# Patient Record
Sex: Male | Born: 1945
Health system: Southern US, Community
[De-identification: ages and names within clinical notes are randomized; demographics above are authoritative.]

## PROBLEM LIST (undated history)

## (undated) DIAGNOSIS — R42 Dizziness and giddiness: Secondary | ICD-10-CM

## (undated) DIAGNOSIS — I480 Paroxysmal atrial fibrillation: Secondary | ICD-10-CM

## (undated) DIAGNOSIS — I77819 Aortic ectasia, unspecified site: Secondary | ICD-10-CM

## (undated) DIAGNOSIS — I712 Thoracic aortic aneurysm, without rupture: Secondary | ICD-10-CM

## (undated) DIAGNOSIS — R1903 Right lower quadrant abdominal swelling, mass and lump: Secondary | ICD-10-CM

## (undated) DIAGNOSIS — S61219A Laceration without foreign body of unspecified finger without damage to nail, initial encounter: Secondary | ICD-10-CM

## (undated) DIAGNOSIS — Q430 Meckel's diverticulum (displaced) (hypertrophic): Secondary | ICD-10-CM

## (undated) DIAGNOSIS — N529 Male erectile dysfunction, unspecified: Secondary | ICD-10-CM

## (undated) DIAGNOSIS — E785 Hyperlipidemia, unspecified: Secondary | ICD-10-CM

## (undated) DIAGNOSIS — N400 Enlarged prostate without lower urinary tract symptoms: Secondary | ICD-10-CM

## (undated) DIAGNOSIS — I10 Essential (primary) hypertension: Secondary | ICD-10-CM

## (undated) DIAGNOSIS — I341 Nonrheumatic mitral (valve) prolapse: Secondary | ICD-10-CM

## (undated) DIAGNOSIS — N451 Epididymitis: Secondary | ICD-10-CM

## (undated) DIAGNOSIS — I77810 Thoracic aortic ectasia: Secondary | ICD-10-CM

## (undated) DIAGNOSIS — Z8679 Personal history of other diseases of the circulatory system: Secondary | ICD-10-CM

## (undated) DIAGNOSIS — I5189 Other ill-defined heart diseases: Secondary | ICD-10-CM

## (undated) DIAGNOSIS — Z9889 Other specified postprocedural states: Secondary | ICD-10-CM

## (undated) DIAGNOSIS — I519 Heart disease, unspecified: Secondary | ICD-10-CM

## (undated) HISTORY — DX: Right lower quadrant abdominal swelling, mass and lump: R19.03

## (undated) HISTORY — PX: APPENDECTOMY: SHX54

## (undated) HISTORY — DX: Other ill-defined heart diseases: I51.89

## (undated) HISTORY — DX: Heart disease, unspecified: I51.9

## (undated) HISTORY — DX: Dizziness and giddiness: R42

## (undated) HISTORY — PX: PILONIDAL CYST EXCISION: SHX744

## (undated) HISTORY — PX: TONSILLECTOMY: SUR1361

## (undated) HISTORY — PX: RECONSTRUCTION OF NOSE: SHX2301

## (undated) HISTORY — DX: Nonrheumatic mitral (valve) prolapse: I34.1

## (undated) HISTORY — DX: Essential (primary) hypertension: I10

## (undated) HISTORY — DX: Aortic ectasia, unspecified site: I77.819

## (undated) HISTORY — DX: Meckel's diverticulum (displaced) (hypertrophic): Q43.0

## (undated) HISTORY — DX: Benign prostatic hyperplasia without lower urinary tract symptoms: N40.0

## (undated) HISTORY — DX: Epididymitis: N45.1

## (undated) HISTORY — DX: Male erectile dysfunction, unspecified: N52.9

## (undated) HISTORY — DX: Thoracic aortic ectasia: I77.810

## (undated) HISTORY — DX: Thoracic aortic aneurysm, without rupture: I71.2

## (undated) HISTORY — DX: Paroxysmal atrial fibrillation: I48.0

---

## 2000-06-15 ENCOUNTER — Encounter: Admission: RE | Admit: 2000-06-15 | Discharge: 2000-06-15 | Payer: Self-pay | Admitting: Internal Medicine

## 2000-06-15 ENCOUNTER — Encounter: Payer: Self-pay | Admitting: Internal Medicine

## 2011-08-29 DIAGNOSIS — N529 Male erectile dysfunction, unspecified: Secondary | ICD-10-CM

## 2011-08-29 HISTORY — DX: Male erectile dysfunction, unspecified: N52.9

## 2012-07-10 ENCOUNTER — Emergency Department (HOSPITAL_COMMUNITY)
Admission: EM | Admit: 2012-07-10 | Discharge: 2012-07-10 | Disposition: A | Discharge status: Home / Self Care | Payer: BC Managed Care – PPO | Attending: Emergency Medicine | Admitting: Emergency Medicine

## 2012-07-10 ENCOUNTER — Emergency Department (HOSPITAL_COMMUNITY): Payer: BC Managed Care – PPO

## 2012-07-10 ENCOUNTER — Encounter (HOSPITAL_COMMUNITY): Payer: Self-pay | Admitting: Nurse Practitioner

## 2012-07-10 DIAGNOSIS — R109 Unspecified abdominal pain: Secondary | ICD-10-CM

## 2012-07-10 DIAGNOSIS — Z79899 Other long term (current) drug therapy: Secondary | ICD-10-CM | POA: Insufficient documentation

## 2012-07-10 DIAGNOSIS — R197 Diarrhea, unspecified: Secondary | ICD-10-CM | POA: Insufficient documentation

## 2012-07-10 DIAGNOSIS — Z8679 Personal history of other diseases of the circulatory system: Secondary | ICD-10-CM | POA: Insufficient documentation

## 2012-07-10 DIAGNOSIS — R1031 Right lower quadrant pain: Secondary | ICD-10-CM | POA: Insufficient documentation

## 2012-07-10 LAB — CBC WITH DIFFERENTIAL/PLATELET
Basophils Absolute: 0 10*3/uL (ref 0.0–0.1)
Basophils Relative: 0 % (ref 0–1)
Eosinophils Absolute: 0.2 10*3/uL (ref 0.0–0.7)
Eosinophils Relative: 1 % (ref 0–5)
HCT: 44.6 % (ref 39.0–52.0)
Hemoglobin: 15.7 g/dL (ref 13.0–17.0)
Lymphocytes Relative: 18 % (ref 12–46)
Lymphs Abs: 2 10*3/uL (ref 0.7–4.0)
MCH: 31 pg (ref 26.0–34.0)
MCHC: 35.2 g/dL (ref 30.0–36.0)
MCV: 88 fL (ref 78.0–100.0)
Monocytes Absolute: 0.7 10*3/uL (ref 0.1–1.0)
Monocytes Relative: 6 % (ref 3–12)
Neutro Abs: 8.3 10*3/uL — ABNORMAL HIGH (ref 1.7–7.7)
Neutrophils Relative %: 74 % (ref 43–77)
Platelets: 210 10*3/uL (ref 150–400)
RBC: 5.07 MIL/uL (ref 4.22–5.81)
RDW: 12.9 % (ref 11.5–15.5)
WBC: 11.1 10*3/uL — ABNORMAL HIGH (ref 4.0–10.5)

## 2012-07-10 LAB — COMPREHENSIVE METABOLIC PANEL
ALT: 15 U/L (ref 0–53)
AST: 17 U/L (ref 0–37)
Albumin: 3.8 g/dL (ref 3.5–5.2)
Alkaline Phosphatase: 59 U/L (ref 39–117)
BUN: 21 mg/dL (ref 6–23)
CO2: 22 mEq/L (ref 19–32)
Calcium: 9.3 mg/dL (ref 8.4–10.5)
Chloride: 102 mEq/L (ref 96–112)
Creatinine, Ser: 0.9 mg/dL (ref 0.50–1.35)
GFR calc Af Amer: >90 mL/min (ref >90)
GFR calc non Af Amer: 88 mL/min — ABNORMAL LOW (ref >90)
Glucose, Bld: 97 mg/dL (ref 70–99)
Potassium: 3.7 mEq/L (ref 3.5–5.1)
Sodium: 138 mEq/L (ref 135–145)
Total Bilirubin: 0.4 mg/dL (ref 0.3–1.2)
Total Protein: 6.7 g/dL (ref 6.0–8.3)

## 2012-07-10 LAB — URINALYSIS, MICROSCOPIC ONLY
Glucose, UA: NEGATIVE mg/dL
Ketones, ur: 15 mg/dL — AB
Leukocytes, UA: NEGATIVE
pH: 5 (ref 5.0–8.0)

## 2012-07-10 MED ORDER — IOHEXOL 300 MG/ML  SOLN
50.0000 mL | Freq: Once | INTRAMUSCULAR | Status: AC | PRN
  Administered 2012-07-10: 50 mL via ORAL

## 2012-07-10 MED ORDER — IOHEXOL 300 MG/ML  SOLN
100.0000 mL | Freq: Once | INTRAMUSCULAR | Status: AC | PRN
  Administered 2012-07-10: 100 mL via INTRAVENOUS

## 2012-07-10 MED ORDER — SODIUM CHLORIDE 0.9 % IV BOLUS (SEPSIS)
1000.0000 mL | Freq: Once | INTRAVENOUS | Status: AC
  Administered 2012-07-10: 1000 mL via INTRAVENOUS

## 2012-07-10 NOTE — ED Provider Notes (Signed)
History     CSN: [id number]  Arrival date & time 07/10/12  1554   First MD Initiated Contact with Patient 07/10/12 1646      Chief Complaint  Patient presents with  . Abdominal Pain    (Consider location/radiation/quality/duration/timing/severity/associated sxs/prior treatment) HPI chief complaint: Abdominal pain. Onset: On waking. Location right lower quadrant. Not improved or worsened by anything. Cortical. No radiation. Severity mild/moderate. Timing constant. For signs and symptoms to review systems. For social history see nurse's notes. No family history of appendicitis, nephrolithiasis or cholecystitis. I reviewed the patient's past medical, past surgical, social history as well as medications and allergies to  Past Medical History  Diagnosis Date  . Mitral valve prolapse     Past Surgical History  Procedure Date  . Tonsillectomy     History reviewed. No pertinent family history.  History  Substance Use Topics  . Smoking status: Never Smoker   . Smokeless tobacco: Not on file  . Alcohol Use: Yes      Review of Systems  Constitutional: Negative for fever and chills.  HENT: Negative for neck pain and neck stiffness.   Eyes: Negative for visual disturbance.  Respiratory: Negative for cough, chest tightness and shortness of breath.   Cardiovascular: Negative for chest pain, palpitations and leg swelling.  Gastrointestinal: Positive for abdominal pain and diarrhea. Negative for nausea, vomiting, constipation, blood in stool and abdominal distention.  Genitourinary: Negative for dysuria, urgency, hematuria, flank pain, decreased urine volume, penile swelling, scrotal swelling, difficulty urinating, penile pain and testicular pain.  Musculoskeletal: Negative for back pain and gait problem.  Skin: Negative for rash.  Neurological: Negative for dizziness, tremors, seizures, syncope, facial asymmetry, speech difficulty, weakness, light-headedness, numbness and headaches.    Hematological: Negative for adenopathy. Does not bruise/bleed easily.  Psychiatric/Behavioral: Negative for confusion.    Allergies  Sulfa antibiotics  Home Medications   Current Outpatient Rx  Name  Route  Sig  Dispense  Refill  . ASPIRIN EC 81 MG PO TBEC   Oral   Take 81 mg by mouth daily.         . IBUPROFEN 200 MG PO TABS   Oral   Take 400 mg by mouth every 6 (six) hours as needed. For pain           BP 142/88  Pulse 67  Temp 98.1 F (36.7 C) (Oral)  Resp 16  SpO2 97%  Physical Exam  Constitutional: He is oriented to person, place, and time. He appears well-developed and well-nourished. No distress.  HENT:  Head: Normocephalic.  Eyes: Conjunctivae normal are normal.  Neck: Normal range of motion. Neck supple.  Cardiovascular: Normal rate, regular rhythm, normal heart sounds and intact distal pulses.   No murmur heard. Pulmonary/Chest: Effort normal and breath sounds normal. No respiratory distress. He has no wheezes. He has no rales.  Abdominal: Soft. Bowel sounds are normal. He exhibits no distension and no mass. There is tenderness. There is no rebound and no guarding. Hernia confirmed negative in the right inguinal area and confirmed negative in the left inguinal area.  Genitourinary: Testes normal and penis normal. Cremasteric reflex is present. Right testis shows no mass, no swelling and no tenderness. Right testis is descended. Cremasteric reflex is not absent on the right side. Left testis shows no mass, no swelling and no tenderness. Left testis is descended. Cremasteric reflex is not absent on the left side. Uncircumcised.  Musculoskeletal: Normal range of motion. He exhibits no edema and  no tenderness.  Lymphadenopathy:       Right: No inguinal adenopathy present.       Left: No inguinal adenopathy present.  Neurological: He is alert and oriented to person, place, and time.  Skin: Skin is warm and dry. He is not diaphoretic.  Psychiatric: He has a  normal mood and affect.    ED Course  Procedures (including critical care time)  Labs Reviewed  CBC WITH DIFFERENTIAL - Abnormal; Notable for the following:    WBC 11.1 (*)     Neutro Abs 8.3 (*)     All other components within normal limits  COMPREHENSIVE METABOLIC PANEL - Abnormal; Notable for the following:    GFR calc non Af Amer 88 (*)     All other components within normal limits  URINALYSIS, MICROSCOPIC ONLY - Abnormal; Notable for the following:    Color, Urine AMBER (*)  BIOCHEMICALS MAY BE AFFECTED BY COLOR   Specific Gravity, Urine 1.041 (*)     Bilirubin Urine SMALL (*)     Ketones, ur 15 (*)     Crystals CA OXALATE CRYSTALS (*)     All other components within normal limits  LIPASE, BLOOD   Ct Abdomen Pelvis W Contrast  07/10/2012  *RADIOLOGY REPORT*  Clinical Data: Abdominal pain, right lower quadrant pain, concern for appendicitis  CT ABDOMEN AND PELVIS WITH CONTRAST  Technique:  Multidetector CT imaging of the abdomen and pelvis was performed following the standard protocol during bolus administration of intravenous contrast.  Contrast: OMNIPAQUE IOHEXOL 300 MG/ML  SOLN  Comparison: None.  Findings: Lung bases are clear.  No pericardial fluid.  There is a low density lesion in the left hepatic lobe with simple fluid attenuation (image 10) with hepatic cyst.  There is smaller likely hepatic cyst within the right lobe (image 25) which cannot be characterized.  The gallbladder is collapsed without inflammation.  The pancreas, spleen, adrenal glands, and left kidney are normal.  There is a low density  cortical lesion in the right kidney with simple fluid attenuation consistent with benign cyst.  The stomach and duodenum are normal.  There is no evidence of small bowel dilatation.  Contrast enters the ascending colon.  The ileocecal valve is normal.  The appendix is identified and demonstrates no inflammation (image 45).  Within the right lower quadrant there is a small soft  tissue nodule within the small bowel mesentery measuring 17 x 23 mm (image 46).  This has a central calcification versus focus of enhancement or intraluminal contrast. The lesion is not clearly contiguous with the bowel.  No additional mesenteric lesions are identified.  The ascending colon is normal. The sigmoid colon contains several diverticula.  The rectum is normal.  Abdominal aorta is normal caliber.  No retroperitoneal periportal lymphadenopathy.  No free fluid the pelvis.  The prostate gland bladder normal.  No pelvic lymphadenopathy. Review of  bone windows demonstrates no aggressive osseous lesions.  There is significant degenerative change of the lumbar spine with endplate spurring and sclerosis. There is benign-appearing sclerotic lesion in the right acetabulum.  IMPRESSION:  1.  No clear explanation for right lower quadrant pain.  No evidence of appendicitis. 2.  There is a small nodule/mass within the right lower quadrant small bowel mesentery. Differential would include neuroendocrine tumor versus a diverticulum.  Recommend correlation with a appropriate tumor markers for neuroendocrine tumor consider non emergent oncology / GI consultation.  3.  Benign-appearing hepatic cyst. 4.  Bosniak one  cyst of the right kidney.   Original Report Authenticated By: Genevive Bi, M.D.      1. Abdominal pain       MDM  Pt is a  67 year old male with 10 hours of focal right lower quadrant abdominal pain with mild nonbloody diarrhea. Afebrile no vomiting. Vital stable. Labs and CT pending. Pain is 6/10 but the patient does not want pain medicine at this time.  CT scan negative for acute right lower quadrant pathology. Repeat abdominal exam benign. Patient continues to appear well. CT scan does comment on a possible neuroendocrine tumor versus a diverticulum. They recommend correlation with tumor markers and palpation oncology or GI consultation. Patient was instructed to followup with his primary care  physician who can arrange this. Return precautions discussed including fever, worsening abdominal pain, vomiting, diarrhea. Patient verbalized understanding.         Consuello Masse, MD 07/10/12 2300

## 2012-07-10 NOTE — ED Notes (Signed)
Pt finished drinking PO contrast.

## 2012-07-10 NOTE — ED Notes (Signed)
Pt states understanding of discharge instructions 

## 2012-07-10 NOTE — ED Notes (Signed)
Pt c/o RLQ abd pain onset this am, describes as sharp and increased with inhalation. Reports diarrhea x 1 this am. Denies n/v. A&Ox4, resp e/u

## 2012-07-10 NOTE — ED Notes (Signed)
CT made aware pt finished drinking contrast

## 2012-07-10 NOTE — ED Provider Notes (Signed)
I saw and evaluated the patient, reviewed the resident's note and I agree with the findings and plan.   .Face to face Exam:  General:  Awake HEENT:  Atraumatic Resp:  Normal effort Abd:  Nondistended Neuro:No focal weakness Lymph: No adenopathy   Nelia Shi, MD 07/10/12 [phone]

## 2012-07-27 ENCOUNTER — Encounter (INDEPENDENT_AMBULATORY_CARE_PROVIDER_SITE_OTHER): Payer: Self-pay | Admitting: General Surgery

## 2012-07-27 ENCOUNTER — Telehealth (INDEPENDENT_AMBULATORY_CARE_PROVIDER_SITE_OTHER): Payer: Self-pay | Admitting: General Surgery

## 2012-07-27 ENCOUNTER — Ambulatory Visit (INDEPENDENT_AMBULATORY_CARE_PROVIDER_SITE_OTHER): Payer: BC Managed Care – PPO | Admitting: General Surgery

## 2012-07-27 ENCOUNTER — Other Ambulatory Visit (INDEPENDENT_AMBULATORY_CARE_PROVIDER_SITE_OTHER): Payer: Self-pay | Admitting: General Surgery

## 2012-07-27 VITALS — BP 160/68 | HR 68 | Temp 97.7°F | Resp 16 | Ht 72.0 in | Wt 202.6 lb

## 2012-07-27 DIAGNOSIS — R1903 Right lower quadrant abdominal swelling, mass and lump: Secondary | ICD-10-CM

## 2012-07-27 HISTORY — DX: Right lower quadrant abdominal swelling, mass and lump: R19.03

## 2012-07-27 NOTE — Telephone Encounter (Signed)
Discussed plan

## 2012-07-27 NOTE — Assessment & Plan Note (Signed)
Diverticulitis versus carcinoid tumor.  This may represent a Meckel's diverticulum. It did seem to improve significantly with the antibiotics which makes me favor diverticulitis of the small bowel as the diagnosis.  We will set him up for laparoscopic exploration and probable diverticulectomy. I advised the patient that if this is a small bowel tumor we would take out a segment of the small bowel and put it back together through a small incision.  And repeat a CT scan prior to surgery to make sure that this has not completely resolved. It may have represented epiploic appendicitis.  I reviewed the risk of surgery including bleeding, infection, damage to adjacent structures, and unexpected findings.

## 2012-07-27 NOTE — Progress Notes (Signed)
Patient ID: Chase L Constancio Jr., male   DOB: 07/11/1945, 66 y.o.   MRN: [id number]  Chief Complaint  Patient presents with  . New Evaluation    eval mesentric mass    HPI Chase L Farnworth Jr. is a 66 y.o. male.   HPI Patient is a 66  Year-old male who presented January 11 with abdominal pain. This was quite sharp it was in the right lower corner. He was concerned that he had appendicitis. In the emergency department CT was negative for appendicitis, but it did show a mass in the right lower quadrant. He was started on antibiotics. He stated that within the first 24-hours, he felt much better.  He continues to have a small nagging right discomfort in the right lower quadrant especially when he is moving or stretching.  He says he feels like something is pulling in that area.  He denies fevers or chills. He denies diarrhea or constipation. He has not had any flushing. He does have occasional headaches but these are unchanged. His father had a family history significant for pancreatic cancer, and his mother had breast cancer.  Past Medical History  Diagnosis Date  . Mitral valve prolapse   . Allergic rhinitis   . Uvulitis 11/2000    acute - responded to augmentin  . Vertigo     episodic mild positional  . Seborrheic keratosis   . Actinic keratoses   . Epididymitis     recurrent  . Mitral valve prolapse 12/1993    no murmur  . BPH (benign prostatic hyperplasia)     mild  . Sigmoid diverticulosis 08/1999    noted on flexible sigmoidoscopy   . Folliculitis   . Heart murmur, systolic 05/2009    aortic and mitral  . Hypertension     in the past - resolved  . Abdominal pain   . Generalized headaches     slight, dull    Past Surgical History  Procedure Date  . Tonsillectomy   . Pilonidal cyst excision   . Reconstruction of nose     secondary to deviated septum    Family History  Problem Relation Age of Onset  . Cancer Mother     breast  . Cancer Father     pancreatic  . Cancer  Maternal Aunt     ? ovarian  . Cancer Maternal Uncle     ? pancreatic  . Cancer Paternal Uncle     ? lung    Social History History  Substance Use Topics  . Smoking status: Never Smoker   . Smokeless tobacco: Never Used  . Alcohol Use: Yes     Comment: one drink per day (wine or beer)    Allergies  Allergen Reactions  . Sulfa Antibiotics Other (See Comments)    Severe lethargy  . Ace Inhibitors Cough  . Antihistamines, Chlorpheniramine-Type Other (See Comments)    Trouble urinating. Confirm type of antihistamine with the patient.     Current Outpatient Prescriptions  Medication Sig Dispense Refill  . aspirin EC 81 MG tablet Take 81 mg by mouth daily.      . ibuprofen (ADVIL,MOTRIN) 200 MG tablet Take 400 mg by mouth every 6 (six) hours as needed. For pain        Review of Systems Review of Systems  Gastrointestinal: Positive for abdominal pain.  Skin: Positive for wound.  Neurological: Positive for headaches.    Blood pressure 160/68, pulse 68, temperature 97.7 F (36.5 C), temperature   source Oral, resp. rate 16, height 6' (1.829 m), weight 202 lb 9.6 oz (91.899 kg).  Physical Exam Physical Exam  Constitutional: He is oriented to person, place, and time. He appears well-developed and well-nourished. No distress.  HENT:  Head: Normocephalic and atraumatic.  Right Ear: External ear normal.  Left Ear: External ear normal.  Mouth/Throat: No oropharyngeal exudate.  Eyes: Pupils are equal, round, and reactive to light. Right eye exhibits no discharge. Left eye exhibits no discharge. No scleral icterus.  Neck: Normal range of motion. Neck supple. No tracheal deviation present. No thyromegaly present.  Cardiovascular: Normal rate, regular rhythm, normal heart sounds and intact distal pulses.  Exam reveals no gallop and no friction rub.   No murmur heard. Pulmonary/Chest: Effort normal and breath sounds normal. No respiratory distress. He has no wheezes. He has no rales.  He exhibits no tenderness.  Abdominal: Soft. Bowel sounds are normal. He exhibits no distension and no mass. There is no tenderness. There is no rebound and no guarding.  Musculoskeletal: Normal range of motion. He exhibits no edema and no tenderness.  Lymphadenopathy:    He has no cervical adenopathy.  Neurological: He is alert and oriented to person, place, and time. Coordination normal.  Skin: Skin is warm and dry. No rash noted. He is not diaphoretic. No erythema. No pallor.  Psychiatric: He has a normal mood and affect. His behavior is normal. Judgment and thought content normal.    Data Reviewed CT There is a low density lesion in the left hepatic lobe with simple  fluid attenuation (image 10) with hepatic cyst. There is smaller  likely hepatic cyst within the right lobe (image 25) which cannot  be characterized. The gallbladder is collapsed without  inflammation. The pancreas, spleen, adrenal glands, and left  kidney are normal. There is a low density cortical lesion in the  right kidney with simple fluid attenuation consistent with benign  cyst.  The stomach and duodenum are normal. There is no evidence of small  bowel dilatation. Contrast enters the ascending colon. The  ileocecal valve is normal. The appendix is identified and  demonstrates no inflammation (image 45). Within the right lower  quadrant there is a small soft tissue nodule within the small bowel  mesentery measuring 17 x 23 mm (image 46). This has a central  calcification versus focus of enhancement or intraluminal contrast.  The lesion is not clearly contiguous with the bowel. No additional  mesenteric lesions are identified. The ascending colon is normal.  The sigmoid colon contains several diverticula. The rectum is  normal.  Abdominal aorta is normal caliber. No retroperitoneal periportal  lymphadenopathy.  No free fluid the pelvis. The prostate gland bladder normal. No  pelvic lymphadenopathy. Review of  bone windows demonstrates no  aggressive osseous lesions. There is significant degenerative  change of the lumbar spine with endplate spurring and sclerosis.  There is benign-appearing sclerotic lesion in the right acetabulum.  IMPRESSION:  1. No clear explanation for right lower quadrant pain. No  evidence of appendicitis.  2. There is a small nodule/mass within the right lower quadrant  small bowel mesentery. Differential would include neuroendocrine  tumor versus a diverticulum. Recommend correlation with a  appropriate tumor markers for neuroendocrine tumor consider non  emergent oncology / GI consultation.  3. Benign-appearing hepatic cyst.  4. Bosniak one cyst of the right kidney.   Assessment/Plan    Abdominal mass, RLQ (right lower quadrant) Diverticulitis versus carcinoid tumor.  This may represent   a Meckel's diverticulum. It did seem to improve significantly with the antibiotics which makes me favor diverticulitis of the small bowel as the diagnosis.  We will set him up for laparoscopic exploration and probable diverticulectomy. I advised the patient that if this is a small bowel tumor we would take out a segment of the small bowel and put it back together through a small incision.  And repeat a CT scan prior to surgery to make sure that this has not completely resolved. It may have represented epiploic appendicitis.  I reviewed the risk of surgery including bleeding, infection, damage to adjacent structures, and unexpected findings.   Discussed with daughter who is fam med/ED  Jorgia Manthei 07/27/2012, 12:38 PM    

## 2012-07-27 NOTE — Patient Instructions (Signed)
Risks of surgery include bleeding, infection, damage to adjacent structures, findings other than suspected small bowel tumor.    Will order CT abd/pelvis to eval prior to surgery.

## 2012-07-30 ENCOUNTER — Encounter (HOSPITAL_COMMUNITY): Payer: Self-pay | Admitting: Pharmacy Technician

## 2012-08-02 ENCOUNTER — Telehealth (INDEPENDENT_AMBULATORY_CARE_PROVIDER_SITE_OTHER): Payer: Self-pay | Admitting: General Surgery

## 2012-08-02 NOTE — Telephone Encounter (Signed)
Pt called to ask what labs he needs for surgery on 08/11/12, as he is visiting his PCP and will have it drawn there.  Advised he needs a CBC and CMET.  Pt wrote this down to give to his PCP.

## 2012-08-03 ENCOUNTER — Encounter (HOSPITAL_COMMUNITY): Payer: Self-pay

## 2012-08-03 ENCOUNTER — Encounter (HOSPITAL_COMMUNITY)
Admission: RE | Admit: 2012-08-03 | Discharge: 2012-08-03 | Disposition: A | Discharge status: Home / Self Care | Payer: BC Managed Care – PPO | Source: Ambulatory Visit | Attending: General Surgery | Admitting: General Surgery

## 2012-08-03 ENCOUNTER — Ambulatory Visit (HOSPITAL_COMMUNITY)
Admission: RE | Admit: 2012-08-03 | Discharge: 2012-08-03 | Disposition: A | Discharge status: Home / Self Care | Payer: BC Managed Care – PPO | Source: Ambulatory Visit | Attending: General Surgery | Admitting: General Surgery

## 2012-08-03 DIAGNOSIS — R05 Cough: Secondary | ICD-10-CM | POA: Insufficient documentation

## 2012-08-03 DIAGNOSIS — I059 Rheumatic mitral valve disease, unspecified: Secondary | ICD-10-CM | POA: Insufficient documentation

## 2012-08-03 DIAGNOSIS — R059 Cough, unspecified: Secondary | ICD-10-CM | POA: Insufficient documentation

## 2012-08-03 DIAGNOSIS — Z01818 Encounter for other preprocedural examination: Secondary | ICD-10-CM | POA: Insufficient documentation

## 2012-08-03 DIAGNOSIS — Z01812 Encounter for preprocedural laboratory examination: Secondary | ICD-10-CM | POA: Insufficient documentation

## 2012-08-03 DIAGNOSIS — R1900 Intra-abdominal and pelvic swelling, mass and lump, unspecified site: Secondary | ICD-10-CM | POA: Insufficient documentation

## 2012-08-03 LAB — SURGICAL PCR SCREEN
MRSA, PCR: INVALID — AB
Staphylococcus aureus: INVALID — AB

## 2012-08-03 NOTE — Patient Instructions (Addendum)
20 Chase Smith.  08/03/2012   Your procedure is scheduled on: 2-12  -2014  Report to [facility] at    1100    AM.  Call this number if you have problems the morning of surgery: [phone]  Or Presurgical Testing [phone](Cay Kath)   Remember: Follow any bowel prep instructions per MD office.  Do not eat food:After Midnight.  May have clear liquids:up to 6 Hours before arrival. Nothing after :0700 AM  Clear liquids include soda, tea, black coffee, apple or grape juice, broth.  Take these medicines the morning of surgery with A SIP OF WATER: none   Do not wear jewelry, make-up or nail polish.  Do not wear lotions, powders, or perfumes. You may wear deodorant.  Do not shave 12 hours prior to first CHG shower(legs and under arms).(face and neck okay.)  Do not bring valuables to the hospital.  Contacts, dentures or bridgework,body piercing,  may not be worn into surgery.  Leave suitcase in the car. After surgery it may be brought to your room.  For patients admitted to the hospital, checkout time is 11:00 AM the day of discharge.   Patients discharged the day of surgery will not be allowed to drive home. Must have responsible person with you x 24 hours once discharged.  Name and phone number of your driver: Chase Smith, s[pouse [phone] cell  Special Instructions: CHG Shower Use Special Wash: see special instructions.(avoid face and genitals)   Please read over the following fact sheets that you were given: MRSA Information, Blood Transfusion fact sheet.    Failure to follow these instructions may result in Cancellation of your surgery.   Patient signature_______________________________________________________

## 2012-08-04 ENCOUNTER — Other Ambulatory Visit: Payer: BC Managed Care – PPO

## 2012-08-05 NOTE — Pre-Procedure Instructions (Signed)
08-05-12 labs CBC/d, CMP, U/A,Lipid panel, EKG done Dr. Kevan Ny( 08-04-12) received and placed with chart. W. Kennon Portela

## 2012-08-06 ENCOUNTER — Encounter (INDEPENDENT_AMBULATORY_CARE_PROVIDER_SITE_OTHER): Payer: Self-pay

## 2012-08-06 ENCOUNTER — Ambulatory Visit
Admission: RE | Admit: 2012-08-06 | Discharge: 2012-08-06 | Disposition: A | Discharge status: Home / Self Care | Payer: BC Managed Care – PPO | Source: Ambulatory Visit | Attending: General Surgery | Admitting: General Surgery

## 2012-08-06 LAB — MRSA CULTURE

## 2012-08-06 MED ORDER — IOHEXOL 300 MG/ML  SOLN
100.0000 mL | Freq: Once | INTRAMUSCULAR | Status: AC | PRN
  Administered 2012-08-06: 100 mL via INTRAVENOUS

## 2012-08-10 ENCOUNTER — Telehealth (INDEPENDENT_AMBULATORY_CARE_PROVIDER_SITE_OTHER): Payer: Self-pay

## 2012-08-10 NOTE — Telephone Encounter (Signed)
Pt was given culture results of negative for MRSA and Staph.  He wanted to know if his surgery was still on for 08/11/12.  His CT scan results showed no metastatic disease.  If he does not hear from Dr. Donell Beers his surgery will still take place.

## 2012-08-11 ENCOUNTER — Encounter (HOSPITAL_COMMUNITY): Payer: Self-pay | Admitting: *Deleted

## 2012-08-11 ENCOUNTER — Encounter (HOSPITAL_COMMUNITY): Payer: Self-pay | Admitting: Anesthesiology

## 2012-08-11 ENCOUNTER — Encounter (HOSPITAL_COMMUNITY)
Admission: RE | Disposition: A | Discharge status: Home / Self Care | Payer: Self-pay | Source: Ambulatory Visit | Attending: General Surgery

## 2012-08-11 ENCOUNTER — Ambulatory Visit (HOSPITAL_COMMUNITY): Payer: BC Managed Care – PPO | Admitting: Anesthesiology

## 2012-08-11 ENCOUNTER — Telehealth (INDEPENDENT_AMBULATORY_CARE_PROVIDER_SITE_OTHER): Payer: Self-pay | Admitting: General Surgery

## 2012-08-11 ENCOUNTER — Ambulatory Visit (HOSPITAL_COMMUNITY)
Admission: RE | Admit: 2012-08-11 | Discharge: 2012-08-12 | Disposition: A | Discharge status: Home / Self Care | Payer: BC Managed Care – PPO | Source: Ambulatory Visit | Attending: General Surgery | Admitting: General Surgery

## 2012-08-11 DIAGNOSIS — R1903 Right lower quadrant abdominal swelling, mass and lump: Secondary | ICD-10-CM | POA: Insufficient documentation

## 2012-08-11 DIAGNOSIS — Q430 Meckel's diverticulum (displaced) (hypertrophic): Secondary | ICD-10-CM | POA: Insufficient documentation

## 2012-08-11 DIAGNOSIS — Z800 Family history of malignant neoplasm of digestive organs: Secondary | ICD-10-CM | POA: Insufficient documentation

## 2012-08-11 DIAGNOSIS — Z7982 Long term (current) use of aspirin: Secondary | ICD-10-CM | POA: Insufficient documentation

## 2012-08-11 DIAGNOSIS — Z803 Family history of malignant neoplasm of breast: Secondary | ICD-10-CM | POA: Insufficient documentation

## 2012-08-11 HISTORY — PX: LAPAROSCOPIC SMALL BOWEL RESECTION: SHX5929

## 2012-08-11 HISTORY — PX: LAPAROSCOPIC APPENDECTOMY: SHX408

## 2012-08-11 LAB — CBC
HCT: 43.2 % (ref 39.0–52.0)
MCV: 88.5 fL (ref 78.0–100.0)
RDW: 12.8 % (ref 11.5–15.5)
WBC: 8.3 10*3/uL (ref 4.0–10.5)

## 2012-08-11 LAB — TYPE AND SCREEN

## 2012-08-11 LAB — CREATININE, SERUM: GFR calc Af Amer: >90 mL/min (ref >90)

## 2012-08-11 SURGERY — EXCISION, SMALL INTESTINE, LAPAROSCOPIC
Anesthesia: General | Site: Abdomen | Wound class: Clean Contaminated

## 2012-08-11 MED ORDER — FENTANYL CITRATE 0.05 MG/ML IJ SOLN
INTRAMUSCULAR | Status: DC | PRN
  Administered 2012-08-11 (×2): 50 ug via INTRAVENOUS
  Administered 2012-08-11: 100 ug via INTRAVENOUS

## 2012-08-11 MED ORDER — CEFOXITIN SODIUM-DEXTROSE 1-4 GM-% IV SOLR (PREMIX)
INTRAVENOUS | Status: AC
  Filled 2012-08-11: qty 100

## 2012-08-11 MED ORDER — ENOXAPARIN SODIUM 40 MG/0.4ML ~~LOC~~ SOLN
40.0000 mg | SUBCUTANEOUS | Status: DC
  Administered 2012-08-12: 40 mg via SUBCUTANEOUS
  Filled 2012-08-11: qty 0.4

## 2012-08-11 MED ORDER — DIPHENHYDRAMINE HCL 50 MG/ML IJ SOLN: 12.5000 mg | Freq: Four times a day (QID) | INTRAMUSCULAR | Status: DC | PRN

## 2012-08-11 MED ORDER — LIDOCAINE HCL 1 % IJ SOLN
INTRAMUSCULAR | Status: AC
  Filled 2012-08-11: qty 20

## 2012-08-11 MED ORDER — ROCURONIUM BROMIDE 100 MG/10ML IV SOLN
INTRAVENOUS | Status: DC | PRN
  Administered 2012-08-11: 40 mg via INTRAVENOUS

## 2012-08-11 MED ORDER — FLUTICASONE PROPIONATE 50 MCG/ACT NA SUSP
1.0000 | Freq: Every day | NASAL | Status: DC
  Filled 2012-08-11: qty 16

## 2012-08-11 MED ORDER — PROPOFOL 10 MG/ML IV BOLUS
INTRAVENOUS | Status: DC | PRN
  Administered 2012-08-11: 150 mg via INTRAVENOUS

## 2012-08-11 MED ORDER — NEOSTIGMINE METHYLSULFATE 1 MG/ML IJ SOLN
INTRAMUSCULAR | Status: DC | PRN
  Administered 2012-08-11: 4 mg via INTRAVENOUS

## 2012-08-11 MED ORDER — HYDROCODONE-ACETAMINOPHEN 5-325 MG PO TABS: 1.0000 | ORAL_TABLET | ORAL | Status: DC | PRN

## 2012-08-11 MED ORDER — MORPHINE SULFATE 2 MG/ML IJ SOLN: 1.0000 mg | INTRAMUSCULAR | Status: DC | PRN

## 2012-08-11 MED ORDER — LACTATED RINGERS IV SOLN: INTRAVENOUS | Status: DC

## 2012-08-11 MED ORDER — DEXTROSE 5 % IV SOLN
2.0000 g | INTRAVENOUS | Status: AC
  Administered 2012-08-11: 2 g via INTRAVENOUS
  Filled 2012-08-11: qty 2

## 2012-08-11 MED ORDER — ONDANSETRON HCL 4 MG PO TABS: 4.0000 mg | ORAL_TABLET | Freq: Four times a day (QID) | ORAL | Status: DC | PRN

## 2012-08-11 MED ORDER — LIDOCAINE HCL (CARDIAC) 20 MG/ML IV SOLN
INTRAVENOUS | Status: DC | PRN
  Administered 2012-08-11: 60 mg via INTRAVENOUS

## 2012-08-11 MED ORDER — BUPIVACAINE-EPINEPHRINE 0.25% -1:200000 IJ SOLN
INTRAMUSCULAR | Status: DC | PRN
  Administered 2012-08-11: 10 mL

## 2012-08-11 MED ORDER — LIDOCAINE-EPINEPHRINE 1 %-1:100000 IJ SOLN
INTRAMUSCULAR | Status: AC
  Filled 2012-08-11: qty 1

## 2012-08-11 MED ORDER — LIDOCAINE HCL (PF) 1 % IJ SOLN
INTRAMUSCULAR | Status: DC | PRN
  Administered 2012-08-11: 10 mL

## 2012-08-11 MED ORDER — BUPIVACAINE HCL (PF) 0.25 % IJ SOLN
INTRAMUSCULAR | Status: AC
  Filled 2012-08-11: qty 30

## 2012-08-11 MED ORDER — KCL IN DEXTROSE-NACL 20-5-0.45 MEQ/L-%-% IV SOLN
INTRAVENOUS | Status: DC
  Administered 2012-08-11 – 2012-08-12 (×2): via INTRAVENOUS
  Filled 2012-08-11 (×4): qty 1000

## 2012-08-11 MED ORDER — KETOROLAC TROMETHAMINE 30 MG/ML IJ SOLN
INTRAMUSCULAR | Status: AC
Start: 2012-08-11 — End: 2012-08-12
  Filled 2012-08-11: qty 1

## 2012-08-11 MED ORDER — HYDROMORPHONE HCL PF 1 MG/ML IJ SOLN: 0.2500 mg | INTRAMUSCULAR | Status: DC | PRN

## 2012-08-11 MED ORDER — LACTATED RINGERS IV SOLN
INTRAVENOUS | Status: DC
  Administered 2012-08-11: 1000 mL via INTRAVENOUS

## 2012-08-11 MED ORDER — KETOROLAC TROMETHAMINE 30 MG/ML IJ SOLN
30.0000 mg | Freq: Four times a day (QID) | INTRAMUSCULAR | Status: AC
  Administered 2012-08-11 – 2012-08-12 (×4): 30 mg via INTRAVENOUS
  Filled 2012-08-11 (×3): qty 1

## 2012-08-11 MED ORDER — DEXTROSE 5 % IV SOLN
1.0000 g | Freq: Four times a day (QID) | INTRAVENOUS | Status: AC
  Administered 2012-08-11 – 2012-08-12 (×3): 1 g via INTRAVENOUS
  Filled 2012-08-11 (×4): qty 1

## 2012-08-11 MED ORDER — MIDAZOLAM HCL 5 MG/5ML IJ SOLN
INTRAMUSCULAR | Status: DC | PRN
  Administered 2012-08-11: 2 mg via INTRAVENOUS

## 2012-08-11 MED ORDER — GLYCOPYRROLATE 0.2 MG/ML IJ SOLN
INTRAMUSCULAR | Status: DC | PRN
  Administered 2012-08-11: .6 mg via INTRAVENOUS

## 2012-08-11 MED ORDER — ACETAMINOPHEN 10 MG/ML IV SOLN
INTRAVENOUS | Status: DC | PRN
  Administered 2012-08-11: 1000 mg via INTRAVENOUS

## 2012-08-11 MED ORDER — ONDANSETRON HCL 4 MG/2ML IJ SOLN
INTRAMUSCULAR | Status: DC | PRN
  Administered 2012-08-11: 4 mg via INTRAVENOUS

## 2012-08-11 MED ORDER — ACETAMINOPHEN 10 MG/ML IV SOLN
INTRAVENOUS | Status: AC
  Filled 2012-08-11: qty 100

## 2012-08-11 MED ORDER — BUPIVACAINE-EPINEPHRINE PF 0.25-1:200000 % IJ SOLN
INTRAMUSCULAR | Status: AC
  Filled 2012-08-11: qty 90

## 2012-08-11 MED ORDER — ONDANSETRON HCL 4 MG/2ML IJ SOLN: 4.0000 mg | Freq: Four times a day (QID) | INTRAMUSCULAR | Status: DC | PRN

## 2012-08-11 MED ORDER — ACETAMINOPHEN 325 MG PO TABS: 650.0000 mg | ORAL_TABLET | ORAL | Status: DC | PRN

## 2012-08-11 MED ORDER — IBUPROFEN 800 MG PO TABS: 400.0000 mg | ORAL_TABLET | Freq: Four times a day (QID) | ORAL | Status: DC | PRN

## 2012-08-11 MED ORDER — LACTATED RINGERS IV SOLN
INTRAVENOUS | Status: DC | PRN
  Administered 2012-08-11: 12:00:00 via INTRAVENOUS

## 2012-08-11 SURGICAL SUPPLY — 73 items
APPLIER CLIP 5 13 M/L LIGAMAX5 (MISCELLANEOUS)
APPLIER CLIP ROT 10 11.4 M/L (STAPLE) ×2
APR CLP MED LRG 11.4X10 (STAPLE) ×1
APR CLP MED LRG 5 ANG JAW (MISCELLANEOUS)
BLADE EXTENDED COATED 6.5IN (ELECTRODE) ×2 IMPLANT
BLADE HEX COATED 2.75 (ELECTRODE) ×2 IMPLANT
BLADE SURG SZ10 CARB STEEL (BLADE) ×2 IMPLANT
CABLE HIGH FREQUENCY MONO STRZ (ELECTRODE) IMPLANT
CANISTER SUCTION 2500CC (MISCELLANEOUS) ×2 IMPLANT
CANNULA ENDOPATH XCEL 11M (ENDOMECHANICALS) IMPLANT
CELLS DAT CNTRL 66122 CELL SVR (MISCELLANEOUS) IMPLANT
CHLORAPREP W/TINT 26ML (MISCELLANEOUS) ×3 IMPLANT
CLIP APPLIE 5 13 M/L LIGAMAX5 (MISCELLANEOUS) IMPLANT
CLIP APPLIE ROT 10 11.4 M/L (STAPLE) IMPLANT
CLOTH BEACON ORANGE TIMEOUT ST (SAFETY) ×2 IMPLANT
COVER MAYO STAND STRL (DRAPES) ×2 IMPLANT
DECANTER SPIKE VIAL GLASS SM (MISCELLANEOUS) ×2 IMPLANT
DISSECTOR BLUNT TIP ENDO 5MM (MISCELLANEOUS) IMPLANT
DRAPE LAPAROSCOPIC ABDOMINAL (DRAPES) IMPLANT
DRAPE LG THREE QUARTER DISP (DRAPES) IMPLANT
DRAPE UTILITY XL STRL (DRAPES) ×6 IMPLANT
DRAPE WARM FLUID 44X44 (DRAPE) ×2 IMPLANT
ELECT REM PT RETURN 9FT ADLT (ELECTROSURGICAL) ×2
ELECTRODE REM PT RTRN 9FT ADLT (ELECTROSURGICAL) ×1 IMPLANT
FILTER SMOKE EVAC LAPAROSHD (FILTER) IMPLANT
GLOVE BIO SURGEON STRL SZ 6 (GLOVE) ×6 IMPLANT
GLOVE BIOGEL PI IND STRL 6.5 (GLOVE) ×1 IMPLANT
GLOVE BIOGEL PI IND STRL 7.0 (GLOVE) ×1 IMPLANT
GLOVE BIOGEL PI INDICATOR 6.5 (GLOVE) ×1
GLOVE BIOGEL PI INDICATOR 7.0 (GLOVE) ×1
GLOVE INDICATOR 6.5 STRL GRN (GLOVE) ×4 IMPLANT
GOWN PREVENTION PLUS XLARGE (GOWN DISPOSABLE) IMPLANT
GOWN PREVENTION PLUS XXLARGE (GOWN DISPOSABLE) ×2 IMPLANT
GOWN STRL NON-REIN LRG LVL3 (GOWN DISPOSABLE) IMPLANT
KIT BASIN OR (CUSTOM PROCEDURE TRAY) ×2 IMPLANT
LIGASURE IMPACT 36 18CM CVD LR (INSTRUMENTS) IMPLANT
NS IRRIG 1000ML POUR BTL (IV SOLUTION) ×4 IMPLANT
PENCIL BUTTON HOLSTER BLD 10FT (ELECTRODE) ×2 IMPLANT
RETRACTOR WND ALEXIS 18 MED (MISCELLANEOUS) IMPLANT
RTRCTR WOUND ALEXIS 18CM MED (MISCELLANEOUS)
SCALPEL HARMONIC ACE (MISCELLANEOUS) ×1 IMPLANT
SCISSORS LAP 5X35 DISP (ENDOMECHANICALS) IMPLANT
SEALER TISSUE G2 CVD JAW 35 (ENDOMECHANICALS) IMPLANT
SEALER TISSUE G2 CVD JAW 45CM (ENDOMECHANICALS)
SET IRRIG TUBING LAPAROSCOPIC (IRRIGATION / IRRIGATOR) IMPLANT
SHEET LAVH (DRAPES) IMPLANT
SOLUTION ANTI FOG 6CC (MISCELLANEOUS) ×2 IMPLANT
SPONGE GAUZE 4X4 12PLY (GAUZE/BANDAGES/DRESSINGS) ×2 IMPLANT
SPONGE LAP 18X18 X RAY DECT (DISPOSABLE) ×4 IMPLANT
STAPLER VISISTAT 35W (STAPLE) ×1 IMPLANT
SUCTION POOLE TIP (SUCTIONS) ×2 IMPLANT
SUT PDS AB 0 CTX 60 (SUTURE) IMPLANT
SUT PDS AB 1 CTX 36 (SUTURE) IMPLANT
SUT PROLENE 2 0 SH DA (SUTURE) IMPLANT
SUT VIC AB 2-0 CT1 27 (SUTURE)
SUT VIC AB 2-0 CT1 TAPERPNT 27 (SUTURE) IMPLANT
SUT VIC AB 2-0 CTX 36 (SUTURE) IMPLANT
SUT VIC AB 2-0 SH 18 (SUTURE) ×2 IMPLANT
SUT VIC AB 3-0 SH 18 (SUTURE) IMPLANT
SUT VICRYL 2 0 18  UND BR (SUTURE) ×1
SUT VICRYL 2 0 18 UND BR (SUTURE) ×1 IMPLANT
SUT VICRYL 3 0 BR 18  UND (SUTURE)
SUT VICRYL 3 0 BR 18 UND (SUTURE) IMPLANT
SYR BULB IRRIGATION 50ML (SYRINGE) ×2 IMPLANT
TOWEL OR 17X26 10 PK STRL BLUE (TOWEL DISPOSABLE) ×2 IMPLANT
TRAY FOLEY CATH 14FRSI W/METER (CATHETERS) ×2 IMPLANT
TRAY LAP CHOLE (CUSTOM PROCEDURE TRAY) ×2 IMPLANT
TROCAR BLADELESS OPT 5 75 (ENDOMECHANICALS) ×2 IMPLANT
TROCAR XCEL BLUNT TIP 100MML (ENDOMECHANICALS) IMPLANT
TROCAR XCEL NON-BLD 11X100MML (ENDOMECHANICALS) IMPLANT
TUBING FILTER THERMOFLATOR (ELECTROSURGICAL) ×2 IMPLANT
YANKAUER SUCT BULB TIP 10FT TU (MISCELLANEOUS) ×2 IMPLANT
YANKAUER SUCT BULB TIP NO VENT (SUCTIONS) ×2 IMPLANT

## 2012-08-11 NOTE — Progress Notes (Signed)
Dr Council Mechanic notified pt had about 1 cup of black coffee at 0830 . Not new orders

## 2012-08-11 NOTE — Transfer of Care (Signed)
Immediate Anesthesia Transfer of Care Note  Patient: Chase Smith.  Procedure(s) Performed: Procedure(s) with comments: Diagnostic Laparoscopy,  Diverticulectomy and laparoscopic appendectomy (N/A) - Diagnostic Laparoscopy, Small Bowel Resection vs Diverticulectomy  APPENDECTOMY LAPAROSCOPIC (N/A)  Patient Location: PACU  Anesthesia Type:General  Level of Consciousness: awake, alert , oriented, patient cooperative and responds to stimulation  Airway & Oxygen Therapy: Patient Spontanous Breathing and Patient connected to face mask oxygen  Post-op Assessment: Report given to PACU RN, Post -op Vital signs reviewed and stable and Patient moving all extremities X 4  Post vital signs: Reviewed and stable  Complications: No apparent anesthesia complications

## 2012-08-11 NOTE — Op Note (Signed)
Laparoscopic Meckel's diverticulectomy and laparoscopic appendectomy  Indications: The patient presented with a history of right-sided abdominal pain around 1 month ago. A CT revealed a normal appendix and a 1.5-2 cm small bowel mass with inflammation. Repeat CT demonstrated non-resolution.    Pre-operative Diagnosis: Meckel's diverticulitis  Post-operative Diagnosis: Same  Surgeon: Almond Lint   Assistants: none  Findings:  Mildly Inflamed meckel's diverticulum and normal long tortuous appendix  Anesthesia: General endotracheal anesthesia and Local anesthesia 1% plain lidocaine, 0.25.% bupivacaine, with epinephrine  ASA Class: 2  Procedure Details  The patient was seen again in the Holding Room. The risks, benefits, complications, treatment options, and expected outcomes were discussed with the patient and/or family. The possibilities of perforation of viscus, bleeding, recurrent infection, the need for additional procedures, failure to diagnose a condition, and creating a complication requiring transfusion or operation were discussed. There was concurrence with the proposed plan and informed consent was obtained. The site of surgery was properly noted. The patient was taken to Operating Room, identified as Chase Smith. and the procedure verified as removal of small bowel mass. A Time Out was held and the above information confirmed.  The patient was placed in the supine position and general anesthesia was induced, along with placement of orogastric tube, Venodyne boots, and a Foley catheter. The abdomen was prepped and draped in a sterile fashion. Local anesthetic was infiltrated in the infraumbilical region.  A 1.5 cm curvilinear transverse incision was made just below the umbilicus.  The Kelly clamp was used to spread the subcutaneous tissues.  The fascia was elevated with 2 Kocher clamps and incised with the #11 blade.  A Tresa Endo was used to confirm entrance into the peritoneal cavity.   A pursestring suture was placed around the fascial incision.  The Hasson trocar was inserted into the abdomen and held in place with the tails of the suture.  The pneumoperitoneum was then established to steady pressure of 15 mmHg.     Additional 5 mm cannulas then placed in the left lower quadrant of the abdomen and the suprapubic region under direct visualization.  A careful evaluation of the entire abdomen was carried out. The patient was placed in Trendelenburg and rotated to the left.  The small intestines were retracted in the cephalad and left lateral direction away from the pelvis and right lower quadrant. The small bowel was run from the terminal ileum proximally.  A Meckel's diverticulum was found around 2 feet from terminal ileum.  This had some mild inflammation.  The small amount of mesentery was divided with the Harmonic scalpel.  The diverticulum was divided with the Endo GIA and removed via the umbilicus with an Endocatch bag.    The appendix was long and tortuous.  The appendix was carefully dissected. The appendix was was skeletonized with the harmonic scalpel.   The appendix was divided at its base using an endo-GIA stapler. Minimal appendiceal stump was left in place. The appendix was removed from the abdomen with an Endocatch bag through the left subcostal port.  There was no evidence of bleeding, leakage, or complication after division of the appendix. Irrigation was also performed and irrigate suctioned from the abdomen as well.  The 5 mm trocars were removed.  The pneumoperitoneum was evacuated from the abdomen.    The trocar site skin wounds were closed with 4-0 Monocryl and dressed with Dermabond.  Instrument, sponge, and needle counts were correct at the conclusion of the case.   Estimated Blood  Loss:  Minimal           Specimens: Meckel's diverticulum and appendix         Complications:  None; patient tolerated the procedure well.         Disposition: PACU -  hemodynamically stable.         Condition: stable

## 2012-08-11 NOTE — Telephone Encounter (Signed)
Discussed CT scan results with pt.  Still plan surgery.

## 2012-08-11 NOTE — Anesthesia Postprocedure Evaluation (Signed)
  Anesthesia Post-op Note  Patient: Chase Smith.  Procedure(s) Performed: Procedure(s) (LRB): Diagnostic Laparoscopy,  Diverticulectomy and laparoscopic appendectomy (N/A) APPENDECTOMY LAPAROSCOPIC (N/A)  Patient Location: PACU  Anesthesia Type: General  Level of Consciousness: awake and alert   Airway and Oxygen Therapy: Patient Spontanous Breathing  Post-op Pain: mild  Post-op Assessment: Post-op Vital signs reviewed, Patient's Cardiovascular Status Stable, Respiratory Function Stable, Patent Airway and No signs of Nausea or vomiting  Last Vitals:  Filed Vitals:   08/11/12 1430  BP: 127/73  Pulse: 46  Temp:   Resp: 13    Post-op Vital Signs: stable   Complications: No apparent anesthesia complications

## 2012-08-11 NOTE — H&P (View-Only) (Signed)
Patient ID: Chase Corp., male   DOB: [birth year], 67 y.o.   MRN: [id number]  Chief Complaint  Patient presents with  . New Evaluation    eval mesentric mass    HPI Chase Smith. is a 67 y.o. male.   HPI Patient is a 67  Year-old male who presented January 11 with abdominal pain. This was quite sharp it was in the right lower corner. He was concerned that he had appendicitis. In the emergency department CT was negative for appendicitis, but it did show a mass in the right lower quadrant. He was started on antibiotics. He stated that within the first 24-hours, he felt much better.  He continues to have a small nagging right discomfort in the right lower quadrant especially when he is moving or stretching.  He says he feels like something is pulling in that area.  He denies fevers or chills. He denies diarrhea or constipation. He has not had any flushing. He does have occasional headaches but these are unchanged. His father had a family history significant for pancreatic cancer, and his mother had breast cancer.  Past Medical History  Diagnosis Date  . Mitral valve prolapse   . Allergic rhinitis   . Uvulitis 11/2000    acute - responded to augmentin  . Vertigo     episodic mild positional  . Seborrheic keratosis   . Actinic keratoses   . Epididymitis     recurrent  . Mitral valve prolapse 12/1993    no murmur  . BPH (benign prostatic hyperplasia)     mild  . Sigmoid diverticulosis 08/1999    noted on flexible sigmoidoscopy   . Folliculitis   . Heart murmur, systolic 05/2009    aortic and mitral  . Hypertension     in the past - resolved  . Abdominal pain   . Generalized headaches     slight, dull    Past Surgical History  Procedure Date  . Tonsillectomy   . Pilonidal cyst excision   . Reconstruction of nose     secondary to deviated septum    Family History  Problem Relation Age of Onset  . Cancer Mother     breast  . Cancer Father     pancreatic  . Cancer  Maternal Aunt     ? ovarian  . Cancer Maternal Uncle     ? pancreatic  . Cancer Paternal Uncle     ? lung    Social History History  Substance Use Topics  . Smoking status: Never Smoker   . Smokeless tobacco: Never Used  . Alcohol Use: Yes     Comment: one drink per day (wine or beer)    Allergies  Allergen Reactions  . Sulfa Antibiotics Other (See Comments)    Severe lethargy  . Ace Inhibitors Cough  . Antihistamines, Chlorpheniramine-Type Other (See Comments)    Trouble urinating. Confirm type of antihistamine with the patient.     Current Outpatient Prescriptions  Medication Sig Dispense Refill  . aspirin EC 81 MG tablet Take 81 mg by mouth daily.      Marland Kitchen ibuprofen (ADVIL,MOTRIN) 200 MG tablet Take 400 mg by mouth every 6 (six) hours as needed. For pain        Review of Systems Review of Systems  Gastrointestinal: Positive for abdominal pain.  Skin: Positive for wound.  Neurological: Positive for headaches.    Blood pressure 160/68, pulse 68, temperature 97.7 F (36.5 C), temperature  source Oral, resp. rate 16, height 6' (1.829 m), weight 202 lb 9.6 oz (91.899 kg).  Physical Exam Physical Exam  Constitutional: He is oriented to person, place, and time. He appears well-developed and well-nourished. No distress.  HENT:  Head: Normocephalic and atraumatic.  Right Ear: External ear normal.  Left Ear: External ear normal.  Mouth/Throat: No oropharyngeal exudate.  Eyes: Pupils are equal, round, and reactive to light. Right eye exhibits no discharge. Left eye exhibits no discharge. No scleral icterus.  Neck: Normal range of motion. Neck supple. No tracheal deviation present. No thyromegaly present.  Cardiovascular: Normal rate, regular rhythm, normal heart sounds and intact distal pulses.  Exam reveals no gallop and no friction rub.   No murmur heard. Pulmonary/Chest: Effort normal and breath sounds normal. No respiratory distress. He has no wheezes. He has no rales.  He exhibits no tenderness.  Abdominal: Soft. Bowel sounds are normal. He exhibits no distension and no mass. There is no tenderness. There is no rebound and no guarding.  Musculoskeletal: Normal range of motion. He exhibits no edema and no tenderness.  Lymphadenopathy:    He has no cervical adenopathy.  Neurological: He is alert and oriented to person, place, and time. Coordination normal.  Skin: Skin is warm and dry. No rash noted. He is not diaphoretic. No erythema. No pallor.  Psychiatric: He has a normal mood and affect. His behavior is normal. Judgment and thought content normal.    Data Reviewed CT There is a low density lesion in the left hepatic lobe with simple  fluid attenuation (image 10) with hepatic cyst. There is smaller  likely hepatic cyst within the right lobe (image 25) which cannot  be characterized. The gallbladder is collapsed without  inflammation. The pancreas, spleen, adrenal glands, and left  kidney are normal. There is a low density cortical lesion in the  right kidney with simple fluid attenuation consistent with benign  cyst.  The stomach and duodenum are normal. There is no evidence of small  bowel dilatation. Contrast enters the ascending colon. The  ileocecal valve is normal. The appendix is identified and  demonstrates no inflammation (image 45). Within the right lower  quadrant there is a small soft tissue nodule within the small bowel  mesentery measuring 17 x 23 mm (image 46). This has a central  calcification versus focus of enhancement or intraluminal contrast.  The lesion is not clearly contiguous with the bowel. No additional  mesenteric lesions are identified. The ascending colon is normal.  The sigmoid colon contains several diverticula. The rectum is  normal.  Abdominal aorta is normal caliber. No retroperitoneal periportal  lymphadenopathy.  No free fluid the pelvis. The prostate gland bladder normal. No  pelvic lymphadenopathy. Review of  bone windows demonstrates no  aggressive osseous lesions. There is significant degenerative  change of the lumbar spine with endplate spurring and sclerosis.  There is benign-appearing sclerotic lesion in the right acetabulum.  IMPRESSION:  1. No clear explanation for right lower quadrant pain. No  evidence of appendicitis.  2. There is a small nodule/mass within the right lower quadrant  small bowel mesentery. Differential would include neuroendocrine  tumor versus a diverticulum. Recommend correlation with a  appropriate tumor markers for neuroendocrine tumor consider non  emergent oncology / GI consultation.  3. Benign-appearing hepatic cyst.  4. Bosniak one cyst of the right kidney.   Assessment/Plan    Abdominal mass, RLQ (right lower quadrant) Diverticulitis versus carcinoid tumor.  This may represent  a Meckel's diverticulum. It did seem to improve significantly with the antibiotics which makes me favor diverticulitis of the small bowel as the diagnosis.  We will set him up for laparoscopic exploration and probable diverticulectomy. I advised the patient that if this is a small bowel tumor we would take out a segment of the small bowel and put it back together through a small incision.  And repeat a CT scan prior to surgery to make sure that this has not completely resolved. It may have represented epiploic appendicitis.  I reviewed the risk of surgery including bleeding, infection, damage to adjacent structures, and unexpected findings.   Discussed with daughter who is fam med/ED  [facility] 07/27/2012, 12:38 PM

## 2012-08-11 NOTE — Anesthesia Preprocedure Evaluation (Addendum)
Anesthesia Evaluation  Patient identified by MRN, date of birth, ID band Patient awake    Reviewed: Allergy & Precautions, H&P , NPO status , Patient's Chart, lab work & pertinent test results  Airway Mallampati: II TM Distance: >3 FB Neck ROM: full    Dental no notable dental hx. (+) Teeth Intact and Dental Advisory Given   Pulmonary neg pulmonary ROS,  breath sounds clear to auscultation  Pulmonary exam normal       Cardiovascular Exercise Tolerance: Good + Valvular Problems/Murmurs MVP Rhythm:regular Rate:Normal     Neuro/Psych negative neurological ROS  negative psych ROS   GI/Hepatic negative GI ROS, Neg liver ROS,   Endo/Other  negative endocrine ROS  Renal/GU negative Renal ROS  negative genitourinary   Musculoskeletal   Abdominal   Peds  Hematology negative hematology ROS (+)   Anesthesia Other Findings   Reproductive/Obstetrics negative OB ROS                          Anesthesia Physical Anesthesia Plan  ASA: II  Anesthesia Plan: General   Post-op Pain Management:    Induction: Intravenous  Airway Management Planned: Oral ETT  Additional Equipment:   Intra-op Plan:   Post-operative Plan: Extubation in OR  Informed Consent: I have reviewed the patients History and Physical, chart, labs and discussed the procedure including the risks, benefits and alternatives for the proposed anesthesia with the patient or authorized representative who has indicated his/her understanding and acceptance.   Dental Advisory Given  Plan Discussed with: CRNA and Surgeon  Anesthesia Plan Comments:         Anesthesia Quick Evaluation

## 2012-08-11 NOTE — Interval H&P Note (Signed)
History and Physical Interval Note:  08/11/2012 12:11 PM  Chase Smith.  has presented today for surgery, with the diagnosis of abdominal mass   The various methods of treatment have been discussed with the patient and family. After consideration of risks, benefits and other options for treatment, the patient has consented to  Procedure(s) with comments: Diagnostic Laparoscopy, Small Bowel Resection vs Diverticulectomy  (N/A) - Diagnostic Laparoscopy, Small Bowel Resection vs Diverticulectomy  as a surgical intervention .  Repeat CT same.  Mass still present.  The patient's history has been reviewed, patient examined, no change in status, stable for surgery.  I have reviewed the patient's chart and labs.  Questions were answered to the patient's satisfaction.     Chase Smith

## 2012-08-11 NOTE — Preoperative (Signed)
Beta Blockers   Reason not to administer Beta Blockers:Not Applicable 

## 2012-08-12 NOTE — Discharge Summary (Signed)
Physician Discharge Summary  Patient ID: Chase Smith. MRN: [id number] DOB/AGE: 1945/07/23 67 y.o.  Admit date: 08/11/2012 Discharge date: 08/12/2012  Admission Diagnoses: Small bowel mass  Discharge Diagnoses:  Meckel's diverticulum  Discharged Condition: stable  Hospital Course:  Pt admitted for observation following lap meckel's diverticulectomy and appendectomy.  He was able to void without foley and was able to advance his diet without nausea/vomiting.  He had adequate pain control with oral medication.  He was discharged to home in stable condition.    Consults: None  Treatments: surgery: see above  Discharge Exam: Blood pressure 141/77, pulse 55, temperature 97.6 F (36.4 C), temperature source Oral, resp. rate 17, height 6' (1.829 m), weight 203 lb (92.08 kg), SpO2 95.00%. General appearance: alert, cooperative and no distress Resp: breathing comfortably GI: soft, approp tender, non distended Extremities: extremities normal, atraumatic, no cyanosis or edema  Disposition: 01-Home or Self Care  Discharge Orders   Future Appointments Provider Department Dept Phone   08/27/2012 11:00 AM Almond Lint, MD [facility], [place] [phone]   Future Orders Complete By Expires     Call MD for:  difficulty breathing, headache or visual disturbances  As directed     Call MD for:  persistant nausea and vomiting  As directed     Call MD for:  redness, tenderness, or signs of infection (pain, swelling, redness, odor or green/yellow discharge around incision site)  As directed     Call MD for:  severe uncontrolled pain  As directed     Call MD for:  temperature >100.4  As directed     Diet - low sodium heart healthy  As directed     Increase activity slowly  As directed         Medication List    TAKE these medications       aspirin EC 81 MG tablet  Take 81 mg by mouth at bedtime.     fish oil-omega-3 fatty acids 1000 MG capsule  Take 1 g by mouth daily.     flunisolide 29 MCG/ACT (0.025%) nasal spray  Commonly known as:  NASAREL  Place 2 sprays into the nose 2 (two) times daily. Dose is for each nostril.     HYDROcodone-acetaminophen 5-325 MG per tablet  Commonly known as:  NORCO  Take 1-2 tablets by mouth every 4 (four) hours as needed for pain.     ibuprofen 200 MG tablet  Commonly known as:  ADVIL,MOTRIN  Take 400 mg by mouth every 6 (six) hours as needed. For pain     Vitamin D 2000 UNITS tablet  Take 2,000 Units by mouth daily.           Follow-up Information   Follow up with [facility], MD. Schedule an appointment as soon as possible for a visit in 2 weeks.   Contact information:   [address] [place] [address] [phone]       Signed: Almond Lint 08/12/2012, 11:29 AM

## 2012-08-12 NOTE — Progress Notes (Signed)
Patient discharged ambulatory. rx for norco faxed and called in to pharmacy. States understanding of discharge instructions. Refuses flu vaccine at this time, states will get at pharmacy later.

## 2012-08-13 ENCOUNTER — Encounter (HOSPITAL_COMMUNITY): Payer: Self-pay | Admitting: General Surgery

## 2012-08-14 ENCOUNTER — Other Ambulatory Visit: Payer: Self-pay

## 2012-08-27 ENCOUNTER — Encounter (INDEPENDENT_AMBULATORY_CARE_PROVIDER_SITE_OTHER): Payer: Self-pay | Admitting: General Surgery

## 2012-08-27 ENCOUNTER — Ambulatory Visit (INDEPENDENT_AMBULATORY_CARE_PROVIDER_SITE_OTHER): Payer: BC Managed Care – PPO | Admitting: General Surgery

## 2012-08-27 VITALS — BP 144/86 | HR 76 | Temp 98.3°F | Resp 18 | Ht 72.0 in | Wt 199.5 lb

## 2012-08-27 DIAGNOSIS — Q430 Meckel's diverticulum (displaced) (hypertrophic): Secondary | ICD-10-CM

## 2012-08-27 HISTORY — DX: Meckel's diverticulum (displaced) (hypertrophic): Q43.0

## 2012-08-27 NOTE — Assessment & Plan Note (Signed)
Patient has no evidence of surgical complications. He is advised to back to his normal activities other than extremely heavy lifting. I advised him he could slowly increase this over the next 1 to 2 weeks.  He is not working outside the home and does not need a work note.  Follow up PRN

## 2012-08-27 NOTE — Patient Instructions (Signed)
Follow up as needed.  Continue no heavy lifting for another 1-2 weeks.

## 2012-08-27 NOTE — Progress Notes (Signed)
HISTORY: Patient is now 2 weeks status post laparoscopic Meckel's diverticulectomy and incidental appendectomy.  He continues to have an occasional twinge of discomfort in the right lower quadrant. His umbilicus is still a little sore. He is no longer taking narcotics and has not needed Tylenol or ibuprofen either.  Denies fevers and chills. He is not having nausea or vomiting. He is eating okay and is having normal bowel movements.    EXAM: General:  Alert and oriented Incision:  Healing well.  No evidence of infection.   PATHOLOGY: 1. Appendix, Incidental - APPENDICEAL TISSUE, NO EVIDENCE OF ACTIVE INFLAMMATION OR NEOPLASM. 2. Diverticulum, Meckel's - FINDINGS CONSISTENT WITH MECKEL'S DIVERTICULUM WITH GASTRIC AND PANCREATIC HETEROTOPIA. NO EVIDENCE OF ACTIVE INFLAMMATION OR NEOPLASM.   ASSESSMENT AND PLAN:   Meckel's diverticulitis Patient has no evidence of surgical complications. He is advised to back to his normal activities other than extremely heavy lifting. I advised him he could slowly increase this over the next 1 to 2 weeks.  He is not working outside the home and does not need a work note.  Follow up PRN      Maudry Diego, MD Surgical Oncology, General & Endocrine Surgery [facility]  Pearla Dubonnet, MD Marden Noble, MD

## 2012-09-29 ENCOUNTER — Encounter (INDEPENDENT_AMBULATORY_CARE_PROVIDER_SITE_OTHER): Payer: Self-pay

## 2013-02-02 ENCOUNTER — Other Ambulatory Visit: Payer: Self-pay

## 2013-05-05 ENCOUNTER — Other Ambulatory Visit: Payer: Self-pay

## 2013-06-29 DIAGNOSIS — R05 Cough: Secondary | ICD-10-CM | POA: Diagnosis not present

## 2013-06-29 DIAGNOSIS — R059 Cough, unspecified: Secondary | ICD-10-CM | POA: Diagnosis not present

## 2013-07-13 ENCOUNTER — Other Ambulatory Visit: Payer: Self-pay | Admitting: General Surgery

## 2013-07-13 DIAGNOSIS — I059 Rheumatic mitral valve disease, unspecified: Secondary | ICD-10-CM

## 2013-07-14 ENCOUNTER — Ambulatory Visit (HOSPITAL_COMMUNITY): Payer: Medicare Other | Attending: Cardiology | Admitting: Radiology

## 2013-07-14 DIAGNOSIS — I059 Rheumatic mitral valve disease, unspecified: Secondary | ICD-10-CM

## 2013-07-14 NOTE — Progress Notes (Signed)
Echocardiogram performed.  

## 2013-07-25 ENCOUNTER — Encounter: Payer: Self-pay | Admitting: General Surgery

## 2013-07-25 DIAGNOSIS — I341 Nonrheumatic mitral (valve) prolapse: Secondary | ICD-10-CM

## 2013-07-27 ENCOUNTER — Encounter: Payer: Self-pay | Admitting: General Surgery

## 2013-07-27 ENCOUNTER — Encounter: Payer: Self-pay | Admitting: Cardiology

## 2013-07-27 ENCOUNTER — Ambulatory Visit (INDEPENDENT_AMBULATORY_CARE_PROVIDER_SITE_OTHER): Payer: Medicare Other | Admitting: Cardiology

## 2013-07-27 VITALS — BP 160/98 | HR 55 | Ht 72.0 in | Wt 210.0 lb

## 2013-07-27 DIAGNOSIS — I059 Rheumatic mitral valve disease, unspecified: Secondary | ICD-10-CM | POA: Diagnosis not present

## 2013-07-27 DIAGNOSIS — I519 Heart disease, unspecified: Secondary | ICD-10-CM | POA: Diagnosis not present

## 2013-07-27 DIAGNOSIS — I5189 Other ill-defined heart diseases: Secondary | ICD-10-CM

## 2013-07-27 DIAGNOSIS — I7121 Aneurysm of the ascending aorta, without rupture: Secondary | ICD-10-CM | POA: Insufficient documentation

## 2013-07-27 DIAGNOSIS — I77810 Thoracic aortic ectasia: Secondary | ICD-10-CM | POA: Diagnosis not present

## 2013-07-27 DIAGNOSIS — I340 Nonrheumatic mitral (valve) insufficiency: Secondary | ICD-10-CM

## 2013-07-27 DIAGNOSIS — I341 Nonrheumatic mitral (valve) prolapse: Secondary | ICD-10-CM

## 2013-07-27 DIAGNOSIS — I712 Thoracic aortic aneurysm, without rupture: Secondary | ICD-10-CM | POA: Insufficient documentation

## 2013-07-27 NOTE — Progress Notes (Signed)
[address] [place], [place]  [address] Phone: [phone] Fax:  [phone]  Date:  07/27/2013   ID:  Chase Riedesel., DOB 11-May-1946, MRN [id number]  PCP:  Henrine Screws, MD  Cardiologist:  Fransico Him, MD    History of Present Illness: Chase Smith. is a 68 y.o. male with a history of heart murmur withmitral valve prolapse, dilated aortic root and grade 2 diastolic dysfunction on echo who has yearly echo done and the last one done showed severe bileaflet MVP with moderate MR.  He had MVP diagnosed in 1995.  He does have DOE when doing activites and going up and down stairs.  He occasionally notices a twinge of pain in his shoulder but no chest pressure.  He denies any LE edema.  He has noticed on occasion some palpitations.  He occasionally has some dizziness after bending over but no syncope.   Wt Readings from Last 3 Encounters:  07/27/13 210 lb (95.255 kg)  08/27/12 199 lb 8 oz (90.493 kg)  08/11/12 203 lb (92.08 kg)     Past Medical History  Diagnosis Date  . Mitral valve prolapse   . Allergic rhinitis   . Uvulitis 11/2000    acute - responded to augmentin  . Vertigo     episodic mild positional  . Seborrheic keratosis   . Actinic keratoses   . Epididymitis     recurrent  . Mitral valve prolapse 12/1993    no murmur  . BPH (benign prostatic hyperplasia)     mild  . Sigmoid diverticulosis 08/1999    noted on flexible sigmoidoscopy   . Folliculitis   . Heart murmur, systolic 82/6415    aortic and mitral  . Hypertension     in the past - resolved  . Abdominal pain   . Generalized headaches     slight, dull  . ED (erectile dysfunction) 08/2011    cancelled f/u with Urology  . Dilated aortic root   . Left ventricular diastolic dysfunction, NYHA class 2     Current Outpatient Prescriptions  Medication Sig Dispense Refill  . aspirin EC 81 MG tablet Take 81 mg by mouth at bedtime.       . Cholecalciferol (VITAMIN D) 2000 UNITS tablet  Take 2,000 Units by mouth daily.      . fish oil-omega-3 fatty acids 1000 MG capsule Take 1 g by mouth daily.      . flunisolide (NASAREL) 29 MCG/ACT (0.025%) nasal spray Place 2 sprays into the nose 2 (two) times daily. Dose is for each nostril.      Marland Kitchen ibuprofen (ADVIL,MOTRIN) 200 MG tablet Take 400 mg by mouth every 6 (six) hours as needed. For pain       No current facility-administered medications for this visit.    Allergies:    Allergies  Allergen Reactions  . Sulfa Antibiotics Other (See Comments)    Severe lethargy  . Ace Inhibitors Cough  . Antihistamines, Chlorpheniramine-Type Other (See Comments)    Trouble urinating. Confirm type of antihistamine with the patient.     Social History:  The patient  reports that he has never smoked. He has never used smokeless tobacco. He reports that he drinks alcohol. He reports that he does not use illicit drugs.   Family History:  The patient's family history includes Cancer in his father, maternal aunt, maternal uncle, mother, and paternal uncle; Heart failure in his father.   ROS:  Please  see the history of present illness.      All other systems reviewed and negative.   PHYSICAL EXAM: VS:  BP 160/98  Pulse 55  Ht 6' (1.829 m)  Wt 210 lb (95.255 kg)  BMI 28.47 kg/m2 Well nourished, well developed, in no acute distress HEENT: normal Neck: no JVD Cardiac:  normal S1, S2; RRR; midsystolic click with late systolic murmur at apex Lungs:  clear to auscultation bilaterally, no wheezing, rhonchi or rales Abd: soft, nontender, no hepatomegaly Ext: no edema Skin: warm and dry Neuro:  CNs 2-12 intact, no focal abnormalities noted  EKG:  Sinus bradycardia at 55bpm with no ST changes  ASSESSMENT AND PLAN:  1. Severe bileaflet MVP with at least moderate MR.  He has some DOE.  I have recommended proceeding with TEE to ascertain degree of MR.  If severe then will need to proceed with cardiac cath and CVTS referral.  I have reviewed the  risks and benefits of TEE including risk of esophageal perforation, bleeding, aspiration, MI, CVA or death and sedation.  Patient understands and wished to proceed.  Followup with me in 1 year  Signed, Fransico Him, MD 07/27/2013 4:08 PM

## 2013-07-27 NOTE — Patient Instructions (Addendum)
Your physician recommends that you continue on your current medications as directed. Please refer to the Current Medication list given to you today.  Your physician has requested that you have a TEE. During a TEE, sound waves are used to create images of your heart. It provides your doctor with information about the size and shape of your heart and how well your heart's chambers and valves are working. In this test, a transducer is attached to the end of a flexible tube that's guided down your throat and into your esophagus (the tube leading from you mouth to your stomach) to get a more detailed image of your heart. You are not awake for the procedure. Please see the instruction sheet given to you today. For further information please visit [email or web address]. ( You are scheduled for 08/08/2013 at 9:30 Am)  Your physician wants you to follow-up in: 1 Year with Dr Mallie Snooks will receive a reminder letter in the mail two months in advance. If you don't receive a letter, please call our office to schedule the follow-up appointment.

## 2013-08-03 ENCOUNTER — Other Ambulatory Visit (INDEPENDENT_AMBULATORY_CARE_PROVIDER_SITE_OTHER): Payer: Medicare Other

## 2013-08-03 DIAGNOSIS — I059 Rheumatic mitral valve disease, unspecified: Secondary | ICD-10-CM | POA: Diagnosis not present

## 2013-08-03 DIAGNOSIS — I341 Nonrheumatic mitral (valve) prolapse: Secondary | ICD-10-CM

## 2013-08-03 LAB — CBC WITH DIFFERENTIAL/PLATELET
BASOS ABS: 0 10*3/uL (ref 0.0–0.1)
BASOS PCT: 0.5 % (ref 0.0–3.0)
Eosinophils Absolute: 0.2 10*3/uL (ref 0.0–0.7)
Eosinophils Relative: 3.1 % (ref 0.0–5.0)
HEMATOCRIT: 47.3 % (ref 39.0–52.0)
HEMOGLOBIN: 15.4 g/dL (ref 13.0–17.0)
LYMPHS ABS: 1.6 10*3/uL (ref 0.7–4.0)
LYMPHS PCT: 25.1 % (ref 12.0–46.0)
MCHC: 32.5 g/dL (ref 30.0–36.0)
MCV: 94.7 fl (ref 78.0–100.0)
MONOS PCT: 7.5 % (ref 3.0–12.0)
Monocytes Absolute: 0.5 10*3/uL (ref 0.1–1.0)
NEUTROS ABS: 4.1 10*3/uL (ref 1.4–7.7)
Neutrophils Relative %: 63.8 % (ref 43.0–77.0)
Platelets: 190 10*3/uL (ref 150.0–400.0)
RBC: 4.99 Mil/uL (ref 4.22–5.81)
RDW: 13.9 % (ref 11.5–14.6)
WBC: 6.4 10*3/uL (ref 4.5–10.5)

## 2013-08-03 LAB — PROTIME-INR
INR: 1.1 ratio — AB (ref 0.8–1.0)
Prothrombin Time: 11.2 s (ref 10.2–12.4)

## 2013-08-03 LAB — BASIC METABOLIC PANEL
BUN: 15 mg/dL (ref 6–23)
CALCIUM: 8.8 mg/dL (ref 8.4–10.5)
CHLORIDE: 105 meq/L (ref 96–112)
CO2: 26 meq/L (ref 19–32)
CREATININE: 1 mg/dL (ref 0.4–1.5)
GFR: 77.75 mL/min (ref >60.00)
GLUCOSE: 76 mg/dL (ref 70–99)
Potassium: 3.7 mEq/L (ref 3.5–5.1)
Sodium: 138 mEq/L (ref 135–145)

## 2013-08-05 DIAGNOSIS — Z79899 Other long term (current) drug therapy: Secondary | ICD-10-CM | POA: Diagnosis not present

## 2013-08-05 DIAGNOSIS — Z0000 Encounter for general adult medical examination without abnormal findings: Secondary | ICD-10-CM | POA: Diagnosis not present

## 2013-08-05 DIAGNOSIS — R030 Elevated blood-pressure reading, without diagnosis of hypertension: Secondary | ICD-10-CM | POA: Diagnosis not present

## 2013-08-05 DIAGNOSIS — Z136 Encounter for screening for cardiovascular disorders: Secondary | ICD-10-CM | POA: Diagnosis not present

## 2013-08-05 DIAGNOSIS — E559 Vitamin D deficiency, unspecified: Secondary | ICD-10-CM | POA: Diagnosis not present

## 2013-08-05 DIAGNOSIS — Z1329 Encounter for screening for other suspected endocrine disorder: Secondary | ICD-10-CM | POA: Diagnosis not present

## 2013-08-05 DIAGNOSIS — N529 Male erectile dysfunction, unspecified: Secondary | ICD-10-CM | POA: Diagnosis not present

## 2013-08-05 DIAGNOSIS — I059 Rheumatic mitral valve disease, unspecified: Secondary | ICD-10-CM | POA: Diagnosis not present

## 2013-08-08 ENCOUNTER — Encounter (HOSPITAL_COMMUNITY)
Admission: RE | Disposition: A | Discharge status: Home / Self Care | Payer: Self-pay | Source: Ambulatory Visit | Attending: Cardiology

## 2013-08-08 ENCOUNTER — Encounter (HOSPITAL_COMMUNITY): Payer: Self-pay

## 2013-08-08 ENCOUNTER — Ambulatory Visit (HOSPITAL_COMMUNITY)
Admission: RE | Admit: 2013-08-08 | Discharge: 2013-08-08 | Disposition: A | Discharge status: Home / Self Care | Payer: Medicare Other | Source: Ambulatory Visit | Attending: Cardiology | Admitting: Cardiology

## 2013-08-08 DIAGNOSIS — I519 Heart disease, unspecified: Secondary | ICD-10-CM | POA: Diagnosis not present

## 2013-08-08 DIAGNOSIS — I059 Rheumatic mitral valve disease, unspecified: Secondary | ICD-10-CM

## 2013-08-08 DIAGNOSIS — Z7982 Long term (current) use of aspirin: Secondary | ICD-10-CM | POA: Diagnosis not present

## 2013-08-08 DIAGNOSIS — R011 Cardiac murmur, unspecified: Secondary | ICD-10-CM | POA: Insufficient documentation

## 2013-08-08 DIAGNOSIS — I340 Nonrheumatic mitral (valve) insufficiency: Secondary | ICD-10-CM

## 2013-08-08 DIAGNOSIS — I341 Nonrheumatic mitral (valve) prolapse: Secondary | ICD-10-CM

## 2013-08-08 HISTORY — PX: TEE WITHOUT CARDIOVERSION: SHX5443

## 2013-08-08 SURGERY — ECHOCARDIOGRAM, TRANSESOPHAGEAL
Anesthesia: Moderate Sedation

## 2013-08-08 MED ORDER — SODIUM CHLORIDE 0.9 % IV SOLN
INTRAVENOUS | Status: DC
  Administered 2013-08-08: 08:00:00 via INTRAVENOUS

## 2013-08-08 MED ORDER — LIDOCAINE VISCOUS 2 % MT SOLN
OROMUCOSAL | Status: DC | PRN
  Administered 2013-08-08: 5 mL via OROMUCOSAL

## 2013-08-08 MED ORDER — FENTANYL CITRATE 0.05 MG/ML IJ SOLN
INTRAMUSCULAR | Status: DC | PRN
  Administered 2013-08-08 (×2): 25 ug via INTRAVENOUS

## 2013-08-08 MED ORDER — MIDAZOLAM HCL 5 MG/ML IJ SOLN
INTRAMUSCULAR | Status: AC
  Filled 2013-08-08: qty 1

## 2013-08-08 MED ORDER — FENTANYL CITRATE 0.05 MG/ML IJ SOLN
INTRAMUSCULAR | Status: AC
  Filled 2013-08-08: qty 2

## 2013-08-08 MED ORDER — LIDOCAINE VISCOUS 2 % MT SOLN
OROMUCOSAL | Status: AC
  Filled 2013-08-08: qty 15

## 2013-08-08 MED ORDER — MIDAZOLAM HCL 10 MG/2ML IJ SOLN
INTRAMUSCULAR | Status: DC | PRN
  Administered 2013-08-08 (×2): 2 mg via INTRAVENOUS

## 2013-08-08 MED ORDER — BUTAMBEN-TETRACAINE-BENZOCAINE 2-2-14 % EX AERO
INHALATION_SPRAY | CUTANEOUS | Status: DC | PRN
  Administered 2013-08-08: 1 via TOPICAL

## 2013-08-08 NOTE — CV Procedure (Signed)
    Procedure Note  Procedure:  Transesophageal echocardiogram Operator:  Fransico Him, MD Indications:  MVP with MR Complications:None IV Meds: Versed 4mg  IV and Fentanyl 22mcg IV  Results: Normal LV size and function Normal RV size and function Normal RA Normal TV with trivial TR Normal trileaflet AV with trivial AR Normall PV with trivial PR Mildly dilated LA with normal LAA and no thrombus Myxomatous posterior MV leaflet with moderate MR eccentric wall hugging jet which does not flow into the pulmonary vein by colorflow or pulse wave doppler There is evidence of rupture of a very small chordae tendinae off the MV leaflet. Lipomatous hypertrophy of the interatrial septum with no evidence of shunt by colorflow doppler Normal thoracic and ascending aorta  Patient tolerated the procedure well

## 2013-08-08 NOTE — Interval H&P Note (Signed)
History and Physical Interval Note:  08/08/2013 9:11 AM  [name]  has presented today for surgery, with the diagnosis of SEVERE BILEAFLET MITRAL PROLAPSE  The various methods of treatment have been discussed with the patient and family. After consideration of risks, benefits and other options for treatment, the patient has consented to  Procedure(s): TRANSESOPHAGEAL ECHOCARDIOGRAM (TEE) (N/A) as a surgical intervention .  The patient's history has been reviewed, patient examined, no change in status, stable for surgery.  I have reviewed the patient's chart and labs.  Questions were answered to the patient's satisfaction.     TURNER,TRACI R

## 2013-08-08 NOTE — Discharge Instructions (Addendum)
Moderate Sedation, Adult °Moderate sedation is given to help you relax or even sleep through a procedure. You may remain sleepy, be clumsy, or have poor balance for several hours following this procedure. Arrange for a responsible adult, family member, or friend to take you home. A responsible adult should stay with you for at least 24 hours or until the medicines have worn off. °· Do not participate in any activities where you could become injured for the next 24 hours, or until you feel normal again. Do not: °· Drive. °· Swim. °· Ride a bicycle. °· Operate heavy machinery. °· Cook. °· Use power tools. °· Climb ladders. °· Work at heights. °· Do not make important decisions or sign legal documents until you are improved. °· Vomiting may occur if you eat too soon. When you can drink without vomiting, try water, juice, or soup. Try solid foods if you feel little or no nausea. °· Only take over-the-counter or prescription medications for pain, discomfort, or fever as directed by your caregiver.If pain medications have been prescribed for you, ask your caregiver how soon it is safe to take them. °· Make sure you and your family fully understands everything about the medication given to you. Make sure you understand what side effects may occur. °· You should not drink alcohol, take sleeping pills, or medications that cause drowsiness for at least 24 hours. °· If you smoke, do not smoke alone. °· If you are feeling better, you may resume normal activities 24 hours after receiving sedation. °· Keep all appointments as scheduled. Follow all instructions. °· Ask questions if you do not understand. °SEEK MEDICAL CARE IF:  °· Your skin is pale or bluish in color. °· You continue to feel sick to your stomach (nauseous) or throw up (vomit). °· Your pain is getting worse and not helped by medication. °· You have bleeding or swelling. °· You are still sleepy or feeling clumsy after 24 hours. °SEEK IMMEDIATE MEDICAL CARE IF:   °· You develop a rash. °· You have difficulty breathing. °· You develop any type of allergic problem. °· You have a fever. °Document Released: 03/11/2001 Document Revised: 09/08/2011 Document Reviewed: 02/21/2013 °[employer]® Patient Information ©2014 [employer]. ° °

## 2013-08-08 NOTE — Progress Notes (Signed)
  Echocardiogram Echocardiogram Transesophageal has been performed.  Philipp Deputy 08/08/2013, 9:56 AM

## 2013-08-08 NOTE — H&P (Signed)
[address] [place]  [place], [place] [address]  Phone: [phone]  Fax: [phone]   Date: 08/08/2013   ID: Chase Smith., DOB 1946/01/01, MRN [id number]  PCP: Henrine Screws, MD  Cardiologist: Fransico Him, MD   History of Present Illness:  Chase Smith. is a 68 y.o. male with a history of heart murmur withmitral valve prolapse, dilated aortic root and grade 2 diastolic dysfunction on echo who has yearly echo done and the last one done showed severe bileaflet MVP with moderate MR. He had MVP diagnosed in 1995. He does have DOE when doing activites and going up and down stairs. He occasionally notices a twinge of pain in his shoulder but no chest pressure. He denies any LE edema. He has noticed on occasion some palpitations. He occasionally has some dizziness after bending over but no syncope.  Wt Readings from Last 3 Encounters:   07/27/13  210 lb (95.255 kg)   08/27/12  199 lb 8 oz (90.493 kg)   08/11/12  203 lb (92.08 kg)    Past Medical History   Diagnosis  Date   .  Mitral valve prolapse    .  Allergic rhinitis    .  Uvulitis  11/2000     acute - responded to augmentin   .  Vertigo      episodic mild positional   .  Seborrheic keratosis    .  Actinic keratoses    .  Epididymitis      recurrent   .  Mitral valve prolapse  12/1993     no murmur   .  BPH (benign prostatic hyperplasia)      mild   .  Sigmoid diverticulosis  08/1999     noted on flexible sigmoidoscopy   .  Folliculitis    .  Heart murmur, systolic  99/2426     aortic and mitral   .  Hypertension      in the past - resolved   .  Abdominal pain    .  Generalized headaches      slight, dull   .  ED (erectile dysfunction)  08/2011     cancelled f/u with Urology   .  Dilated aortic root    .  Left ventricular diastolic dysfunction, NYHA class 2     Current Outpatient Prescriptions   Medication  Sig  Dispense  Refill   .  aspirin EC 81 MG tablet  Take 81 mg by mouth at bedtime.     .   Cholecalciferol (VITAMIN D) 2000 UNITS tablet  Take 2,000 Units by mouth daily.     .  fish oil-omega-3 fatty acids 1000 MG capsule  Take 1 g by mouth daily.     .  flunisolide (NASAREL) 29 MCG/ACT (0.025%) nasal spray  Place 2 sprays into the nose 2 (two) times daily. Dose is for each nostril.     Marland Kitchen  ibuprofen (ADVIL,MOTRIN) 200 MG tablet  Take 400 mg by mouth every 6 (six) hours as needed. For pain      No current facility-administered medications for this visit.   Allergies:  Allergies   Allergen  Reactions   .  Sulfa Antibiotics  Other (See Comments)     Severe lethargy   .  Ace Inhibitors  Cough   .  Antihistamines, Chlorpheniramine-Type  Other (See Comments)     Trouble urinating. Confirm type of antihistamine with the patient.  Social History: The patient reports that he has never smoked. He has never used smokeless tobacco. He reports that he drinks alcohol. He reports that he does not use illicit drugs.  Family History: The patient's family history includes Cancer in his father, maternal aunt, maternal uncle, mother, and paternal uncle; Heart failure in his father.  ROS: Please see the history of present illness. All other systems reviewed and negative.  PHYSICAL EXAM:  VS: BP 160/98  Pulse 55  Ht 6' (1.829 m)  Wt 210 lb (95.255 kg)  BMI 28.47 kg/m2  Well nourished, well developed, in no acute distress  HEENT: normal  Neck: no JVD  Cardiac: normal S1, S2; RRR; midsystolic click with late systolic murmur at apex  Lungs: clear to auscultation bilaterally, no wheezing, rhonchi or rales  Abd: soft, nontender, no hepatomegaly  Ext: no edema  Skin: warm and dry  Neuro: CNs 2-12 intact, no focal abnormalities noted  EKG: Sinus bradycardia at 55bpm with no ST changes  ASSESSMENT AND PLAN:  1. Severe bileaflet MVP with at least moderate MR. He has some DOE. I have recommended proceeding with TEE to ascertain degree of MR. If severe then will need to proceed with cardiac cath and  CVTS referral. I have reviewed the risks and benefits of TEE including risk of esophageal perforation, bleeding, aspiration, MI, CVA or death and sedation. Patient understands and wished to proceed.  Signed,  Fransico Him, MD

## 2013-08-09 ENCOUNTER — Encounter (HOSPITAL_COMMUNITY): Payer: Self-pay | Admitting: Cardiology

## 2013-11-02 DIAGNOSIS — D235 Other benign neoplasm of skin of trunk: Secondary | ICD-10-CM | POA: Diagnosis not present

## 2013-11-02 DIAGNOSIS — L570 Actinic keratosis: Secondary | ICD-10-CM | POA: Diagnosis not present

## 2013-11-02 DIAGNOSIS — L819 Disorder of pigmentation, unspecified: Secondary | ICD-10-CM | POA: Diagnosis not present

## 2013-11-02 DIAGNOSIS — D485 Neoplasm of uncertain behavior of skin: Secondary | ICD-10-CM | POA: Diagnosis not present

## 2013-11-02 DIAGNOSIS — L821 Other seborrheic keratosis: Secondary | ICD-10-CM | POA: Diagnosis not present

## 2014-07-27 ENCOUNTER — Telehealth: Payer: Self-pay | Admitting: Cardiology

## 2014-07-27 NOTE — Telephone Encounter (Signed)
Left message on private VM to call and make an Foley appointment, but no ECHO is ordered.

## 2014-07-27 NOTE — Telephone Encounter (Signed)
New message     Patient is concerned about his yearly Echo that is suppose to be scheduled. He would like to know if  He is suppose to have one  (no orders in for it to be scheduled)   Please give patient a call  Thanks

## 2014-08-02 DIAGNOSIS — C44329 Squamous cell carcinoma of skin of other parts of face: Secondary | ICD-10-CM | POA: Diagnosis not present

## 2014-08-02 DIAGNOSIS — C44729 Squamous cell carcinoma of skin of left lower limb, including hip: Secondary | ICD-10-CM | POA: Diagnosis not present

## 2014-08-02 DIAGNOSIS — L570 Actinic keratosis: Secondary | ICD-10-CM | POA: Diagnosis not present

## 2014-08-02 DIAGNOSIS — D0439 Carcinoma in situ of skin of other parts of face: Secondary | ICD-10-CM | POA: Diagnosis not present

## 2014-08-07 DIAGNOSIS — Z23 Encounter for immunization: Secondary | ICD-10-CM | POA: Diagnosis not present

## 2014-08-07 DIAGNOSIS — J309 Allergic rhinitis, unspecified: Secondary | ICD-10-CM | POA: Diagnosis not present

## 2014-08-07 DIAGNOSIS — I349 Nonrheumatic mitral valve disorder, unspecified: Secondary | ICD-10-CM | POA: Diagnosis not present

## 2014-08-07 DIAGNOSIS — E559 Vitamin D deficiency, unspecified: Secondary | ICD-10-CM | POA: Diagnosis not present

## 2014-08-07 DIAGNOSIS — E538 Deficiency of other specified B group vitamins: Secondary | ICD-10-CM | POA: Diagnosis not present

## 2014-08-07 DIAGNOSIS — Z0001 Encounter for general adult medical examination with abnormal findings: Secondary | ICD-10-CM | POA: Diagnosis not present

## 2014-08-07 DIAGNOSIS — R3915 Urgency of urination: Secondary | ICD-10-CM | POA: Diagnosis not present

## 2014-08-14 ENCOUNTER — Encounter: Payer: Self-pay | Admitting: Cardiology

## 2014-08-14 DIAGNOSIS — I341 Nonrheumatic mitral (valve) prolapse: Secondary | ICD-10-CM | POA: Insufficient documentation

## 2014-08-14 NOTE — Progress Notes (Signed)
Cardiology Office Note   Date:  08/15/2014   ID:  Chase Heffner., DOB February 16, 1946, MRN [id number]  PCP:  Henrine Screws, MD  Cardiologist:   Sueanne Margarita, MD   Chief Complaint  Patient presents with  . Mitral Valve Prolapse  . Hypertension      History of Present Illness: Chase Betzold. is a 69 y.o. male with a history of heart murmur with bileaflet mitral valve prolapse with moderate MR, dilated aortic root and grade 2 diastolic dysfunction. He is doing well.  He denies any chest pain or pressure, SOB, DOE, LE edema, dizziness or syncope.  Occassionally he will notice a skipped heart beat.     Past Medical History  Diagnosis Date  . Allergic rhinitis   . Uvulitis 11/2000    acute - responded to augmentin  . Vertigo     episodic mild positional  . Seborrheic keratosis   . Actinic keratoses   . Epididymitis     recurrent  . BPH (benign prostatic hyperplasia)     mild  . Sigmoid diverticulosis 08/1999    noted on flexible sigmoidoscopy   . Folliculitis   . Hypertension     in the past - resolved  . Abdominal pain   . Generalized headaches     slight, dull  . ED (erectile dysfunction) 08/2011    cancelled f/u with Urology  . Dilated aortic root   . Left ventricular diastolic dysfunction, NYHA class 2   . MVP (mitral valve prolapse)     bileaflet with moderate eccentric MR    Past Surgical History  Procedure Laterality Date  . Tonsillectomy    . Pilonidal cyst excision    . Reconstruction of nose      secondary to deviated septum  . Laparoscopic small bowel resection N/A 08/11/2012    Procedure: Diagnostic Laparoscopy,  Diverticulectomy and laparoscopic appendectomy;  Surgeon: Stark Klein, MD;  Location: [facility];  Service: General;  Laterality: N/A;  Diagnostic Laparoscopy, Small Bowel Resection vs Diverticulectomy   . Laparoscopic appendectomy N/A 08/11/2012    Procedure: APPENDECTOMY LAPAROSCOPIC;  Surgeon: Stark Klein, MD;  Location: [facility];   Service: General;  Laterality: N/A;  . Tee without cardioversion N/A 08/08/2013    Procedure: TRANSESOPHAGEAL ECHOCARDIOGRAM (TEE);  Surgeon: Sueanne Margarita, MD;  Location: [facility] ENDOSCOPY;  Service: Cardiovascular;  Laterality: N/A;     Current Outpatient Prescriptions  Medication Sig Dispense Refill  . aspirin EC 81 MG tablet Take 81 mg by mouth at bedtime.     . Cholecalciferol (VITAMIN D) 2000 UNITS tablet Take 2,000 Units by mouth daily.    . Cyanocobalamin (VITAMIN B12 PO) Take 500 mg by mouth daily.    . fish oil-omega-3 fatty acids 1000 MG capsule Take 1 g by mouth daily.    . flunisolide (NASAREL) 29 MCG/ACT (0.025%) nasal spray Place 2 sprays into the nose 2 (two) times daily. Dose is for each nostril.    Marland Kitchen ibuprofen (ADVIL,MOTRIN) 200 MG tablet Take 400 mg by mouth every 6 (six) hours as needed. For pain     No current facility-administered medications for this visit.    Allergies:   Sulfa antibiotics; Ace inhibitors; and Antihistamines, chlorpheniramine-type    Social History:  The patient  reports that he has never smoked. He has never used smokeless tobacco. He reports that he drinks alcohol. He reports that he does not use illicit drugs.   Family History:  The patient's  family history includes Cancer in his father, maternal aunt, maternal uncle, mother, and paternal uncle; Heart failure in his father.    ROS:  Please see the history of present illness.   Otherwise, review of systems are positive for none.   All other systems are reviewed and negative.    PHYSICAL EXAM: VS:  BP 138/86 mmHg  Pulse 58  Ht 6' (1.829 m)  Wt 204 lb (92.534 kg)  BMI 27.66 kg/m2 , BMI Body mass index is 27.66 kg/(m^2). GEN: Well nourished, well developed, in no acute distress HEENT: normal Neck: no JVD, carotid bruits, or masses Cardiac: RRR; no rubs, or gallops,no edema.  2/6 late systolic murmur at Apex Respiratory:  clear to auscultation bilaterally, normal work of breathing GI: soft,  nontender, nondistended, + BS MS: no deformity or atrophy Skin: warm and dry, no rash Neuro:  Strength and sensation are intact Psych: euthymic mood, full affect   EKG:  EKG was ordered today and showed sinus bradycardia at 58bpm with no ST changes    Recent Labs: No results found for requested labs within last 365 days.    Lipid Panel No results found for: CHOL, TRIG, HDL, CHOLHDL, VLDL, LDLCALC, LDLDIRECT    Wt Readings from Last 3 Encounters:  08/15/14 204 lb (92.534 kg)  07/27/13 210 lb (95.255 kg)  08/27/12 199 lb 8 oz (90.493 kg)        ASSESSMENT AND PLAN:  1.  Severe bileaflet MVP with moderate eccentric MR by TEE a year ago.  Will repeat 2D echo to assess for progression 2.  Dilated aortic root followed by yearly echo 3.  HTN - controlled 4.  Diastolic dysfunction   Current medicines are reviewed at length with the patient today.  The patient does not have concerns regarding medicines.  The following changes have been made:  no change  Labs/ tests ordered today include: 2D echo     Disposition:   FU with me in 1 year   Signed, Sueanne Margarita, MD  08/15/2014 10:41 AM    [address], [place], [place]  [address] Phone: [phone]; Fax: [phone]

## 2014-08-15 ENCOUNTER — Ambulatory Visit (INDEPENDENT_AMBULATORY_CARE_PROVIDER_SITE_OTHER): Payer: Medicare Other | Admitting: Cardiology

## 2014-08-15 ENCOUNTER — Encounter: Payer: Self-pay | Admitting: Cardiology

## 2014-08-15 VITALS — BP 138/86 | HR 58 | Ht 72.0 in | Wt 204.0 lb

## 2014-08-15 DIAGNOSIS — H11002 Unspecified pterygium of left eye: Secondary | ICD-10-CM | POA: Diagnosis not present

## 2014-08-15 DIAGNOSIS — I341 Nonrheumatic mitral (valve) prolapse: Secondary | ICD-10-CM

## 2014-08-15 DIAGNOSIS — I519 Heart disease, unspecified: Secondary | ICD-10-CM

## 2014-08-15 DIAGNOSIS — I77810 Thoracic aortic ectasia: Secondary | ICD-10-CM

## 2014-08-15 DIAGNOSIS — I340 Nonrheumatic mitral (valve) insufficiency: Secondary | ICD-10-CM | POA: Diagnosis not present

## 2014-08-15 DIAGNOSIS — I5189 Other ill-defined heart diseases: Secondary | ICD-10-CM

## 2014-08-15 NOTE — Patient Instructions (Signed)
Your physician recommends that you continue on your current medications as directed. Please refer to the Current Medication list given to you today.  Your physician has requested that you have an echocardiogram. Echocardiography is a painless test that uses sound waves to create images of your heart. It provides your doctor with information about the size and shape of your heart and how well your heart's chambers and valves are working. This procedure takes approximately one hour. There are no restrictions for this procedure.  Your physician wants you to follow-up in: 6 months with Dr Turner You will receive a reminder letter in the mail two months in advance. If you don't receive a letter, please call our office to schedule the follow-up appointment.  

## 2014-08-18 ENCOUNTER — Other Ambulatory Visit (HOSPITAL_COMMUNITY): Payer: Medicare Other

## 2014-09-04 ENCOUNTER — Ambulatory Visit (HOSPITAL_COMMUNITY): Payer: Medicare Other | Attending: Internal Medicine

## 2014-09-04 DIAGNOSIS — I340 Nonrheumatic mitral (valve) insufficiency: Secondary | ICD-10-CM

## 2014-09-04 DIAGNOSIS — I341 Nonrheumatic mitral (valve) prolapse: Secondary | ICD-10-CM | POA: Insufficient documentation

## 2014-09-04 DIAGNOSIS — I77810 Thoracic aortic ectasia: Secondary | ICD-10-CM | POA: Diagnosis not present

## 2014-09-04 NOTE — Progress Notes (Signed)
2D Echo completed. 09/04/2014

## 2014-09-05 ENCOUNTER — Telehealth: Payer: Self-pay

## 2014-09-05 DIAGNOSIS — I341 Nonrheumatic mitral (valve) prolapse: Secondary | ICD-10-CM

## 2014-09-05 DIAGNOSIS — I340 Nonrheumatic mitral (valve) insufficiency: Secondary | ICD-10-CM

## 2014-09-05 DIAGNOSIS — I77810 Thoracic aortic ectasia: Secondary | ICD-10-CM

## 2014-09-05 NOTE — Telephone Encounter (Signed)
-----   Message from Sueanne Margarita, MD sent at 09/05/2014 10:10 AM EST ----- Please let patient know that echo showed normal LVF with mildly thickened heart muscle, midlly dilated aortic root, moderately thickened MV leaflets with prolapse and moderate MR, moderately enlarged LA - repeat echo in 1 year

## 2014-09-05 NOTE — Telephone Encounter (Signed)
Patient informed of results and verbal understanding expressed.  Repeat ECHO ordered to be scheduled in 1 year. Patient agrees with treatment plan.

## 2014-10-11 DIAGNOSIS — R030 Elevated blood-pressure reading, without diagnosis of hypertension: Secondary | ICD-10-CM | POA: Diagnosis not present

## 2014-10-11 DIAGNOSIS — Z136 Encounter for screening for cardiovascular disorders: Secondary | ICD-10-CM | POA: Diagnosis not present

## 2014-10-31 DIAGNOSIS — Z85828 Personal history of other malignant neoplasm of skin: Secondary | ICD-10-CM | POA: Diagnosis not present

## 2014-10-31 DIAGNOSIS — Z08 Encounter for follow-up examination after completed treatment for malignant neoplasm: Secondary | ICD-10-CM | POA: Diagnosis not present

## 2014-10-31 DIAGNOSIS — L821 Other seborrheic keratosis: Secondary | ICD-10-CM | POA: Diagnosis not present

## 2014-10-31 DIAGNOSIS — D225 Melanocytic nevi of trunk: Secondary | ICD-10-CM | POA: Diagnosis not present

## 2014-11-17 ENCOUNTER — Encounter: Payer: Self-pay | Admitting: Cardiology

## 2014-12-25 ENCOUNTER — Other Ambulatory Visit: Payer: Self-pay

## 2015-02-07 ENCOUNTER — Ambulatory Visit (INDEPENDENT_AMBULATORY_CARE_PROVIDER_SITE_OTHER): Payer: Medicare Other | Admitting: Cardiology

## 2015-02-07 ENCOUNTER — Encounter: Payer: Self-pay | Admitting: Cardiology

## 2015-02-07 VITALS — BP 150/80 | HR 58 | Ht 72.0 in | Wt 202.4 lb

## 2015-02-07 DIAGNOSIS — I10 Essential (primary) hypertension: Secondary | ICD-10-CM | POA: Diagnosis not present

## 2015-02-07 DIAGNOSIS — I77810 Thoracic aortic ectasia: Secondary | ICD-10-CM | POA: Diagnosis not present

## 2015-02-07 DIAGNOSIS — I340 Nonrheumatic mitral (valve) insufficiency: Secondary | ICD-10-CM

## 2015-02-07 DIAGNOSIS — I341 Nonrheumatic mitral (valve) prolapse: Secondary | ICD-10-CM | POA: Diagnosis not present

## 2015-02-07 HISTORY — DX: Essential (primary) hypertension: I10

## 2015-02-07 NOTE — Progress Notes (Signed)
Cardiology Office Note   Date:  02/07/2015   ID:  Chase Cannata., DOB 12/04/1945, MRN [id number]  PCP:  Henrine Screws, MD    Chief Complaint  Patient presents with  . Follow-up    mitral valve prolapse      History of Present Illness: Chase Rabbani. is a 69 y.o. male with a history of heart murmur with bileaflet mitral valve prolapse with moderate MR, dilated aortic root and grade 2 diastolic dysfunction. He is doing well. He denies any chest pain or pressure, SOB, DOE, LE edema, dizziness or syncope. Occassionally he will notice a skipped heart beat.     Past Medical History  Diagnosis Date  . Allergic rhinitis   . Uvulitis 11/2000    acute - responded to augmentin  . Vertigo     episodic mild positional  . Seborrheic keratosis   . Actinic keratoses   . Epididymitis     recurrent  . BPH (benign prostatic hyperplasia)     mild  . Sigmoid diverticulosis 08/1999    noted on flexible sigmoidoscopy   . Folliculitis   . Hypertension     in the past - resolved  . Abdominal pain   . Generalized headaches     slight, dull  . ED (erectile dysfunction) 08/2011    cancelled f/u with Urology  . Dilated aortic root   . Left ventricular diastolic dysfunction, NYHA class 2   . MVP (mitral valve prolapse)     bileaflet with moderate eccentric MR    Past Surgical History  Procedure Laterality Date  . Tonsillectomy    . Pilonidal cyst excision    . Reconstruction of nose      secondary to deviated septum  . Laparoscopic small bowel resection N/A 08/11/2012    Procedure: Diagnostic Laparoscopy,  Diverticulectomy and laparoscopic appendectomy;  Surgeon: Stark Klein, MD;  Location: [facility];  Service: General;  Laterality: N/A;  Diagnostic Laparoscopy, Small Bowel Resection vs Diverticulectomy   . Laparoscopic appendectomy N/A 08/11/2012    Procedure: APPENDECTOMY LAPAROSCOPIC;  Surgeon: Stark Klein, MD;  Location: [facility];  Service: General;   Laterality: N/A;  . Tee without cardioversion N/A 08/08/2013    Procedure: TRANSESOPHAGEAL ECHOCARDIOGRAM (TEE);  Surgeon: Sueanne Margarita, MD;  Location: [facility] ENDOSCOPY;  Service: Cardiovascular;  Laterality: N/A;     Current Outpatient Prescriptions  Medication Sig Dispense Refill  . aspirin EC 81 MG tablet Take 81 mg by mouth at bedtime.     . Cholecalciferol (VITAMIN D) 2000 UNITS tablet Take 2,000 Units by mouth daily.    . Cyanocobalamin (VITAMIN B12 PO) Take 500 mg by mouth daily.    . fish oil-omega-3 fatty acids 1000 MG capsule Take 1 g by mouth daily.    . flunisolide (NASAREL) 29 MCG/ACT (0.025%) nasal spray Place 2 sprays into the nose 2 (two) times daily. Dose is for each nostril.    Marland Kitchen ibuprofen (ADVIL,MOTRIN) 200 MG tablet Take 400 mg by mouth every 6 (six) hours as needed. For pain     No current facility-administered medications for this visit.    Allergies:   Sulfa antibiotics; Ace inhibitors; and Antihistamines, chlorpheniramine-type    Social History:  The patient  reports that he has never smoked. He has never used smokeless tobacco. He reports that he drinks alcohol. He reports that he does not use illicit drugs.  Family History:  The patient's family history includes Cancer in his father, maternal aunt, maternal uncle, mother, and paternal uncle; Heart failure in his father.    ROS:  Please see the history of present illness.   Otherwise, review of systems are positive for none.   All other systems are reviewed and negative.    PHYSICAL EXAM: VS:  BP 150/80 mmHg  Pulse 58  Ht 6' (1.829 m)  Wt 202 lb 6.4 oz (91.808 kg)  BMI 27.44 kg/m2  SpO2 97% , BMI Body mass index is 27.44 kg/(m^2). GEN: Well nourished, well developed, in no acute distress HEENT: normal Neck: no JVD, carotid bruits, or masses Cardiac: RRR; no rubs, or gallops,no edema.  Midsystolic click with midsystolic murmur at apex Respiratory:  clear to auscultation bilaterally, normal work of  breathing GI: soft, nontender, nondistended, + BS MS: no deformity or atrophy Skin: warm and dry, no rash Neuro:  Strength and sensation are intact Psych: euthymic mood, full affect   EKG:  EKG is not ordered today.    Recent Labs: No results found for requested labs within last 365 days.    Lipid Panel No results found for: CHOL, TRIG, HDL, CHOLHDL, VLDL, LDLCALC, LDLDIRECT    Wt Readings from Last 3 Encounters:  02/07/15 202 lb 6.4 oz (91.808 kg)  08/15/14 204 lb (92.534 kg)  07/27/13 210 lb (95.255 kg)    ASSESSMENT AND PLAN:  1. Severe bileaflet MVP with moderate eccentric MR by TEE a year ago. Will repeat 2D echo to assess for progression 08/2015 2. Dilated aortic root followed by yearly echo 3. HTN - controlled 4. Diastolic dysfunction     Current medicines are reviewed at length with the patient today.  The patient does not have concerns regarding medicines.  The following changes have been made:  no change  Labs/ tests ordered today: See above Assessment and Plan No orders of the defined types were placed in this encounter.     Disposition:   FU with me in 6 month  Signed, Sueanne Margarita, MD  02/07/2015 10:25 AM    [facility] [address], [place], [place]  [address] Phone: [phone]; Fax: [phone]

## 2015-02-07 NOTE — Patient Instructions (Signed)

## 2015-04-17 DIAGNOSIS — L03113 Cellulitis of right upper limb: Secondary | ICD-10-CM | POA: Diagnosis not present

## 2015-04-17 DIAGNOSIS — I10 Essential (primary) hypertension: Secondary | ICD-10-CM | POA: Diagnosis not present

## 2015-04-25 ENCOUNTER — Other Ambulatory Visit: Payer: Self-pay

## 2015-04-25 DIAGNOSIS — L570 Actinic keratosis: Secondary | ICD-10-CM | POA: Diagnosis not present

## 2015-04-25 DIAGNOSIS — C44622 Squamous cell carcinoma of skin of right upper limb, including shoulder: Secondary | ICD-10-CM | POA: Diagnosis not present

## 2015-05-31 ENCOUNTER — Encounter: Payer: Self-pay | Admitting: Cardiology

## 2015-06-05 DIAGNOSIS — Z85828 Personal history of other malignant neoplasm of skin: Secondary | ICD-10-CM | POA: Diagnosis not present

## 2015-06-05 DIAGNOSIS — L570 Actinic keratosis: Secondary | ICD-10-CM | POA: Diagnosis not present

## 2015-08-17 DIAGNOSIS — I349 Nonrheumatic mitral valve disorder, unspecified: Secondary | ICD-10-CM | POA: Diagnosis not present

## 2015-08-17 DIAGNOSIS — Z23 Encounter for immunization: Secondary | ICD-10-CM | POA: Diagnosis not present

## 2015-08-17 DIAGNOSIS — Z125 Encounter for screening for malignant neoplasm of prostate: Secondary | ICD-10-CM | POA: Diagnosis not present

## 2015-08-17 DIAGNOSIS — E538 Deficiency of other specified B group vitamins: Secondary | ICD-10-CM | POA: Diagnosis not present

## 2015-08-17 DIAGNOSIS — J309 Allergic rhinitis, unspecified: Secondary | ICD-10-CM | POA: Diagnosis not present

## 2015-08-17 DIAGNOSIS — E559 Vitamin D deficiency, unspecified: Secondary | ICD-10-CM | POA: Diagnosis not present

## 2015-08-17 DIAGNOSIS — Z0001 Encounter for general adult medical examination with abnormal findings: Secondary | ICD-10-CM | POA: Diagnosis not present

## 2015-08-17 DIAGNOSIS — I10 Essential (primary) hypertension: Secondary | ICD-10-CM | POA: Diagnosis not present

## 2015-08-17 DIAGNOSIS — Z1389 Encounter for screening for other disorder: Secondary | ICD-10-CM | POA: Diagnosis not present

## 2015-08-28 NOTE — Progress Notes (Signed)
Cardiology Office Note   Date:  08/29/2015   ID:  Chase Martin., DOB 05/11/1946, MRN [id number]  PCP:  Henrine Screws, MD    Chief Complaint  Patient presents with  . Mitral Valve Prolapse  . Hypertension      History of Present Illness: Chase Backes. is a 70 y.o. male with a history of bileaflet mitral valve prolapse with moderate MR, dilated aortic root and grade 2 diastolic dysfunction. He is doing well. He denies any chest pain or pressure, SOB, DOE, LE edema, dizziness or syncope. Occassionally he will notice a skipped heart beat.      Past Medical History  Diagnosis Date  . Allergic rhinitis   . Uvulitis 11/2000    acute - responded to augmentin  . Vertigo     episodic mild positional  . Seborrheic keratosis   . Actinic keratoses   . Epididymitis     recurrent  . BPH (benign prostatic hyperplasia)     mild  . Sigmoid diverticulosis 08/1999    noted on flexible sigmoidoscopy   . Folliculitis   . Hypertension     in the past - resolved  . Abdominal pain   . Generalized headaches     slight, dull  . ED (erectile dysfunction) 08/2011    cancelled f/u with Urology  . Dilated aortic root ([place])   . Left ventricular diastolic dysfunction, NYHA class 2   . MVP (mitral valve prolapse)     bileaflet with moderate eccentric MR    Past Surgical History  Procedure Laterality Date  . Tonsillectomy    . Pilonidal cyst excision    . Reconstruction of nose      secondary to deviated septum  . Laparoscopic small bowel resection N/A 08/11/2012    Procedure: Diagnostic Laparoscopy,  Diverticulectomy and laparoscopic appendectomy;  Surgeon: Stark Klein, MD;  Location: [facility];  Service: General;  Laterality: N/A;  Diagnostic Laparoscopy, Small Bowel Resection vs Diverticulectomy   . Laparoscopic appendectomy N/A 08/11/2012    Procedure: APPENDECTOMY LAPAROSCOPIC;  Surgeon: Stark Klein, MD;  Location: [facility];  Service: General;  Laterality:  N/A;  . Tee without cardioversion N/A 08/08/2013    Procedure: TRANSESOPHAGEAL ECHOCARDIOGRAM (TEE);  Surgeon: Sueanne Margarita, MD;  Location: [facility] ENDOSCOPY;  Service: Cardiovascular;  Laterality: N/A;     Current Outpatient Prescriptions  Medication Sig Dispense Refill  . aspirin EC 81 MG tablet Take 81 mg by mouth at bedtime.     . Cholecalciferol (VITAMIN D) 2000 UNITS tablet Take 4,000 Units by mouth daily.     . Cyanocobalamin (VITAMIN B12 PO) Take 500 mg by mouth daily.    . flunisolide (NASAREL) 29 MCG/ACT (0.025%) nasal spray Place 2 sprays into the nose 2 (two) times daily. Dose is for each nostril.    Marland Kitchen ibuprofen (ADVIL,MOTRIN) 200 MG tablet Take 400 mg by mouth every 6 (six) hours as needed. For pain    . losartan (COZAAR) 50 MG tablet Take 50 mg by mouth daily.     No current facility-administered medications for this visit.    Allergies:   Sulfa antibiotics; Ace inhibitors; and Antihistamines, chlorpheniramine-type    Social History:  The patient  reports that he has never smoked. He has never used smokeless tobacco. He reports that he drinks alcohol. He reports that he does not use illicit drugs.   Family History:  The patient's family history includes Cancer in his father, maternal aunt, maternal uncle, mother, and paternal uncle; Heart failure in his father.    ROS:  Please see the history of present illness.   Otherwise, review of systems are positive for none.   All other systems are reviewed and negative.    PHYSICAL EXAM: VS:  BP 134/90 mmHg  Pulse 60  Ht 6' (1.829 m)  Wt 204 lb 6.4 oz (92.715 kg)  BMI 27.72 kg/m2 , BMI Body mass index is 27.72 kg/(m^2). GEN: Well nourished, well developed, in no acute distress HEENT: normal Neck: no JVD, carotid bruits, or masses Cardiac: RRR; no rubs, or gallops,no edema.  Midsystolic click with late systolic murmur at apex Respiratory:  clear to auscultation bilaterally, normal work of breathing GI: soft, nontender,  nondistended, + BS MS: no deformity or atrophy Skin: warm and dry, no rash Neuro:  Strength and sensation are intact Psych: euthymic mood, full affect   EKG:  EKG is not ordered today.    Recent Labs: No results found for requested labs within last 365 days.    Lipid Panel No results found for: CHOL, TRIG, HDL, CHOLHDL, VLDL, LDLCALC, LDLDIRECT    Wt Readings from Last 3 Encounters:  08/29/15 204 lb 6.4 oz (92.715 kg)  02/07/15 202 lb 6.4 oz (91.808 kg)  08/15/14 204 lb (92.534 kg)    ASSESSMENT AND PLAN:  1. Severe bileaflet MVP with moderate eccentric MR by TEE a year ago. Will repeat 2D echo to assess for progression 08/2015 2. Dilated aortic root followed by yearly echo 3. HTN - controlled on Losartan 4. Diastolic dysfunction    Current medicines are reviewed at length with the patient today.  The patient does not have concerns regarding medicines.  The following changes have been made:  no change  Labs/ tests ordered today: See above Assessment and Plan No orders of the defined types were placed in this encounter.     Disposition:   FU with me in 6 months  Signed, Sueanne Margarita, MD  08/29/2015 11:13 AM    [facility] [place], [place], [place]  [address] Phone: [phone]; Fax: [phone]

## 2015-08-29 ENCOUNTER — Ambulatory Visit (INDEPENDENT_AMBULATORY_CARE_PROVIDER_SITE_OTHER): Payer: Medicare Other | Admitting: Cardiology

## 2015-08-29 ENCOUNTER — Encounter: Payer: Self-pay | Admitting: Cardiology

## 2015-08-29 VITALS — BP 134/90 | HR 60 | Ht 72.0 in | Wt 204.4 lb

## 2015-08-29 DIAGNOSIS — I10 Essential (primary) hypertension: Secondary | ICD-10-CM

## 2015-08-29 DIAGNOSIS — I341 Nonrheumatic mitral (valve) prolapse: Secondary | ICD-10-CM | POA: Diagnosis not present

## 2015-08-29 DIAGNOSIS — I340 Nonrheumatic mitral (valve) insufficiency: Secondary | ICD-10-CM | POA: Diagnosis not present

## 2015-08-29 DIAGNOSIS — I77810 Thoracic aortic ectasia: Secondary | ICD-10-CM | POA: Diagnosis not present

## 2015-08-29 NOTE — Patient Instructions (Signed)
Medication Instructions:  Your physician recommends that you continue on your current medications as directed. Please refer to the Current Medication list given to you today.   Labwork: None  Testing/Procedures: Your physician has requested that you have an echocardiogram. Echocardiography is a painless test that uses sound waves to create images of your heart. It provides your doctor with information about the size and shape of your heart and how well your heart's chambers and valves are working. This procedure takes approximately one hour. There are no restrictions for this procedure.  Follow-Up: Your physician wants you to follow-up in: 6 months with Dr. Turner. You will receive a reminder letter in the mail two months in advance. If you don't receive a letter, please call our office to schedule the follow-up appointment.   Any Other Special Instructions Will Be Listed Below (If Applicable).     If you need a refill on your cardiac medications before your next appointment, please call your pharmacy.   

## 2015-09-18 ENCOUNTER — Other Ambulatory Visit: Payer: Self-pay

## 2015-09-18 ENCOUNTER — Ambulatory Visit (HOSPITAL_COMMUNITY): Payer: Medicare Other | Attending: Cardiovascular Disease

## 2015-09-18 DIAGNOSIS — I340 Nonrheumatic mitral (valve) insufficiency: Secondary | ICD-10-CM | POA: Diagnosis not present

## 2015-09-18 DIAGNOSIS — I77810 Thoracic aortic ectasia: Secondary | ICD-10-CM | POA: Diagnosis not present

## 2015-09-18 DIAGNOSIS — I341 Nonrheumatic mitral (valve) prolapse: Secondary | ICD-10-CM | POA: Insufficient documentation

## 2015-09-18 DIAGNOSIS — I351 Nonrheumatic aortic (valve) insufficiency: Secondary | ICD-10-CM | POA: Diagnosis not present

## 2015-09-18 DIAGNOSIS — I119 Hypertensive heart disease without heart failure: Secondary | ICD-10-CM | POA: Insufficient documentation

## 2015-09-21 ENCOUNTER — Telehealth: Payer: Self-pay | Admitting: Cardiology

## 2015-09-21 NOTE — Telephone Encounter (Signed)
New message   Pt is calling for his ECHO results

## 2015-09-21 NOTE — Telephone Encounter (Signed)
Notified of echo results.  Will repeat in 1 year for MVP and MR

## 2015-09-24 ENCOUNTER — Telehealth: Payer: Self-pay

## 2015-09-24 DIAGNOSIS — I340 Nonrheumatic mitral (valve) insufficiency: Secondary | ICD-10-CM

## 2015-09-24 DIAGNOSIS — I341 Nonrheumatic mitral (valve) prolapse: Secondary | ICD-10-CM

## 2015-09-24 DIAGNOSIS — I77810 Thoracic aortic ectasia: Secondary | ICD-10-CM

## 2015-09-24 NOTE — Telephone Encounter (Signed)
Repeat ECHO ordered to be scheduled in one year. 

## 2015-09-24 NOTE — Telephone Encounter (Signed)
-----   Message from Sueanne Margarita, MD sent at 09/20/2015  6:45 PM EDT ----- Echo showed normal LVF with moderate LVH, grade I DD, trivial AR, MV prolapse of PMVL with mild MR -repeat echo in 1 year for MVP and MR

## 2016-03-05 DIAGNOSIS — H2513 Age-related nuclear cataract, bilateral: Secondary | ICD-10-CM | POA: Diagnosis not present

## 2016-03-12 DIAGNOSIS — I10 Essential (primary) hypertension: Secondary | ICD-10-CM | POA: Diagnosis not present

## 2016-04-02 ENCOUNTER — Encounter: Payer: Self-pay | Admitting: Cardiology

## 2016-04-02 ENCOUNTER — Ambulatory Visit (INDEPENDENT_AMBULATORY_CARE_PROVIDER_SITE_OTHER): Payer: Medicare Other | Admitting: Cardiology

## 2016-04-02 VITALS — BP 140/84 | HR 50 | Ht 72.0 in | Wt 200.8 lb

## 2016-04-02 DIAGNOSIS — Z23 Encounter for immunization: Secondary | ICD-10-CM | POA: Diagnosis not present

## 2016-04-02 DIAGNOSIS — I10 Essential (primary) hypertension: Secondary | ICD-10-CM

## 2016-04-02 DIAGNOSIS — E785 Hyperlipidemia, unspecified: Secondary | ICD-10-CM | POA: Diagnosis not present

## 2016-04-02 DIAGNOSIS — I77810 Thoracic aortic ectasia: Secondary | ICD-10-CM | POA: Diagnosis not present

## 2016-04-02 DIAGNOSIS — I341 Nonrheumatic mitral (valve) prolapse: Secondary | ICD-10-CM | POA: Diagnosis not present

## 2016-04-02 NOTE — Patient Instructions (Addendum)
Medication Instructions:  Your physician recommends that you continue on your current medications as directed. Please refer to the Current Medication list given to you today.   Labwork: None  Testing/Procedures: Your physician has requested that you have an echocardiogram in March, 2018. Echocardiography is a painless test that uses sound waves to create images of your heart. It provides your doctor with information about the size and shape of your heart and how well your heart's chambers and valves are working. This procedure takes approximately one hour. There are no restrictions for this procedure.   Dr. Radford Pax recommends you have a blood pressure monitor.  Follow-Up: Your physician wants you to follow-up in: 12 months with Dr. Radford Pax. You will receive a reminder letter in the mail two months in advance. If you don't receive a letter, please call our office to schedule the follow-up appointment.   Any Other Special Instructions Will Be Listed Below (If Applicable).     If you need a refill on your cardiac medications before your next appointment, please call your pharmacy.

## 2016-04-02 NOTE — Progress Notes (Signed)
Cardiology Office Note    Date:  04/02/2016   ID:  Chase Kulpa., DOB Nov 16, 1945, MRN [id number]  PCP:  Henrine Screws, MD  Cardiologist:  Fransico Him, MD   Chief Complaint  Patient presents with  . Mitral Regurgitation  . Hypertension    History of Present Illness:  Chase Curtner. is a 70 y.o. male with a history of bileaflet mitral valve prolapse with mild MR, dilated aortic root and grade 2 diastolic dysfunction. He is doing well. He denies any chest pain or pressure, SOB, DOE, LE edema, palpitations,  dizziness or syncope.     Past Medical History:  Diagnosis Date  . Abdominal pain   . Actinic keratoses   . Allergic rhinitis   . BPH (benign prostatic hyperplasia)    mild  . Dilated aortic root ([place])   . ED (erectile dysfunction) 08/2011   cancelled f/u with Urology  . Epididymitis    recurrent  . Folliculitis   . Generalized headaches    slight, dull  . Hypertension    in the past - resolved  . Left ventricular diastolic dysfunction, NYHA class 2   . MVP (mitral valve prolapse)    bileaflet with moderate eccentric MR  . Seborrheic keratosis   . Sigmoid diverticulosis 08/1999   noted on flexible sigmoidoscopy   . Uvulitis 11/2000   acute - responded to augmentin  . Vertigo    episodic mild positional    Past Surgical History:  Procedure Laterality Date  . LAPAROSCOPIC APPENDECTOMY N/A 08/11/2012   Procedure: APPENDECTOMY LAPAROSCOPIC;  Surgeon: Stark Klein, MD;  Location: [facility];  Service: General;  Laterality: N/A;  . LAPAROSCOPIC SMALL BOWEL RESECTION N/A 08/11/2012   Procedure: Diagnostic Laparoscopy,  Diverticulectomy and laparoscopic appendectomy;  Surgeon: Stark Klein, MD;  Location: [facility];  Service: General;  Laterality: N/A;  Diagnostic Laparoscopy, Small Bowel Resection vs Diverticulectomy   . PILONIDAL CYST EXCISION    . RECONSTRUCTION OF NOSE     secondary to deviated septum  . TEE WITHOUT CARDIOVERSION N/A 08/08/2013   Procedure: TRANSESOPHAGEAL ECHOCARDIOGRAM (TEE);  Surgeon: Sueanne Margarita, MD;  Location: [facility] ENDOSCOPY;  Service: Cardiovascular;  Laterality: N/A;  . TONSILLECTOMY      Current Medications: Outpatient Medications Prior to Visit  Medication Sig Dispense Refill  . aspirin EC 81 MG tablet Take 81 mg by mouth at bedtime.     . Cholecalciferol (VITAMIN D) 2000 UNITS tablet Take 4,000 Units by mouth daily.     . Cyanocobalamin (VITAMIN B12 PO) Take 500 mg by mouth daily.    . flunisolide (NASAREL) 29 MCG/ACT (0.025%) nasal spray Place 2 sprays into the nose 2 (two) times daily. Dose is for each nostril.    Marland Kitchen ibuprofen (ADVIL,MOTRIN) 200 MG tablet Take 400 mg by mouth every 6 (six) hours as needed. For pain    . losartan (COZAAR) 50 MG tablet Take 50 mg by mouth daily.     No facility-administered medications prior to visit.      Allergies:   Sulfa antibiotics; Ace inhibitors; and Antihistamines, chlorpheniramine-type   Social History   Social History  . Marital status: Married    Spouse name: N/A  . Number of children: N/A  . Years of education: N/A   Social History Main Topics  . Smoking status: Never Smoker  . Smokeless tobacco: Never Used  . Alcohol use Yes     Comment: one drink per day (wine or beer)  .  Drug use: No  . Sexual activity: Yes   Other Topics Concern  . None   Social History Narrative  . None     Family History:  The patient's family history includes Cancer in his father, maternal aunt, maternal uncle, mother, and paternal uncle; Heart failure in his father.   ROS:   Please see the history of present illness.    ROS All other systems reviewed and are negative.  No flowsheet data found.     PHYSICAL EXAM:   VS:  BP 140/84   Pulse (!) 50   Ht 6' (1.829 m)   Wt 200 lb 12.8 oz (91.1 kg)   BMI 27.23 kg/m    GEN: Well nourished, well developed, in no acute distress  HEENT: normal  Neck: no JVD, carotid bruits, or masses Cardiac: RRR; no murmurs,  rubs, or gallops,no edema.  Intact distal pulses bilaterally.  Respiratory:  clear to auscultation bilaterally, normal work of breathing GI: soft, nontender, nondistended, + BS MS: no deformity or atrophy  Skin: warm and dry, no rash Neuro:  Alert and Oriented x 3, Strength and sensation are intact Psych: euthymic mood, full affect  Wt Readings from Last 3 Encounters:  04/02/16 200 lb 12.8 oz (91.1 kg)  08/29/15 204 lb 6.4 oz (92.7 kg)  02/07/15 202 lb 6.4 oz (91.8 kg)      Studies/Labs Reviewed:   EKG:  EKG is ordered today and showed sinus bradycardia at 50bpm with no ST changes  Recent Labs: No results found for requested labs within last 8760 hours.   Lipid Panel No results found for: CHOL, TRIG, HDL, CHOLHDL, VLDL, LDLCALC, LDLDIRECT  Additional studies/ records that were reviewed today include:  none    ASSESSMENT:    1. MVP (mitral valve prolapse)   2. Benign essential HTN   3. Dilated aortic root ([facility])      PLAN:  In order of problems listed above:  1. MVP with mild MR - repeat echo in 1 year. 2. HTN - BP controlled on current meds.  Continue ARB.  He had a SBP one night of 128mmHg when he woke up feeling very hot. He is worried that his BP may not be controlled when he is sleeping.  He needs aggressive BP control due to his dilated aortic root.  I will get a 24 hour BP monitor to assess. 3. Dilated aortic root - 37mm - repeat echo 08/2016.    Medication Adjustments/Labs and Tests Ordered: Current medicines are reviewed at length with the patient today.  Concerns regarding medicines are outlined above.  Medication changes, Labs and Tests ordered today are listed in the Patient Instructions below.  There are no Patient Instructions on file for this visit.   Signed, Fransico Him, MD  04/02/2016 10:19 AM    [facility] [place], [place], [place]  [address] Phone: [phone]; Fax: [phone]

## 2016-04-29 ENCOUNTER — Ambulatory Visit: Payer: Medicare Other

## 2016-04-29 ENCOUNTER — Encounter: Payer: Self-pay | Admitting: *Deleted

## 2016-04-29 DIAGNOSIS — I10 Essential (primary) hypertension: Secondary | ICD-10-CM | POA: Diagnosis not present

## 2016-04-29 NOTE — Progress Notes (Signed)
Patient ID: Chase Smith., male   DOB: 08-19-1945, 70 y.o.   MRN: [id number]  24 hour ambulatory blood pressure monitor applied.

## 2016-05-03 ENCOUNTER — Encounter: Payer: Self-pay | Admitting: Cardiology

## 2016-05-15 DIAGNOSIS — E785 Hyperlipidemia, unspecified: Secondary | ICD-10-CM | POA: Diagnosis not present

## 2016-05-15 DIAGNOSIS — I10 Essential (primary) hypertension: Secondary | ICD-10-CM | POA: Diagnosis not present

## 2016-08-18 DIAGNOSIS — Z658 Other specified problems related to psychosocial circumstances: Secondary | ICD-10-CM | POA: Diagnosis not present

## 2016-08-18 DIAGNOSIS — Z1389 Encounter for screening for other disorder: Secondary | ICD-10-CM | POA: Diagnosis not present

## 2016-08-18 DIAGNOSIS — Z125 Encounter for screening for malignant neoplasm of prostate: Secondary | ICD-10-CM | POA: Diagnosis not present

## 2016-08-18 DIAGNOSIS — I10 Essential (primary) hypertension: Secondary | ICD-10-CM | POA: Diagnosis not present

## 2016-08-18 DIAGNOSIS — Z79899 Other long term (current) drug therapy: Secondary | ICD-10-CM | POA: Diagnosis not present

## 2016-08-18 DIAGNOSIS — Z0000 Encounter for general adult medical examination without abnormal findings: Secondary | ICD-10-CM | POA: Diagnosis not present

## 2016-08-18 DIAGNOSIS — I349 Nonrheumatic mitral valve disorder, unspecified: Secondary | ICD-10-CM | POA: Diagnosis not present

## 2016-08-18 DIAGNOSIS — E559 Vitamin D deficiency, unspecified: Secondary | ICD-10-CM | POA: Diagnosis not present

## 2016-08-18 DIAGNOSIS — E538 Deficiency of other specified B group vitamins: Secondary | ICD-10-CM | POA: Diagnosis not present

## 2016-08-18 DIAGNOSIS — E785 Hyperlipidemia, unspecified: Secondary | ICD-10-CM | POA: Diagnosis not present

## 2016-08-18 DIAGNOSIS — J309 Allergic rhinitis, unspecified: Secondary | ICD-10-CM | POA: Diagnosis not present

## 2016-09-03 ENCOUNTER — Other Ambulatory Visit: Payer: Self-pay

## 2016-09-03 ENCOUNTER — Ambulatory Visit (HOSPITAL_COMMUNITY): Payer: Medicare Other | Attending: Cardiology

## 2016-09-03 DIAGNOSIS — I340 Nonrheumatic mitral (valve) insufficiency: Secondary | ICD-10-CM

## 2016-09-03 DIAGNOSIS — I081 Rheumatic disorders of both mitral and tricuspid valves: Secondary | ICD-10-CM | POA: Insufficient documentation

## 2016-09-03 DIAGNOSIS — I341 Nonrheumatic mitral (valve) prolapse: Secondary | ICD-10-CM

## 2016-09-03 DIAGNOSIS — I77810 Thoracic aortic ectasia: Secondary | ICD-10-CM

## 2016-09-03 DIAGNOSIS — I10 Essential (primary) hypertension: Secondary | ICD-10-CM | POA: Diagnosis not present

## 2017-03-11 DIAGNOSIS — H2513 Age-related nuclear cataract, bilateral: Secondary | ICD-10-CM | POA: Diagnosis not present

## 2017-04-15 DIAGNOSIS — D225 Melanocytic nevi of trunk: Secondary | ICD-10-CM | POA: Diagnosis not present

## 2017-04-15 DIAGNOSIS — L821 Other seborrheic keratosis: Secondary | ICD-10-CM | POA: Diagnosis not present

## 2017-04-15 DIAGNOSIS — D1801 Hemangioma of skin and subcutaneous tissue: Secondary | ICD-10-CM | POA: Diagnosis not present

## 2017-04-15 DIAGNOSIS — Z85828 Personal history of other malignant neoplasm of skin: Secondary | ICD-10-CM | POA: Diagnosis not present

## 2017-04-27 ENCOUNTER — Telehealth: Payer: Self-pay | Admitting: Cardiology

## 2017-04-27 DIAGNOSIS — I499 Cardiac arrhythmia, unspecified: Secondary | ICD-10-CM | POA: Diagnosis not present

## 2017-04-27 DIAGNOSIS — Z136 Encounter for screening for cardiovascular disorders: Secondary | ICD-10-CM | POA: Diagnosis not present

## 2017-04-27 DIAGNOSIS — H65192 Other acute nonsuppurative otitis media, left ear: Secondary | ICD-10-CM | POA: Diagnosis not present

## 2017-04-27 DIAGNOSIS — I4891 Unspecified atrial fibrillation: Secondary | ICD-10-CM | POA: Diagnosis not present

## 2017-04-27 NOTE — Telephone Encounter (Signed)
Patient stated he is at his PCP's office and had an irregular HR. PCP did an EKG that showed A. Fib. Patient stated they are starting him on eliquis and a beta blocker. Patient stated he was suppose to follow up with his cardiologist sooner than his scheduled appointment with Dr. Radford Pax on 05/15/17. Made an appointment with Truitt Merle NP for tomorrow. Requested patient to bring a copy of EKG that was done today. Patient stated they gave him a copy, and he will bring it to his appointment.

## 2017-04-27 NOTE — Telephone Encounter (Signed)
Patient c/o Palpitations:  High priority if patient c/o lightheadedness, shortness of breath, or chest pain  1) How long have you had palpitations/irregular HR/ Afib? Are you having the symptoms now? Today for several hours  2) Are you currently experiencing lightheadedness, SOB or CP? no  Do you have a history of afib (atrial fibrillation) or irregular heart rhythm? no Have you checked your BP or HR? (document readings if available): bp 122/68    hr 120 3) Are you experiencing any other symptoms? Lightheaded

## 2017-04-28 ENCOUNTER — Encounter: Payer: Self-pay | Admitting: Nurse Practitioner

## 2017-04-28 ENCOUNTER — Telehealth: Payer: Self-pay | Admitting: *Deleted

## 2017-04-28 ENCOUNTER — Ambulatory Visit (INDEPENDENT_AMBULATORY_CARE_PROVIDER_SITE_OTHER): Payer: Medicare Other | Admitting: Nurse Practitioner

## 2017-04-28 VITALS — BP 110/90 | HR 64 | Ht 72.0 in | Wt 201.1 lb

## 2017-04-28 DIAGNOSIS — I4891 Unspecified atrial fibrillation: Secondary | ICD-10-CM

## 2017-04-28 DIAGNOSIS — I77810 Thoracic aortic ectasia: Secondary | ICD-10-CM

## 2017-04-28 DIAGNOSIS — I10 Essential (primary) hypertension: Secondary | ICD-10-CM

## 2017-04-28 DIAGNOSIS — I341 Nonrheumatic mitral (valve) prolapse: Secondary | ICD-10-CM | POA: Diagnosis not present

## 2017-04-28 DIAGNOSIS — I480 Paroxysmal atrial fibrillation: Secondary | ICD-10-CM

## 2017-04-28 DIAGNOSIS — I340 Nonrheumatic mitral (valve) insufficiency: Secondary | ICD-10-CM | POA: Diagnosis not present

## 2017-04-28 DIAGNOSIS — H6592 Unspecified nonsuppurative otitis media, left ear: Secondary | ICD-10-CM | POA: Diagnosis not present

## 2017-04-28 NOTE — Patient Instructions (Addendum)
We will be checking the following labs today - NONE   Medication Instructions:    Continue with your current medicines.   Samples of Eliquis if possible  Stay on the Coreg twice a day and the Eliquis twice a day  STOP aspirin  Tylenol as needed for pain/discomfort    Testing/Procedures To Be Arranged:  Will get your echocardiogram repeated  Will have a sleep study arranged  Follow-Up:   See Dr. Radford Pax as planned    Other Special Instructions:   N/A    If you need a refill on your cardiac medications before your next appointment, please call your pharmacy.   Call the [place] office at [phone] if you have any questions, problems or concerns.

## 2017-04-28 NOTE — Progress Notes (Signed)
CARDIOLOGY OFFICE NOTE  Date:  04/28/2017    Chase Smith. Date of Birth: [birth year] Medical Record #[id number]  PCP:  Josetta Huddle, MD  Cardiologist:  Radford Pax   Chief Complaint  Patient presents with  . Atrial Fibrillation    Work in visit - seen for Dr. Radford Pax    History of Present Illness: Chase Smith. is a 71 y.o. male who presents today for a work in visit. Seen for Dr. Radford Pax.   He has a history of HTN, bileaflet mitral valve prolapse with mild MR, dilated aortic root (but not seen on echo from 09/5807) and diastolic dysfunction. Last echo was from March of 2018.   He was last seen in October of 2017 by Dr. Radford Pax. Felt to be doing well.   Phone call earlier this week - "Patient stated he is at his PCP's office and had an irregular HR. PCP did an EKG that showed A. Fib. Patient stated they are starting him on eliquis and a beta blocker. Patient stated he was suppose to follow up with his cardiologist sooner than his scheduled appointment with Dr. Radford Pax on 05/15/17. Made an appointment with Truitt Merle NP for tomorrow. Requested patient to bring a copy of EKG that was done today. Patient stated they gave him a copy, and he will bring it to his appointment."  Thus added to my schedule for today.    Comes in today. Here alone. He brings in an EKG from yesterday showing AF. EKG here today with NSR. Had been to his PCP - was feeling "tense" some "fluttering". Had just returned from long drive from [place] - oldest daughter lives there. Not sleeping well. Was going for a flu shot and had an ear issue as well - apparently has fluid in his ear. When checking his vitals - found to have irregular and fast pulse. Norvasc was stopped. Started on Coreg and Eliquis. Lots of stress - his youngest daughter died from a PE back in 21-Aug-2022 - she was 68. He does not sleep well. Does snore. Does not feel rested. He tries to stay active. Does not use excessive caffeine or alcohol.  Labs yesterday which included thyroid study "ok".   Past Medical History:  Diagnosis Date  . Abdominal pain   . Actinic keratoses   . Allergic rhinitis   . BPH (benign prostatic hyperplasia)    mild  . Dilated aortic root ([place])    82mm on echo 08/2015  . ED (erectile dysfunction) 08/2011   cancelled f/u with Urology  . Epididymitis    recurrent  . Folliculitis   . Generalized headaches    slight, dull  . Hypertension    in the past - resolved  . Left ventricular diastolic dysfunction, NYHA class 2   . MVP (mitral valve prolapse)    with mild MR by echo 08/2015  . Seborrheic keratosis   . Sigmoid diverticulosis 08/1999   noted on flexible sigmoidoscopy   . Uvulitis 11/2000   acute - responded to augmentin  . Vertigo    episodic mild positional    Past Surgical History:  Procedure Laterality Date  . LAPAROSCOPIC APPENDECTOMY N/A 08/11/2012   Procedure: APPENDECTOMY LAPAROSCOPIC;  Surgeon: Stark Klein, MD;  Location: [facility];  Service: General;  Laterality: N/A;  . LAPAROSCOPIC SMALL BOWEL RESECTION N/A 08/11/2012   Procedure: Diagnostic Laparoscopy,  Diverticulectomy and laparoscopic appendectomy;  Surgeon: Stark Klein, MD;  Location: [facility];  Service: General;  Laterality: N/A;  Diagnostic Laparoscopy, Small Bowel Resection vs Diverticulectomy   . PILONIDAL CYST EXCISION    . RECONSTRUCTION OF NOSE     secondary to deviated septum  . TEE WITHOUT CARDIOVERSION N/A 08/08/2013   Procedure: TRANSESOPHAGEAL ECHOCARDIOGRAM (TEE);  Surgeon: Sueanne Margarita, MD;  Location: [facility];  Service: Cardiovascular;  Laterality: N/A;  . TONSILLECTOMY       Medications: Current Meds  Medication Sig  . mometasone (NASONEX) 50 MCG/ACT nasal spray Place 2 sprays into the nose daily.     Allergies: Allergies  Allergen Reactions  . Sulfa Antibiotics Other (See Comments)    Severe lethargy  . Ace Inhibitors Cough  . Antihistamines, Chlorpheniramine-Type Other (See Comments)    Trouble  urinating. Patient st "all antihistamines" cause this.     Social History: The patient  reports that he has never smoked. He has never used smokeless tobacco. He reports that he drinks alcohol. He reports that he does not use drugs.   Family History: The patient's family history includes Cancer in his father, maternal aunt, maternal uncle, mother, and paternal uncle; Heart failure in his father.   Review of Systems: Please see the history of present illness.   Otherwise, the review of systems is positive for none.   All other systems are reviewed and negative.   Physical Exam: VS:  BP 110/90   Pulse 64   Ht 6' (1.829 m)   Wt 201 lb 1.9 oz (91.2 kg)   BMI 27.28 kg/m  .  BMI Body mass index is 27.28 kg/m.  Wt Readings from Last 3 Encounters:  04/28/17 201 lb 1.9 oz (91.2 kg)  04/02/16 200 lb 12.8 oz (91.1 kg)  08/29/15 204 lb 6.4 oz (92.7 kg)    General: Pleasant. He was very tearful during the visit when speaking of his daughger. Alert and in no acute distress.   HEENT: Normal.  Neck: Supple, no JVD, carotid bruits, or masses noted.  Cardiac: Regular rate and rhythm. Loud systolic murmur at the apex noted. No edema.  Respiratory:  Lungs are clear to auscultation bilaterally with normal work of breathing.  GI: Soft and nontender.  MS: No deformity or atrophy. Gait and ROM intact.  Skin: Warm and dry. Color is normal.  Neuro:  Strength and sensation are intact and no gross focal deficits noted.  Psych: Alert, appropriate and with normal affect.   LABORATORY DATA:  EKG:  EKG is ordered today. This demonstrates NSR today.   Lab Results  Component Value Date   WBC 6.4 08/03/2013   HGB 15.4 08/03/2013   HCT 47.3 08/03/2013   PLT 190.0 08/03/2013   GLUCOSE 76 08/03/2013   ALT 15 07/10/2012   AST 17 07/10/2012   NA 138 08/03/2013   K 3.7 08/03/2013   CL 105 08/03/2013   CREATININE 1.0 08/03/2013   BUN 15 08/03/2013   CO2 26 08/03/2013   INR 1.1 (H) 08/03/2013      BNP (last 3 results) No results for input(s): BNP in the last 8760 hours.  ProBNP (last 3 results) No results for input(s): PROBNP in the last 8760 hours.   Other Studies Reviewed Today:  Echo Study Conclusions 08/2016  - Left ventricle: The cavity size was normal. There was moderate   concentric hypertrophy. Systolic function was vigorous. The   estimated ejection fraction was in the range of 65% to 70%. Wall   motion was normal; there were no regional wall motion   abnormalities.  Doppler parameters are consistent with abnormal   left ventricular relaxation (grade 1 diastolic dysfunction).   Doppler parameters are consistent with elevated ventricular   end-diastolic filling pressure. - Aortic valve: Trileaflet; normal thickness leaflets. There was no   regurgitation. - Aortic root: The aortic root was normal in size. - Mitral valve: Posterior leaflet prolapse. There was mild   regurgitation directed centrally and anteriorly. - Left atrium: The atrium was normal in size. - Right ventricle: The cavity size was normal. Wall thickness was   normal. Systolic function was normal. - Tricuspid valve: There was mild regurgitation. - Pulmonary arteries: Systolic pressure was within the normal   range. - Inferior vena cava: The vessel was normal in size. The   respirophasic diameter changes were in the normal range (= 50%),   consistent with normal central venous pressure. - Pericardium, extracardiac: There was no pericardial effusion.  Impressions:  - There is no significant difference since the prior study on   08/21/2015, mitral regurgitation remains mild.   Assessment/Plan:  1. New onset AF - back in NSR on EKG here today. CHADSVASC is 2 (HTN/age). On Eliquis. Explained that this is a condition that is managed. Need to make sure he does not have OSA which could be a contributor. I think his stress is playing a role - understandably. His labs from PCP yesterday reportedly  ok. Would like to get his echo updated to make sure we do not have progressive of MV disease and that this is the reason for his AF. Will see Dr. Radford Pax back as planned. Continue with the Coreg and Eliquis. Stop aspirin. Tylenol prn. Reminded about risk of bleeding.   2. MVP with MR - getting echo updated in light of new onset AF.   3. HTN - BP ok here - no changes made today.   4. Dilated aortic root - noted "normal size" on most recent echo - would follow.    Current medicines are reviewed with the patient today.  The patient does not have concerns regarding medicines other than what has been noted above.  The following changes have been made:  See above.  Labs/ tests ordered today include:   No orders of the defined types were placed in this encounter.    Disposition:   FU with Dr. Radford Pax as planned in November.    Patient is agreeable to this plan and will call if any problems develop in the interim.   SignedTruitt Merle, NP  04/28/2017 10:41 AM  [facility] [address] [place], [place]  [address] Phone: [phone] Fax: [phone]

## 2017-04-28 NOTE — Telephone Encounter (Signed)
-----   Message from Burtis Junes, NP sent at 04/28/2017 10:44 AM EDT ----- Please arrange sleep study   lori

## 2017-05-01 ENCOUNTER — Encounter: Payer: Self-pay | Admitting: *Deleted

## 2017-05-06 ENCOUNTER — Other Ambulatory Visit (HOSPITAL_COMMUNITY): Payer: Medicare Other

## 2017-05-07 ENCOUNTER — Ambulatory Visit (HOSPITAL_COMMUNITY): Payer: Medicare Other | Attending: Cardiology

## 2017-05-07 ENCOUNTER — Other Ambulatory Visit: Payer: Self-pay

## 2017-05-07 DIAGNOSIS — I341 Nonrheumatic mitral (valve) prolapse: Secondary | ICD-10-CM | POA: Diagnosis not present

## 2017-05-07 DIAGNOSIS — I10 Essential (primary) hypertension: Secondary | ICD-10-CM | POA: Diagnosis not present

## 2017-05-07 DIAGNOSIS — I340 Nonrheumatic mitral (valve) insufficiency: Secondary | ICD-10-CM | POA: Diagnosis not present

## 2017-05-07 DIAGNOSIS — I4891 Unspecified atrial fibrillation: Secondary | ICD-10-CM | POA: Diagnosis not present

## 2017-05-08 ENCOUNTER — Telehealth: Payer: Self-pay | Admitting: Nurse Practitioner

## 2017-05-08 NOTE — Telephone Encounter (Signed)
Notes recorded by Burtis Junes, NP on 05/08/2017 at 7:47 AM EST Ok to report.  His echo shows normal pumping function Moderate amount of leakage from the mitral valve.  Needs to keep his follow up with Dr. Radford Pax for next week for further discussion.

## 2017-05-08 NOTE — Telephone Encounter (Signed)
Called pt with results.  He will keep appt as scheduled.

## 2017-05-08 NOTE — Telephone Encounter (Signed)
Returning a call about his Echo Results

## 2017-05-08 NOTE — Telephone Encounter (Signed)
Informed patient of upcoming sleep study and patient understanding was verbalized. Patient understands his sleep study is scheduled for Monday June 08 2017. Patient understands his sleep study will be done at [facility] sleep lab. Patient understands he will receive a sleep packet in a week or so. Patient understands to call if he does not receive the sleep packet in a timely manner. Patient agrees with treatment and thanked me for call.

## 2017-05-12 NOTE — Telephone Encounter (Signed)
Patient called today to cancel his sleep study for 06/08/2017.Chase Smith Patient states he will call back at a latter date to reschedule. Appointment cancelled and sleep lab notified ([facility]).

## 2017-05-13 ENCOUNTER — Encounter: Payer: Self-pay | Admitting: Cardiology

## 2017-05-13 ENCOUNTER — Ambulatory Visit (INDEPENDENT_AMBULATORY_CARE_PROVIDER_SITE_OTHER): Payer: Medicare Other | Admitting: Cardiology

## 2017-05-13 VITALS — BP 136/86 | HR 59 | Ht 72.0 in | Wt 205.2 lb

## 2017-05-13 DIAGNOSIS — N649 Disorder of breast, unspecified: Secondary | ICD-10-CM | POA: Diagnosis not present

## 2017-05-13 DIAGNOSIS — I10 Essential (primary) hypertension: Secondary | ICD-10-CM

## 2017-05-13 DIAGNOSIS — I480 Paroxysmal atrial fibrillation: Secondary | ICD-10-CM | POA: Diagnosis not present

## 2017-05-13 DIAGNOSIS — I349 Nonrheumatic mitral valve disorder, unspecified: Secondary | ICD-10-CM | POA: Diagnosis not present

## 2017-05-13 DIAGNOSIS — I340 Nonrheumatic mitral (valve) insufficiency: Secondary | ICD-10-CM | POA: Diagnosis not present

## 2017-05-13 DIAGNOSIS — I77810 Thoracic aortic ectasia: Secondary | ICD-10-CM

## 2017-05-13 DIAGNOSIS — I4891 Unspecified atrial fibrillation: Secondary | ICD-10-CM | POA: Diagnosis not present

## 2017-05-13 DIAGNOSIS — M25512 Pain in left shoulder: Secondary | ICD-10-CM | POA: Diagnosis not present

## 2017-05-13 DIAGNOSIS — R0789 Other chest pain: Secondary | ICD-10-CM | POA: Diagnosis not present

## 2017-05-13 HISTORY — DX: Paroxysmal atrial fibrillation: I48.0

## 2017-05-13 NOTE — Patient Instructions (Signed)
Medication Instructions:  Your physician recommends that you continue on your current medications as directed. Please refer to the Current Medication list given to you today.  Labwork: None ordered   Testing/Procedures: Your physician has requested that you have an echocardiogram in 6 months. Echocardiography is a painless test that uses sound waves to create images of your heart. It provides your doctor with information about the size and shape of your heart and how well your heart's chambers and valves are working. This procedure takes approximately one hour. There are no restrictions for this procedure.  Your physician has recommended that you have a sleep study. This test records several body functions during sleep, including: brain activity, eye movement, oxygen and carbon dioxide blood levels, heart rate and rhythm, breathing rate and rhythm, the flow of air through your mouth and nose, snoring, body muscle movements, and chest and belly movement.   Follow-Up: Your physician wants you to follow-up in: 6 months with Dr. Radford Pax. You will receive a reminder letter in the mail two months in advance. If you don't receive a letter, please call our office to schedule the follow-up appointment.   Any Other Special Instructions Will Be Listed Below (If Applicable).     If you need a refill on your cardiac medications before your next appointment, please call your pharmacy.

## 2017-05-13 NOTE — Progress Notes (Signed)
Cardiology Office Note:    Date:  05/13/2017   ID:  Chase Martin., DOB 06/21/1946, MRN [id number]  PCP:  Josetta Huddle, MD  Cardiologist:  Fransico Him, MD   Referring MD: Josetta Huddle, MD   Chief Complaint  Patient presents with  . Follow-up    PAF, MR    History of Present Illness:    Chase Ruddy. is a 72 y.o. male with a hx of bileaflet mitral valve prolapse with mild MR, dilated aortic root and grade 2 diastolic dysfunction.  He was recently diagnosed with new onset afib by his PCP and was started on BB and Eliquis.  He was seen by Truitt Merle, NP and was back in NSR.  2D echo showed normal LVF with EF 60-65% with moderate holosystolic MVP of the posterior MV leaflet with moderate MR that was eccentric. She  is here today for followup and is doing well.  She denies any chest pain or pressure, SOB, DOE, PND, orthopnea, LE edema, dizziness, palpitations or syncope. She is compliant with her meds and is tolerating meds with no SE.     Past Medical History:  Diagnosis Date  . Abdominal pain   . Actinic keratoses   . Allergic rhinitis   . BPH (benign prostatic hyperplasia)    mild  . Dilated aortic root ([place])    10mm by echo 01/2017  . ED (erectile dysfunction) 08/2011   cancelled f/u with Urology  . Epididymitis    recurrent  . Folliculitis   . Generalized headaches    slight, dull  . Hypertension    in the past - resolved  . Left ventricular diastolic dysfunction, NYHA class 2   . MVP (mitral valve prolapse)    MVP of the PMV leaflet with moderate MR by echo 01/2017  . PAF (paroxysmal atrial fibrillation) ([place]) 05/13/2017   CHADS2VASC score is 2 (HTN and age > 72) on Apixaban  . Seborrheic keratosis   . Sigmoid diverticulosis 08/1999   noted on flexible sigmoidoscopy   . Uvulitis 11/2000   acute - responded to augmentin  . Vertigo    episodic mild positional    Past Surgical History:  Procedure Laterality Date  . PILONIDAL CYST EXCISION    .  RECONSTRUCTION OF NOSE     secondary to deviated septum  . TONSILLECTOMY      Current Medications: Current Meds  Medication Sig  . carvedilol (COREG) 3.125 MG tablet Take 3.125 mg by mouth 2 (two) times daily.  . Cholecalciferol (VITAMIN D) 2000 UNITS tablet Take 2,000 Units daily by mouth.   . Coenzyme Q10 (CO Q-10 PO) Take 1 capsule by mouth daily.  . Cyanocobalamin (VITAMIN B12 PO) Take 500 mg by mouth daily.  Marland Kitchen ELIQUIS 5 MG TABS tablet Take 5 mg by mouth 2 (two) times daily.  Marland Kitchen losartan (COZAAR) 50 MG tablet Take 50 mg by mouth daily.  . mometasone (NASONEX) 50 MCG/ACT nasal spray Place 2 sprays daily as needed into the nose (Allergies).   . rosuvastatin (CRESTOR) 5 MG tablet Take 5 mg by mouth 3 (three) times a week.      Allergies:   Sulfa antibiotics; Ace inhibitors; and Antihistamines, chlorpheniramine-type   Social History   Socioeconomic History  . Marital status: Married    Spouse name: None  . Number of children: None  . Years of education: None  . Highest education level: None  Social Needs  . Financial resource strain: None  .  Food insecurity - worry: None  . Food insecurity - inability: None  . Transportation needs - medical: None  . Transportation needs - non-medical: None  Occupational History  . None  Tobacco Use  . Smoking status: Never Smoker  . Smokeless tobacco: Never Used  Substance and Sexual Activity  . Alcohol use: Yes    Comment: one drink per day (wine or beer)  . Drug use: No  . Sexual activity: Yes  Other Topics Concern  . None  Social History Narrative  . None     Family History: The patient's family history includes Cancer in his father, maternal aunt, maternal uncle, mother, and paternal uncle; Heart failure in his father.  ROS:   Please see the history of present illness.    ROS  All other systems reviewed and negative.   EKGs/Labs/Other Studies Reviewed:    The following studies were reviewed today: none  EKG:  EKG is  not ordered today.    Recent Labs: No results found for requested labs within last 8760 hours.   Recent Lipid Panel No results found for: CHOL, TRIG, HDL, CHOLHDL, VLDL, LDLCALC, LDLDIRECT  Physical Exam:    VS:  BP 136/86   Pulse (!) 59   Ht 6' (1.829 m)   Wt 205 lb 3.2 oz (93.1 kg)   BMI 27.83 kg/m     Wt Readings from Last 3 Encounters:  05/13/17 205 lb 3.2 oz (93.1 kg)  04/28/17 201 lb 1.9 oz (91.2 kg)  04/02/16 200 lb 12.8 oz (91.1 kg)     GEN:  Well nourished, well developed in no acute distress HEENT: Normal NECK: No JVD; No carotid bruits LYMPHATICS: No lymphadenopathy CARDIAC: irregularly irregular, no murmurs, rubs, gallops RESPIRATORY:  Clear to auscultation without rales, wheezing or rhonchi  ABDOMEN: Soft, non-tender, non-distended MUSCULOSKELETAL:  No edema; No deformity  SKIN: Warm and dry NEUROLOGIC:  Alert and oriented x 3 PSYCHIATRIC:  Normal affect   ASSESSMENT:    1. Moderate mitral regurgitation   2. Dilated aortic root ([place])   3. Benign essential HTN   4. PAF (paroxysmal atrial fibrillation) ([facility])    PLAN:    In order of problems listed above:  1.  Moderate MR with MVP of the posterior MV leaflet.  Recent 2D echo showed normal LVF and dimensions and moderate MR with eccentric jet.  There was no pulmonary HTN.  I will repeat a 2D echo in 6 months to evaluate for progression.    2.  Dilated aortic root - 2D echo showed aortic root to be 3mm.    3.  HTN - BP is well controlled on exam today.  He will continue on losartan 50mg  daily, carvedilol 3.125mg  BID.  4.  PAF - he remains in NSR.  He will continue on Eliquis and BB.  CHADS2VASC score is 2.  He has a sleep study pending.     Medication Adjustments/Labs and Tests Ordered: Current medicines are reviewed at length with the patient today.  Concerns regarding medicines are outlined above.  No orders of the defined types were placed in this encounter.  No orders of the defined types were  placed in this encounter.   Signed, Fransico Him, MD  05/13/2017 2:02 PM    [place]

## 2017-05-13 NOTE — Telephone Encounter (Signed)
sleep study  Teressa Senter, RN  Freada Bergeron, CMA        Sleep study order placed.   Thanks  [employer]

## 2017-05-15 ENCOUNTER — Ambulatory Visit: Payer: Medicare Other | Admitting: Cardiology

## 2017-05-15 DIAGNOSIS — H6590 Unspecified nonsuppurative otitis media, unspecified ear: Secondary | ICD-10-CM | POA: Diagnosis not present

## 2017-05-27 DIAGNOSIS — H6983 Other specified disorders of Eustachian tube, bilateral: Secondary | ICD-10-CM | POA: Diagnosis not present

## 2017-06-08 ENCOUNTER — Encounter (HOSPITAL_BASED_OUTPATIENT_CLINIC_OR_DEPARTMENT_OTHER): Payer: Medicare Other

## 2017-06-18 NOTE — Addendum Note (Signed)
Addended by: Freada Bergeron on: 06/18/2017 02:44 PM   Modules accepted: Orders

## 2017-07-10 DIAGNOSIS — H2513 Age-related nuclear cataract, bilateral: Secondary | ICD-10-CM | POA: Diagnosis not present

## 2017-07-10 DIAGNOSIS — H11002 Unspecified pterygium of left eye: Secondary | ICD-10-CM | POA: Diagnosis not present

## 2017-07-10 DIAGNOSIS — H538 Other visual disturbances: Secondary | ICD-10-CM | POA: Diagnosis not present

## 2017-07-10 DIAGNOSIS — H52203 Unspecified astigmatism, bilateral: Secondary | ICD-10-CM | POA: Diagnosis not present

## 2017-07-13 ENCOUNTER — Other Ambulatory Visit: Payer: Self-pay | Admitting: Dermatology

## 2017-07-13 DIAGNOSIS — L820 Inflamed seborrheic keratosis: Secondary | ICD-10-CM | POA: Diagnosis not present

## 2017-07-13 DIAGNOSIS — C44311 Basal cell carcinoma of skin of nose: Secondary | ICD-10-CM | POA: Diagnosis not present

## 2017-08-07 ENCOUNTER — Telehealth: Payer: Self-pay | Admitting: Cardiology

## 2017-08-07 NOTE — Telephone Encounter (Signed)
Patient is to have eye surgery on Tuesday.  Contacted by Dr. Kathrin Penner and procedure is a high risk bleed procedure.  OK to hold Eliquis for procedure - please call patient and tell him to hold Eliquis starting tomorrow.

## 2017-08-07 NOTE — Telephone Encounter (Signed)
Called patient with Dr. Theodosia Blender message. Informed patient to hold his Eliquis starting on tomorrow for his surgery on Tuesday with Dr. Estevan Oaks. Patient verbalized understanding.

## 2017-08-11 ENCOUNTER — Telehealth: Payer: Self-pay | Admitting: *Deleted

## 2017-08-11 DIAGNOSIS — H11009 Unspecified pterygium of unspecified eye: Secondary | ICD-10-CM | POA: Diagnosis not present

## 2017-08-11 DIAGNOSIS — H11002 Unspecified pterygium of left eye: Secondary | ICD-10-CM | POA: Diagnosis not present

## 2017-08-11 DIAGNOSIS — H11062 Recurrent pterygium of left eye: Secondary | ICD-10-CM | POA: Diagnosis not present

## 2017-08-11 NOTE — Telephone Encounter (Signed)
Informed patient of upcoming sleep study and patient understanding was verbalized. Patient understands his sleep study is scheduled for Tuesday August 25 2017. Patient understands his sleep study will be done at [facility] sleep lab. Patient understands he will receive a sleep packet in a week or so. Patient understands to call if he does not receive the sleep packet in a timely manner. Patient agrees with treatment and thanked me for call.

## 2017-08-21 DIAGNOSIS — Z1211 Encounter for screening for malignant neoplasm of colon: Secondary | ICD-10-CM | POA: Diagnosis not present

## 2017-08-21 DIAGNOSIS — Z79899 Other long term (current) drug therapy: Secondary | ICD-10-CM | POA: Diagnosis not present

## 2017-08-21 DIAGNOSIS — Z125 Encounter for screening for malignant neoplasm of prostate: Secondary | ICD-10-CM | POA: Diagnosis not present

## 2017-08-21 DIAGNOSIS — Z1389 Encounter for screening for other disorder: Secondary | ICD-10-CM | POA: Diagnosis not present

## 2017-08-21 DIAGNOSIS — E559 Vitamin D deficiency, unspecified: Secondary | ICD-10-CM | POA: Diagnosis not present

## 2017-08-21 DIAGNOSIS — I4891 Unspecified atrial fibrillation: Secondary | ICD-10-CM | POA: Diagnosis not present

## 2017-08-21 DIAGNOSIS — I340 Nonrheumatic mitral (valve) insufficiency: Secondary | ICD-10-CM | POA: Diagnosis not present

## 2017-08-21 DIAGNOSIS — Z0000 Encounter for general adult medical examination without abnormal findings: Secondary | ICD-10-CM | POA: Diagnosis not present

## 2017-08-21 DIAGNOSIS — I10 Essential (primary) hypertension: Secondary | ICD-10-CM | POA: Diagnosis not present

## 2017-08-21 DIAGNOSIS — M25512 Pain in left shoulder: Secondary | ICD-10-CM | POA: Diagnosis not present

## 2017-08-21 DIAGNOSIS — I349 Nonrheumatic mitral valve disorder, unspecified: Secondary | ICD-10-CM | POA: Diagnosis not present

## 2017-08-21 DIAGNOSIS — R0789 Other chest pain: Secondary | ICD-10-CM | POA: Diagnosis not present

## 2017-08-25 ENCOUNTER — Encounter (HOSPITAL_BASED_OUTPATIENT_CLINIC_OR_DEPARTMENT_OTHER): Payer: Medicare Other

## 2017-09-21 DIAGNOSIS — Z1211 Encounter for screening for malignant neoplasm of colon: Secondary | ICD-10-CM | POA: Diagnosis not present

## 2017-09-21 DIAGNOSIS — Z1212 Encounter for screening for malignant neoplasm of rectum: Secondary | ICD-10-CM | POA: Diagnosis not present

## 2017-10-01 NOTE — Telephone Encounter (Signed)
Patient had split night scheduled for 06/08/17 and 08/25/17. Patient cancelled both appt and has not indicated that he would like to reschedule.

## 2017-10-08 DIAGNOSIS — R195 Other fecal abnormalities: Secondary | ICD-10-CM | POA: Diagnosis not present

## 2017-10-12 DIAGNOSIS — H1789 Other corneal scars and opacities: Secondary | ICD-10-CM | POA: Diagnosis not present

## 2017-10-12 DIAGNOSIS — H1132 Conjunctival hemorrhage, left eye: Secondary | ICD-10-CM | POA: Diagnosis not present

## 2017-10-22 ENCOUNTER — Telehealth: Payer: Self-pay

## 2017-10-22 NOTE — Telephone Encounter (Signed)
   [facility] Pre-operative Risk Assessment    Request for surgical clearance:  1. What type of surgery is being performed? Colonoscopy   2. When is this surgery scheduled? 10/28/17   3. What type of clearance is required (medical clearance vs. Pharmacy clearance to hold med vs. Both)? both  4. Are there any medications that need to be held prior to surgery and how long? Eliquis, "1 day before?"   5. Practice name and name of physician performing surgery? [facility], Dr. Therisa Doyne    6. What is your office phone number [phone]    7.   What is your office fax number [phone]  8.   Anesthesia type (None, local, MAC, general) ? Not listed    Chase Smith 10/22/2017, 1:31 PM  _________________________________________________________________   (provider comments below)

## 2017-10-22 NOTE — Telephone Encounter (Signed)
     Primary Cardiologist:Traci Turner, MD  Chart reviewed as part of pre-operative protocol coverage. Routing to pharmacy regarding input on anticoagulation, then will need formal clearance review.   Charlie Pitter, PA-C 10/22/2017, 2:30 PM

## 2017-10-22 NOTE — Telephone Encounter (Signed)
Pt takes Eliquis for afib with CHADS2VASc score of 3 (age, CHF (diastolic dysfunction), HTN). SCr 0.93 checked at [facility] 07/2017. Ok to hold Eliquis for 1 day prior to procedure as requested.

## 2017-10-22 NOTE — Telephone Encounter (Signed)
   Primary Cardiologist:Traci Turner, MD  Chart reviewed as part of pre-operative protocol coverage. H/o PAF, MVP, moderate MR, dilated aortic root, diastolic dysfunction. At last OV 04/2017 with Dr. Radford Pax, patient was doing well. She recommended echo in 6 months to reassess along with f/u appointment. Has f/u scheduled 11/2017. Called patient to discuss how he is doing - feeling great without CP, SOB or palpitations. Denies any interim afib. Carries hx of diastolic dysfunction but does not appear to have had clinical CHF before. RCRI is calculated at 0.4%. Based on ACC/AHA guidelines, Tim Corriher. would be at acceptable risk for the planned procedure without further cardiovascular testing.   The surgical team requested to hold Eliquis for 1 day. Per pharmacist Supple's recommendations, "Pt takes Eliquis for afib with CHADS2VASc score of 3 (age, CHF (diastolic dysfunction), HTN). SCr 0.93 checked at [facility] 07/2017. Ok to hold Eliquis for 1 day prior to procedure as requested."  Charlie Pitter, PA-C 10/22/2017, 4:32 PM

## 2017-10-28 DIAGNOSIS — R195 Other fecal abnormalities: Secondary | ICD-10-CM | POA: Diagnosis not present

## 2017-10-28 DIAGNOSIS — K5730 Diverticulosis of large intestine without perforation or abscess without bleeding: Secondary | ICD-10-CM | POA: Diagnosis not present

## 2017-10-28 DIAGNOSIS — D126 Benign neoplasm of colon, unspecified: Secondary | ICD-10-CM | POA: Diagnosis not present

## 2017-11-03 DIAGNOSIS — D126 Benign neoplasm of colon, unspecified: Secondary | ICD-10-CM | POA: Diagnosis not present

## 2017-11-05 ENCOUNTER — Other Ambulatory Visit (HOSPITAL_COMMUNITY): Payer: Medicare Other

## 2017-11-17 ENCOUNTER — Other Ambulatory Visit: Payer: Self-pay

## 2017-11-17 ENCOUNTER — Ambulatory Visit (HOSPITAL_COMMUNITY): Payer: Medicare Other | Attending: Cardiovascular Disease

## 2017-11-17 ENCOUNTER — Other Ambulatory Visit (HOSPITAL_COMMUNITY): Payer: Medicare Other

## 2017-11-17 DIAGNOSIS — H04123 Dry eye syndrome of bilateral lacrimal glands: Secondary | ICD-10-CM | POA: Diagnosis not present

## 2017-11-17 DIAGNOSIS — I341 Nonrheumatic mitral (valve) prolapse: Secondary | ICD-10-CM | POA: Insufficient documentation

## 2017-11-17 DIAGNOSIS — I340 Nonrheumatic mitral (valve) insufficiency: Secondary | ICD-10-CM | POA: Diagnosis not present

## 2017-11-18 ENCOUNTER — Encounter: Payer: Self-pay | Admitting: Cardiology

## 2017-11-19 ENCOUNTER — Telehealth: Payer: Self-pay

## 2017-11-19 DIAGNOSIS — I340 Nonrheumatic mitral (valve) insufficiency: Secondary | ICD-10-CM

## 2017-11-19 NOTE — Telephone Encounter (Signed)
Notes recorded by Teressa Senter, RN on 11/19/2017 at 1:12 PM EDT Patient made aware of echo results. Informed pt Dr. Radford Pax recommends repeating echo in 1 year to assess progression of MR. He states understanding and thankful for the call. Echo ordered to be scheduled in 1 year   Notes recorded by Sueanne Margarita, MD on 11/18/2017 at 1:40 PM EDT Echo showed normal LV function with EF 60 to 60% with moderate LVH and increased stiffness of the heart muscle. There is evidence of mitral valve prolapse of the anterior and posterior mitral valve leaflets with mild to moderate mitral regurgitation. Please repeat 2D echocardiogram in 1 year to make sure there is been no progression of MR

## 2017-12-03 ENCOUNTER — Other Ambulatory Visit: Payer: Self-pay | Admitting: Internal Medicine

## 2017-12-03 DIAGNOSIS — R51 Headache: Secondary | ICD-10-CM | POA: Diagnosis not present

## 2017-12-03 DIAGNOSIS — J3489 Other specified disorders of nose and nasal sinuses: Secondary | ICD-10-CM | POA: Diagnosis not present

## 2017-12-03 DIAGNOSIS — H5203 Hypermetropia, bilateral: Secondary | ICD-10-CM | POA: Diagnosis not present

## 2017-12-03 DIAGNOSIS — H1789 Other corneal scars and opacities: Secondary | ICD-10-CM | POA: Diagnosis not present

## 2017-12-03 DIAGNOSIS — H11001 Unspecified pterygium of right eye: Secondary | ICD-10-CM | POA: Diagnosis not present

## 2017-12-03 DIAGNOSIS — H52203 Unspecified astigmatism, bilateral: Secondary | ICD-10-CM | POA: Diagnosis not present

## 2017-12-15 ENCOUNTER — Ambulatory Visit: Payer: Medicare Other | Admitting: Cardiology

## 2017-12-21 ENCOUNTER — Ambulatory Visit
Admission: RE | Admit: 2017-12-21 | Discharge: 2017-12-21 | Disposition: A | Discharge status: Home / Self Care | Payer: Medicare Other | Source: Ambulatory Visit | Attending: Internal Medicine | Admitting: Internal Medicine

## 2017-12-21 DIAGNOSIS — J3489 Other specified disorders of nose and nasal sinuses: Secondary | ICD-10-CM | POA: Diagnosis not present

## 2017-12-29 DIAGNOSIS — R509 Fever, unspecified: Secondary | ICD-10-CM | POA: Diagnosis not present

## 2017-12-29 DIAGNOSIS — G4489 Other headache syndrome: Secondary | ICD-10-CM | POA: Diagnosis not present

## 2017-12-29 DIAGNOSIS — R61 Generalized hyperhidrosis: Secondary | ICD-10-CM | POA: Diagnosis not present

## 2017-12-29 DIAGNOSIS — I340 Nonrheumatic mitral (valve) insufficiency: Secondary | ICD-10-CM | POA: Diagnosis not present

## 2018-01-01 ENCOUNTER — Inpatient Hospital Stay (HOSPITAL_COMMUNITY)
Admission: EM | Admit: 2018-01-01 | Discharge: 2018-01-07 | DRG: 871 | Disposition: A | Discharge status: Home Health | Payer: Medicare Other | Attending: Internal Medicine | Admitting: Internal Medicine

## 2018-01-01 ENCOUNTER — Emergency Department (HOSPITAL_COMMUNITY): Payer: Medicare Other

## 2018-01-01 ENCOUNTER — Other Ambulatory Visit: Payer: Self-pay

## 2018-01-01 ENCOUNTER — Encounter (HOSPITAL_COMMUNITY): Payer: Self-pay

## 2018-01-01 DIAGNOSIS — Z8679 Personal history of other diseases of the circulatory system: Secondary | ICD-10-CM | POA: Diagnosis not present

## 2018-01-01 DIAGNOSIS — Z882 Allergy status to sulfonamides status: Secondary | ICD-10-CM

## 2018-01-01 DIAGNOSIS — Z888 Allergy status to other drugs, medicaments and biological substances status: Secondary | ICD-10-CM | POA: Diagnosis not present

## 2018-01-01 DIAGNOSIS — I341 Nonrheumatic mitral (valve) prolapse: Secondary | ICD-10-CM | POA: Diagnosis present

## 2018-01-01 DIAGNOSIS — I511 Rupture of chordae tendineae, not elsewhere classified: Secondary | ICD-10-CM | POA: Diagnosis not present

## 2018-01-01 DIAGNOSIS — B9561 Methicillin susceptible Staphylococcus aureus infection as the cause of diseases classified elsewhere: Secondary | ICD-10-CM | POA: Diagnosis present

## 2018-01-01 DIAGNOSIS — A491 Streptococcal infection, unspecified site: Secondary | ICD-10-CM | POA: Diagnosis not present

## 2018-01-01 DIAGNOSIS — N400 Enlarged prostate without lower urinary tract symptoms: Secondary | ICD-10-CM | POA: Diagnosis not present

## 2018-01-01 DIAGNOSIS — I351 Nonrheumatic aortic (valve) insufficiency: Secondary | ICD-10-CM | POA: Diagnosis not present

## 2018-01-01 DIAGNOSIS — I340 Nonrheumatic mitral (valve) insufficiency: Secondary | ICD-10-CM | POA: Diagnosis not present

## 2018-01-01 DIAGNOSIS — E785 Hyperlipidemia, unspecified: Secondary | ICD-10-CM | POA: Diagnosis not present

## 2018-01-01 DIAGNOSIS — I10 Essential (primary) hypertension: Secondary | ICD-10-CM | POA: Diagnosis present

## 2018-01-01 DIAGNOSIS — I480 Paroxysmal atrial fibrillation: Secondary | ICD-10-CM | POA: Diagnosis not present

## 2018-01-01 DIAGNOSIS — J111 Influenza due to unidentified influenza virus with other respiratory manifestations: Secondary | ICD-10-CM | POA: Diagnosis not present

## 2018-01-01 DIAGNOSIS — R7881 Bacteremia: Principal | ICD-10-CM | POA: Diagnosis present

## 2018-01-01 DIAGNOSIS — I519 Heart disease, unspecified: Secondary | ICD-10-CM | POA: Diagnosis present

## 2018-01-01 DIAGNOSIS — R509 Fever, unspecified: Secondary | ICD-10-CM

## 2018-01-01 DIAGNOSIS — I5189 Other ill-defined heart diseases: Secondary | ICD-10-CM | POA: Diagnosis present

## 2018-01-01 DIAGNOSIS — Q430 Meckel's diverticulum (displaced) (hypertrophic): Secondary | ICD-10-CM

## 2018-01-01 DIAGNOSIS — B954 Other streptococcus as the cause of diseases classified elsewhere: Secondary | ICD-10-CM | POA: Diagnosis present

## 2018-01-01 LAB — URINALYSIS, ROUTINE W REFLEX MICROSCOPIC
Bilirubin Urine: NEGATIVE
Glucose, UA: NEGATIVE mg/dL
Hgb urine dipstick: NEGATIVE
KETONES UR: 5 mg/dL — AB
LEUKOCYTES UA: NEGATIVE
NITRITE: NEGATIVE
PH: 5 (ref 5.0–8.0)
PROTEIN: NEGATIVE mg/dL
Specific Gravity, Urine: 1.026 (ref 1.005–1.030)

## 2018-01-01 LAB — COMPREHENSIVE METABOLIC PANEL
ALT: 25 U/L (ref 0–44)
ANION GAP: 9 (ref 5–15)
AST: 20 U/L (ref 15–41)
Albumin: 3.7 g/dL (ref 3.5–5.0)
Alkaline Phosphatase: 56 U/L (ref 38–126)
BUN: 25 mg/dL — ABNORMAL HIGH (ref 8–23)
CHLORIDE: 107 mmol/L (ref 98–111)
CO2: 21 mmol/L — AB (ref 22–32)
Calcium: 8.6 mg/dL — ABNORMAL LOW (ref 8.9–10.3)
Creatinine, Ser: 1.16 mg/dL (ref 0.61–1.24)
GFR calc non Af Amer: >60 mL/min (ref >60)
Glucose, Bld: 102 mg/dL — ABNORMAL HIGH (ref 70–99)
Potassium: 3.8 mmol/L (ref 3.5–5.1)
SODIUM: 137 mmol/L (ref 135–145)
Total Bilirubin: 0.8 mg/dL (ref 0.3–1.2)
Total Protein: 6.6 g/dL (ref 6.5–8.1)

## 2018-01-01 LAB — PROTIME-INR
INR: 1.21
PROTHROMBIN TIME: 15.2 s (ref 11.4–15.2)

## 2018-01-01 LAB — I-STAT CG4 LACTIC ACID, ED
Lactic Acid, Venous: 0.63 mmol/L (ref 0.5–1.9)
Lactic Acid, Venous: 0.7 mmol/L (ref 0.5–1.9)

## 2018-01-01 LAB — CBC WITH DIFFERENTIAL/PLATELET
Basophils Absolute: 0 10*3/uL (ref 0.0–0.1)
Basophils Relative: 0 %
EOS ABS: 0.1 10*3/uL (ref 0.0–0.7)
Eosinophils Relative: 1 %
HCT: 40.3 % (ref 39.0–52.0)
Hemoglobin: 13.8 g/dL (ref 13.0–17.0)
LYMPHS ABS: 1.4 10*3/uL (ref 0.7–4.0)
LYMPHS PCT: 17 %
MCH: 30.7 pg (ref 26.0–34.0)
MCHC: 34.2 g/dL (ref 30.0–36.0)
MCV: 89.8 fL (ref 78.0–100.0)
Monocytes Absolute: 0.6 10*3/uL (ref 0.1–1.0)
Monocytes Relative: 7 %
NEUTROS PCT: 75 %
Neutro Abs: 6.2 10*3/uL (ref 1.7–7.7)
Platelets: 266 10*3/uL (ref 150–400)
RBC: 4.49 MIL/uL (ref 4.22–5.81)
RDW: 13 % (ref 11.5–15.5)
WBC: 8.3 10*3/uL (ref 4.0–10.5)

## 2018-01-01 MED ORDER — VANCOMYCIN HCL IN DEXTROSE 1-5 GM/200ML-% IV SOLN: 1000.0000 mg | Freq: Once | INTRAVENOUS | Status: DC

## 2018-01-01 MED ORDER — VANCOMYCIN HCL 10 G IV SOLR
2000.0000 mg | Freq: Once | INTRAVENOUS | Status: AC
  Administered 2018-01-02: 2000 mg via INTRAVENOUS
  Filled 2018-01-01: qty 2000

## 2018-01-01 MED ORDER — LACTATED RINGERS IV BOLUS
1000.0000 mL | Freq: Once | INTRAVENOUS | Status: AC
  Administered 2018-01-01: 1000 mL via INTRAVENOUS

## 2018-01-01 MED ORDER — DOXYCYCLINE HYCLATE 100 MG PO TABS
100.0000 mg | ORAL_TABLET | Freq: Once | ORAL | Status: AC
  Administered 2018-01-02: 100 mg via ORAL
  Filled 2018-01-01: qty 1

## 2018-01-01 MED ORDER — PIPERACILLIN-TAZOBACTAM 3.375 G IVPB 30 MIN
3.3750 g | Freq: Once | INTRAVENOUS | Status: AC
  Administered 2018-01-01: 3.375 g via INTRAVENOUS
  Filled 2018-01-01: qty 50

## 2018-01-01 MED ORDER — ACETAMINOPHEN 325 MG PO TABS
650.0000 mg | ORAL_TABLET | Freq: Once | ORAL | Status: AC | PRN
  Administered 2018-01-01: 650 mg via ORAL
  Filled 2018-01-01: qty 2

## 2018-01-01 NOTE — ED Triage Notes (Addendum)
Pt reports experiencing generalized flu-like sx x 2 weeks. Saw PCP about a week ago and had blood cultures drawn. Pt received a call today stating blood cultures were positive, to be seen in ED. Denies suspected source of infection.

## 2018-01-01 NOTE — ED Provider Notes (Signed)
[facility] DEPT Provider Note   CSN: [id number] Arrival date & time: 01/01/18  1955     History   Chief Complaint Chief Complaint  Patient presents with  . Blood Infection  . Fever    HPI Chase Smith. is a 72 y.o. male.  72yo M w/ PMH including A fib, HTN, MV prolapse who p/w fever and positive blood culture. About 10 days ago, he began having body aches, fevers, night sweats. Occasional headache but never severe or sustained. He has chronic sinus pressure that has not changed recently. After ~1 week of fever symptoms, he saw his PCP where he had blood work drawn. He was called back today to come to ER due to gram + cocci in the blood.  No cough, rash, urinary symptoms, vomiting, or diarrhea. No recent travel or sick contacts. He had tick bite ~1 month ago.   The history is provided by the patient.  Fever      Past Medical History:  Diagnosis Date  . Abdominal mass, RLQ (right lower quadrant) 07/27/2012  . Abdominal pain   . Actinic keratoses   . Allergic rhinitis   . Benign essential HTN 02/07/2015  . BPH (benign prostatic hyperplasia)    mild  . Dilated aortic root ([place])    49mm by echo 01/2017  . ED (erectile dysfunction) 08/2011   cancelled f/u with Urology  . Epididymitis    recurrent  . Folliculitis   . Generalized headaches    slight, dull  . Hypertension    in the past - resolved  . Left ventricular diastolic dysfunction, NYHA class 2   . Meckel's diverticulitis 08/27/2012  . Moderate mitral regurgitation 08/08/2013  . MVP (mitral valve prolapse)    Bileaflet MVP with mild to moderate MR by echo 10/2017  . PAF (paroxysmal atrial fibrillation) ([place]) 05/13/2017   CHADS2VASC score is 2 (HTN and age > 69) on Apixaban  . Seborrheic keratosis   . Sigmoid diverticulosis 08/1999   noted on flexible sigmoidoscopy   . Uvulitis 11/2000   acute - responded to augmentin  . Vertigo    episodic mild positional    Patient Active  Problem List   Diagnosis Date Noted  . Bacteremia 01/01/2018  . PAF (paroxysmal atrial fibrillation) ([place]) 05/13/2017  . Benign essential HTN 02/07/2015  . MVP (mitral valve prolapse)   . Moderate mitral regurgitation 08/08/2013  . Dilated aortic root ([place])   . Left ventricular diastolic dysfunction, NYHA class 2   . Meckel's diverticulitis 08/27/2012  . Abdominal mass, RLQ (right lower quadrant) 07/27/2012    Past Surgical History:  Procedure Laterality Date  . LAPAROSCOPIC APPENDECTOMY N/A 08/11/2012   Procedure: APPENDECTOMY LAPAROSCOPIC;  Surgeon: Stark Klein, MD;  Location: [facility] ORS;  Service: General;  Laterality: N/A;  . LAPAROSCOPIC SMALL BOWEL RESECTION N/A 08/11/2012   Procedure: Diagnostic Laparoscopy,  Diverticulectomy and laparoscopic appendectomy;  Surgeon: Stark Klein, MD;  Location: [facility] ORS;  Service: General;  Laterality: N/A;  Diagnostic Laparoscopy, Small Bowel Resection vs Diverticulectomy   . PILONIDAL CYST EXCISION    . RECONSTRUCTION OF NOSE     secondary to deviated septum  . TEE WITHOUT CARDIOVERSION N/A 08/08/2013   Procedure: TRANSESOPHAGEAL ECHOCARDIOGRAM (TEE);  Surgeon: Sueanne Margarita, MD;  Location: [facility] ENDOSCOPY;  Service: Cardiovascular;  Laterality: N/A;  . TONSILLECTOMY          Home Medications    Prior to Admission medications   Medication Sig Start Date  End Date Taking? Authorizing Provider  carvedilol (COREG) 3.125 MG tablet Take 3.125 mg by mouth 2 (two) times daily. 04/27/17  Yes [provider]  Cholecalciferol (VITAMIN D) 2000 UNITS tablet Take 2,000 Units daily by mouth.    Yes [provider]  Coenzyme Q10 (CO Q-10 PO) Take 1 capsule by mouth daily.   Yes [provider]  Cyanocobalamin (VITAMIN B12 PO) Take 500 mg by mouth daily.   Yes [provider]  ELIQUIS 5 MG TABS tablet Take 5 mg by mouth 2 (two) times daily. 04/27/17  Yes [provider]  flunisolide (NASALIDE) 25 MCG/ACT (0.025%) SOLN  Place 2 sprays into the nose 2 (two) times daily as needed.   Yes [provider]  losartan (COZAAR) 50 MG tablet Take 50 mg by mouth daily.   Yes [provider]  rosuvastatin (CRESTOR) 5 MG tablet Take 5 mg by mouth 3 (three) times a week.  03/10/17  Yes [provider]    Family History Family History  Problem Relation Age of Onset  . Cancer Mother        breast  . Cancer Father        pancreatic  . Heart failure Father   . Cancer Maternal Aunt        ? ovarian  . Cancer Maternal Uncle        ? pancreatic  . Cancer Paternal Uncle        ? lung    Social History Social History   Tobacco Use  . Smoking status: Never Smoker  . Smokeless tobacco: Never Used  Substance Use Topics  . Alcohol use: Yes    Comment: one drink per day (wine or beer)  . Drug use: No     Allergies   Sulfa antibiotics; Ace inhibitors; and Antihistamines, chlorpheniramine-type   Review of Systems Review of Systems  Constitutional: Positive for fever.  All other systems reviewed and are negative except that which was mentioned in HPI    Physical Exam Updated Vital Signs BP 108/73 (BP Location: Right Arm)   Pulse 62   Temp 99.7 F (37.6 C) (Oral)   Resp 18   Ht 6' (1.829 m)   Wt 93.9 kg (207 lb)   SpO2 96%   BMI 28.07 kg/m   Physical Exam  Constitutional: He is oriented to person, place, and time. He appears well-developed and well-nourished. No distress.  HENT:  Head: Normocephalic and atraumatic.  Mouth/Throat: Oropharynx is clear and moist.  Moist mucous membranes  Eyes: Conjunctivae are normal.  Neck: Neck supple.  Cardiovascular: Normal rate, regular rhythm and normal heart sounds.  No murmur heard. Pulmonary/Chest: Effort normal and breath sounds normal.  Abdominal: Soft. Bowel sounds are normal. He exhibits no distension. There is no tenderness.  Musculoskeletal: He exhibits no edema.  Neurological: He is alert and oriented to person, place,  and time.  Fluent speech  Skin: Skin is warm and dry. No rash noted.  Psychiatric: He has a normal mood and affect. Judgment normal.  Nursing note and vitals reviewed.    ED Treatments / Results  Labs (all labs ordered are listed, but only abnormal results are displayed) Labs Reviewed  COMPREHENSIVE METABOLIC PANEL - Abnormal; Notable for the following components:      Result Value   CO2 21 (*)    Glucose, Bld 102 (*)    BUN 25 (*)    Calcium 8.6 (*)    All other components within  normal limits  URINALYSIS, ROUTINE W REFLEX MICROSCOPIC - Abnormal; Notable for the following components:   Ketones, ur 5 (*)    All other components within normal limits  CULTURE, BLOOD (ROUTINE X 2)  CULTURE, BLOOD (ROUTINE X 2)  URINE CULTURE  CBC WITH DIFFERENTIAL/PLATELET  PROTIME-INR  I-STAT CG4 LACTIC ACID, ED  I-STAT CG4 LACTIC ACID, ED    EKG EKG Interpretation  Date/Time:  Friday January 01 2018 22:47:36 EDT Ventricular Rate:  61 PR Interval:    QRS Duration: 94 QT Interval:  436 QTC Calculation: 440 R Axis:   41 Text Interpretation:  Sinus rhythm Atrial premature complex No previous ECGs available Confirmed by Theotis Burrow [phone]) on 01/01/2018 11:05:21 PM   Radiology Dg Chest 2 View  Result Date: 01/01/2018 CLINICAL DATA:  Generalized flu-like symptoms for 2 weeks. History of hypertension. Nonsmoker. EXAM: CHEST - 2 VIEW COMPARISON:  08/03/2012 FINDINGS: The heart size and mediastinal contours are within normal limits. Both lungs are clear. The visualized skeletal structures are unremarkable. IMPRESSION: No active cardiopulmonary disease. Electronically Signed   By: Lucienne Capers M.D.   On: 01/01/2018 23:30    Procedures .Critical Care Performed by: Sharlett Iles, MD Authorized by: Sharlett Iles, MD   Critical care provider statement:    Critical care time (minutes):  30   Critical care time was exclusive of:  Separately billable procedures and treating  other patients   Critical care was necessary to treat or prevent imminent or life-threatening deterioration of the following conditions:  Sepsis   Critical care was time spent personally by me on the following activities:  Development of treatment plan with patient or surrogate, evaluation of patient's response to treatment, examination of patient, obtaining history from patient or surrogate, ordering and performing treatments and interventions, ordering and review of laboratory studies, ordering and review of radiographic studies and re-evaluation of patient's condition   (including critical care time)  Medications Ordered in ED Medications  vancomycin (VANCOCIN) 2,000 mg in sodium chloride 0.9 % 500 mL IVPB (has no administration in time range)  doxycycline (VIBRA-TABS) tablet 100 mg (has no administration in time range)  acetaminophen (TYLENOL) tablet 650 mg (650 mg Oral Given 01/01/18 2027)  piperacillin-tazobactam (ZOSYN) IVPB 3.375 g (3.375 g Intravenous New Bag/Given 01/01/18 2308)  lactated ringers bolus 1,000 mL (1,000 mLs Intravenous New Bag/Given 01/01/18 2308)     Initial Impression / Assessment and Plan / ED Course  I have reviewed the triage vital signs and the nursing notes.  Pertinent labs & imaging results that were available during my care of the patient were reviewed by me and considered in my medical decision making (see chart for details).    He was well-appearing on exam, febrile on arrival but the remainder vital signs stable.  He had no specific complaints.  Based on vital sign abnormalities and known positive blood cultures, and initiated a code sepsis with blood and urine cultures, broad-spectrum antibiotics, and IV fluids.  Also gave Tylenol.   Lactate normal.  Reassuring CMP, CBC normal.  UA without evidence of infection.  Chest x-ray clear.  Based on his well appearance, no meningismus, no severe headache I doubt meningitis and do not feel he needs LP at this time. DDx  does include tick-borne illness. Covered w/ doxycycline. RMSF negative at PCP but ehrlichiosis is also possibility.  History of mitral valve prolapse also raises concern for possible endocarditis.  Discussed admission with [facility] hospitalist, Dr. Hal Hope, and patient admitted for  further care.  Final Clinical Impressions(s) / ED Diagnoses   Final diagnoses:  None    ED Discharge Orders    None       Trashawn Oquendo, Wenda Overland, MD 01/02/18 [phone]

## 2018-01-01 NOTE — Progress Notes (Signed)
A consult was received from an ED physician for Vancomycin and Zosyn per pharmacy dosing.  The patient's profile has been reviewed for ht/wt/allergies/indication/available labs.   A one time order has been placed for Vancomycin 2gm iv x1,and  Zosyn 3.375gm iv x1.  Further antibiotics/pharmacy consults should be ordered by admitting physician if indicated.                       Thank you, [name] 01/01/2018  10:53 PM

## 2018-01-02 ENCOUNTER — Inpatient Hospital Stay (HOSPITAL_COMMUNITY): Payer: Medicare Other

## 2018-01-02 ENCOUNTER — Encounter (HOSPITAL_COMMUNITY): Payer: Self-pay | Admitting: Internal Medicine

## 2018-01-02 DIAGNOSIS — I351 Nonrheumatic aortic (valve) insufficiency: Secondary | ICD-10-CM

## 2018-01-02 LAB — BLOOD CULTURE ID PANEL (REFLEXED)
ACINETOBACTER BAUMANNII: NOT DETECTED
CANDIDA ALBICANS: NOT DETECTED
CANDIDA GLABRATA: NOT DETECTED
Candida krusei: NOT DETECTED
Candida parapsilosis: NOT DETECTED
Candida tropicalis: NOT DETECTED
ENTEROBACTER CLOACAE COMPLEX: NOT DETECTED
ENTEROBACTERIACEAE SPECIES: NOT DETECTED
ENTEROCOCCUS SPECIES: NOT DETECTED
Escherichia coli: NOT DETECTED
HAEMOPHILUS INFLUENZAE: NOT DETECTED
Klebsiella oxytoca: NOT DETECTED
Klebsiella pneumoniae: NOT DETECTED
Listeria monocytogenes: NOT DETECTED
NEISSERIA MENINGITIDIS: NOT DETECTED
PSEUDOMONAS AERUGINOSA: NOT DETECTED
Proteus species: NOT DETECTED
STREPTOCOCCUS AGALACTIAE: NOT DETECTED
STREPTOCOCCUS PNEUMONIAE: NOT DETECTED
STREPTOCOCCUS PYOGENES: NOT DETECTED
STREPTOCOCCUS SPECIES: DETECTED — AB
Serratia marcescens: NOT DETECTED
Staphylococcus aureus (BCID): NOT DETECTED
Staphylococcus species: NOT DETECTED

## 2018-01-02 LAB — HEPATIC FUNCTION PANEL
ALT: 21 U/L (ref 0–44)
AST: 18 U/L (ref 15–41)
Albumin: 3.1 g/dL — ABNORMAL LOW (ref 3.5–5.0)
Alkaline Phosphatase: 47 U/L (ref 38–126)
BILIRUBIN INDIRECT: 0.8 mg/dL (ref 0.3–0.9)
Bilirubin, Direct: 0.2 mg/dL (ref 0.0–0.2)
TOTAL PROTEIN: 5.7 g/dL — AB (ref 6.5–8.1)
Total Bilirubin: 1 mg/dL (ref 0.3–1.2)

## 2018-01-02 LAB — CBC
HCT: 37 % — ABNORMAL LOW (ref 39.0–52.0)
Hemoglobin: 12.5 g/dL — ABNORMAL LOW (ref 13.0–17.0)
MCH: 30.5 pg (ref 26.0–34.0)
MCHC: 33.8 g/dL (ref 30.0–36.0)
MCV: 90.2 fL (ref 78.0–100.0)
PLATELETS: 207 10*3/uL (ref 150–400)
RBC: 4.1 MIL/uL — ABNORMAL LOW (ref 4.22–5.81)
RDW: 13.1 % (ref 11.5–15.5)
WBC: 6.5 10*3/uL (ref 4.0–10.5)

## 2018-01-02 LAB — BASIC METABOLIC PANEL
Anion gap: 7 (ref 5–15)
BUN: 21 mg/dL (ref 8–23)
CALCIUM: 8.1 mg/dL — AB (ref 8.9–10.3)
CO2: 25 mmol/L (ref 22–32)
CREATININE: 0.99 mg/dL (ref 0.61–1.24)
Chloride: 109 mmol/L (ref 98–111)
GFR calc non Af Amer: >60 mL/min (ref >60)
Glucose, Bld: 97 mg/dL (ref 70–99)
Potassium: 3.6 mmol/L (ref 3.5–5.1)
SODIUM: 141 mmol/L (ref 135–145)

## 2018-01-02 LAB — HIV ANTIBODY (ROUTINE TESTING W REFLEX): HIV SCREEN 4TH GENERATION: NONREACTIVE

## 2018-01-02 LAB — SEDIMENTATION RATE: Sed Rate: 14 mm/hr (ref 0–16)

## 2018-01-02 LAB — ECHOCARDIOGRAM COMPLETE
HEIGHTINCHES: 72 in
WEIGHTICAEL: 3280.44 [oz_av]

## 2018-01-02 MED ORDER — LOSARTAN POTASSIUM 50 MG PO TABS: 50.0000 mg | ORAL_TABLET | Freq: Every day | ORAL | Status: DC

## 2018-01-02 MED ORDER — ONDANSETRON HCL 4 MG PO TABS: 4.0000 mg | ORAL_TABLET | Freq: Four times a day (QID) | ORAL | Status: DC | PRN

## 2018-01-02 MED ORDER — CARVEDILOL 3.125 MG PO TABS
3.1250 mg | ORAL_TABLET | Freq: Two times a day (BID) | ORAL | Status: DC
  Administered 2018-01-02 – 2018-01-07 (×11): 3.125 mg via ORAL
  Filled 2018-01-02 (×11): qty 1

## 2018-01-02 MED ORDER — ACETAMINOPHEN 325 MG PO TABS
650.0000 mg | ORAL_TABLET | Freq: Four times a day (QID) | ORAL | Status: DC | PRN
  Administered 2018-01-02: 650 mg via ORAL
  Filled 2018-01-02: qty 2

## 2018-01-02 MED ORDER — SODIUM CHLORIDE 0.9 % IV SOLN
100.0000 mg | Freq: Two times a day (BID) | INTRAVENOUS | Status: DC
  Administered 2018-01-02: 100 mg via INTRAVENOUS
  Filled 2018-01-02 (×2): qty 100

## 2018-01-02 MED ORDER — APIXABAN 5 MG PO TABS
5.0000 mg | ORAL_TABLET | Freq: Two times a day (BID) | ORAL | Status: DC
  Administered 2018-01-02 – 2018-01-07 (×10): 5 mg via ORAL
  Filled 2018-01-02 (×10): qty 1

## 2018-01-02 MED ORDER — ACETAMINOPHEN 650 MG RE SUPP: 650.0000 mg | Freq: Four times a day (QID) | RECTAL | Status: DC | PRN

## 2018-01-02 MED ORDER — LOSARTAN POTASSIUM 50 MG PO TABS
50.0000 mg | ORAL_TABLET | Freq: Every day | ORAL | Status: DC
  Administered 2018-01-02 – 2018-01-06 (×5): 50 mg via ORAL
  Filled 2018-01-02 (×5): qty 1

## 2018-01-02 MED ORDER — CEFAZOLIN SODIUM-DEXTROSE 2-4 GM/100ML-% IV SOLN
2.0000 g | Freq: Three times a day (TID) | INTRAVENOUS | Status: DC
  Administered 2018-01-02 – 2018-01-04 (×6): 2 g via INTRAVENOUS
  Filled 2018-01-02 (×7): qty 100

## 2018-01-02 MED ORDER — VANCOMYCIN HCL 10 G IV SOLR: 1750.0000 mg | INTRAVENOUS | Status: DC

## 2018-01-02 MED ORDER — ENSURE ENLIVE PO LIQD
237.0000 mL | Freq: Two times a day (BID) | ORAL | Status: DC
  Administered 2018-01-04 – 2018-01-06 (×2): 237 mL via ORAL

## 2018-01-02 MED ORDER — SODIUM CHLORIDE 0.9 % IV SOLN
INTRAVENOUS | Status: DC | PRN
  Administered 2018-01-02: 500 mL via INTRAVENOUS

## 2018-01-02 MED ORDER — ROSUVASTATIN CALCIUM 5 MG PO TABS
5.0000 mg | ORAL_TABLET | ORAL | Status: DC
  Administered 2018-01-04 – 2018-01-06 (×2): 5 mg via ORAL
  Filled 2018-01-02 (×3): qty 1

## 2018-01-02 MED ORDER — ONDANSETRON HCL 4 MG/2ML IJ SOLN: 4.0000 mg | Freq: Four times a day (QID) | INTRAMUSCULAR | Status: DC | PRN

## 2018-01-02 NOTE — Progress Notes (Signed)
  PROGRESS NOTE  Patient admitted earlier this morning. See H&P. Chase Smith. is a 72 y.o. male with history of mitral valve prolapse, hypertension, atrial fibrillation has been experiencing subjective feeling of fever and chills over the last 10 days.  Patient states that he noticed a tick on him a month ago.  Over the last 10 days he has been having subjective feeling of fever, chills, flulike symptoms with mild headache.  Three days ago he went to his primary care physician and had blood work done and was told yesterday evening that his blood cultures grew gram-positive cocci in 4 out of 4 bottles.  He was instructed to come to the ER.  Patient states that his [place] spotted fever titers were negative.  Denies any rash over the body of any joint swelling.  Patient seen and examined this morning, patient without any acute complaints.  He had a fever last night 101.3 Fahrenheit.  He some night sweats overnight.  Denies any cough, chest pain, nausea, vomiting, diarrhea, dysuria.  He has no skin wounds, rash over his body.  I spoke with Dr. Felipa Eth, on call for [facility] PCP. I also spoke with [facility] (phone# [phone] and account [phone]) and asked them to send over prelim blood culture records to [facility] (fax# [phone]). I discussed with ID Dr. Baxter Flattery for consultation for strep bacteremia.   Blood cultures drawn on 7/5 negative < 12 hours TTE pending  Continue vanco  ?Doxy  ID consulted    Chase Phi, DO [facility] [email or web address] Password [email or web address] 01/02/2018, 10:35 AM

## 2018-01-02 NOTE — Progress Notes (Signed)
Pharmacy Antibiotic Note  Chase Smith. is a 72 y.o. male admitted on 01/01/2018 with bacteremia.  Pharmacy has been consulted for Vancomycin dosing.  Plan: Vancomycin 2gm iv x1, then 1750mg  iv q24hr  Height: 6' (182.9 cm) Weight: 205 lb 0.4 oz (93 kg) IBW/kg (Calculated) : 77.6  Temp (24hrs), Avg:99.5 F (37.5 C), Min:98.4 F (36.9 C), Max:101.3 F (38.5 C)  Recent Labs  Lab 01/01/18 2028 01/01/18 2034 01/01/18 2318 01/02/18 0408  WBC 8.3  --   --  6.5  CREATININE 1.16  --   --  0.99  LATICACIDVEN  --  0.63 0.70  --     Estimated Creatinine Clearance: 76.2 mL/min (by C-G formula based on SCr of 0.99 mg/dL).    Allergies  Allergen Reactions  . Sulfa Antibiotics Other (See Comments)    Severe lethargy  . Ace Inhibitors Cough  . Antihistamines, Chlorpheniramine-Type Other (See Comments)    Trouble urinating. Patient st "all antihistamines" cause this.     Antimicrobials this admission: Vancomycin 01/01/2018 >> Zosyn 01/01/2018 x1  Dose adjustments this admission: -  Microbiology results: -  Thank you for allowing pharmacy to be a part of this patient's care.  Chase Smith 01/02/2018 6:54 AM

## 2018-01-02 NOTE — Consult Note (Signed)
[facility]  Total days of antibiotics 2        Day 2 vanco/doxy               Reason for Consult: gram positive bacteremia/FUO    Referring Physician: choi  Active Problems:   MVP (mitral valve prolapse)   Benign essential HTN   PAF (paroxysmal atrial fibrillation) (HCC)   Bacteremia    HPI: Chase Smith. is a 72 y.o. male with hx of afib, mitral valve prolapse who started to experience fever, chills x 10days was seen by his PCP, Dr Inda Merlin who started FUO work up. " I felt like I had the flu without the cough". His blood cx were positive for gram positive cocci in chains and clusters, thus sent to ED for further management. In the ED, he was found to be febrile at Temp of 101F.   Patient states that he noticed a tick on him 1-2 months ago.    Patient states that his [place] spotted fever titers were negative.  Denies any rash over the body of any joint swelling.  I spoke with [facility] reports that his blood cx were drawn on 7/2 at 11:39am- prelim- streptococcal mutans in 1 set. And 2nd set showing streptococcal mutans.   Had recent gi illness, had colonoscopy in may with removal of 5 benign polyps, had dental cleaning in march, and eye surgery in late winter/early spring.   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 Unclear if there is a vegetation on the atrial side of anterior leaflet tip vs focal thickening - which maybe a new finding.  Past Medical History:  Diagnosis Date  . Abdominal mass, RLQ (right lower quadrant) 07/27/2012  . Abdominal pain   . Actinic keratoses   . Allergic rhinitis   . Benign essential HTN 02/07/2015  . BPH (benign prostatic hyperplasia)    mild  . Dilated aortic root ([place])    50m by echo 01/2017  . ED (erectile dysfunction) 08/2011   cancelled f/u with  Urology  . Epididymitis    recurrent  . Folliculitis   . Generalized headaches    slight, dull  . Hypertension    in the past - resolved  . Left ventricular diastolic dysfunction, NYHA class 2   . Meckel's diverticulitis 08/27/2012  . Moderate mitral regurgitation 08/08/2013  . MVP (mitral valve prolapse)    Bileaflet MVP with mild to moderate MR by echo 10/2017  . PAF (paroxysmal atrial fibrillation) ([place] 05/13/2017   CHADS2VASC score is 2 (HTN and age > 67 on Apixaban  . Seborrheic keratosis   . Sigmoid diverticulosis 08/1999   noted on flexible sigmoidoscopy   . Uvulitis 11/2000   acute - responded to augmentin  . Vertigo    episodic mild positional    Allergies:  Allergies  Allergen Reactions  . Sulfa Antibiotics Other (See Comments)    Severe lethargy  . Ace Inhibitors Cough  . Antihistamines, Chlorpheniramine-Type Other (See Comments)    Trouble urinating. Patient st "all antihistamines" cause this.      MEDICATIONS: . apixaban  5 mg Oral BID  . carvedilol  3.125 mg Oral BID  . feeding supplement (ENSURE ENLIVE)  237 mL Oral BID BM  . losartan  50 mg Oral Daily  . [START ON 01/04/2018] rosuvastatin  5 mg Oral Once per day on Mon Wed Fri    Social History   Tobacco Use  . Smoking status: Never Smoker  . Smokeless tobacco: Never Used  Substance Use Topics  . Alcohol use: Yes    Comment: one drink per day (wine or beer)  . Drug use: No    Family History  Problem Relation Age of Onset  . Cancer Mother        breast  . Cancer Father        pancreatic  . Heart failure Father   . Cancer Maternal Aunt        ? ovarian  . Cancer Maternal Uncle        ? pancreatic  . Cancer Paternal Uncle        ? lung    Review of Systems  Constitutional: positive for fever, chills, diaphoresis, activity change, appetite change, fatigue and unexpected weight change.  HENT: Negative for congestion, sore throat, rhinorrhea, sneezing, trouble swallowing and sinus pressure.    Eyes: Negative for photophobia and visual disturbance.  Respiratory: Negative for cough, chest tightness, shortness of breath, wheezing and stridor.  Cardiovascular: Negative for chest pain, palpitations and leg swelling.  Gastrointestinal: Negative for nausea, vomiting, abdominal pain, diarrhea, constipation, blood in stool, abdominal distention and anal bleeding.  Genitourinary: Negative for dysuria, hematuria, flank pain and difficulty urinating.  Musculoskeletal: Negative for myalgias, back pain, joint swelling, arthralgias and gait problem.  Skin: Negative for color change, pallor, rash and wound.  Neurological: Negative for dizziness, tremors, weakness and light-headedness.  Hematological: Negative for adenopathy. Does not bruise/bleed easily.  Psychiatric/Behavioral: Negative for behavioral problems, confusion, sleep disturbance, dysphoric mood, decreased concentration and agitation.    OBJECTIVE: Temp:  [98.4 F (36.9 C)-101.3 F (38.5 C)] 98.4 F (36.9 C) (07/06 0645) Pulse Rate:  [58-80] 60 (07/06 0645) Resp:  [16-20] 18 (07/06 0645) BP: (108-137)/(64-101) 126/64 (07/06 0645) SpO2:  [96 %-100 %] 100 % (07/06 0645) Weight:  [205 lb 0.4 oz (93 kg)-207 lb (93.9 kg)] 205 lb 0.4 oz (93 kg) (07/06 0059) Physical Exam  Constitutional: He is oriented to person, place, and time. He appears well-developed and well-nourished. No distress.  HENT:  Mouth/Throat: Oropharynx is clear and moist. No oropharyngeal exudate.  Cardiovascular: Normal rate, regular rhythm and normal heart sounds.+murmur Pulmonary/Chest: Effort normal and breath sounds normal. No respiratory distress. He has no wheezes.  Abdominal: Soft. Bowel sounds are normal. He exhibits no distension. There is no tenderness.  Lymphadenopathy:  He has no cervical adenopathy.  Neurological: He is alert and oriented to person, place, and time.  Skin: Skin is warm and dry. Questionable splinter hemorrhage on the left  hand. Psychiatric: He has a normal mood and affect. His behavior is normal.     LABS: Results for orders placed or performed during the hospital encounter of 01/01/18 (from the past 48 hour(s))  Comprehensive metabolic panel     Status: Abnormal   Collection Time: 01/01/18  8:28 PM  Result Value Ref Range   Sodium 137 135 - 145 mmol/L   Potassium 3.8 3.5 - 5.1 mmol/L   Chloride 107 98 - 111 mmol/L    Comment: Please note change in reference range.   CO2 21 (L) 22 - 32 mmol/L   Glucose, Bld 102 (H) 70 - 99 mg/dL    Comment: Please note change in reference range.   BUN 25 (H) 8 - 23 mg/dL    Comment: Please note change in  reference range.   Creatinine, Ser 1.16 0.61 - 1.24 mg/dL   Calcium 8.6 (L) 8.9 - 10.3 mg/dL   Total Protein 6.6 6.5 - 8.1 g/dL   Albumin 3.7 3.5 - 5.0 g/dL   AST 20 15 - 41 U/L   ALT 25 0 - 44 U/L    Comment: Please note change in reference range.   Alkaline Phosphatase 56 38 - 126 U/L   Total Bilirubin 0.8 0.3 - 1.2 mg/dL   GFR calc non Af Amer >60 >60 mL/min   GFR calc Af Amer >60 >60 mL/min    Comment: (NOTE) The eGFR has been calculated using the CKD EPI equation. This calculation has not been validated in all clinical situations. eGFR's persistently <60 mL/min signify possible Chronic Kidney Disease.    Anion gap 9 5 - 15    Comment: Performed at [facility], [place] [address] [place], [place] [address]  CBC with Differential     Status: None   Collection Time: 01/01/18  8:28 PM  Result Value Ref Range   WBC 8.3 4.0 - 10.5 K/uL   RBC 4.49 4.22 - 5.81 MIL/uL   Hemoglobin 13.8 13.0 - 17.0 g/dL   HCT 40.3 39.0 - 52.0 %   MCV 89.8 78.0 - 100.0 fL   MCH 30.7 26.0 - 34.0 pg   MCHC 34.2 30.0 - 36.0 g/dL   RDW 13.0 11.5 - 15.5 %   Platelets 266 150 - 400 K/uL   Neutrophils Relative % 75 %   Neutro Abs 6.2 1.7 - 7.7 K/uL   Lymphocytes Relative 17 %   Lymphs Abs 1.4 0.7 - 4.0 K/uL   Monocytes Relative 7 %   Monocytes Absolute 0.6  0.1 - 1.0 K/uL   Eosinophils Relative 1 %   Eosinophils Absolute 0.1 0.0 - 0.7 K/uL   Basophils Relative 0 %   Basophils Absolute 0.0 0.0 - 0.1 K/uL    Comment: Performed at [facility], [place] [address], [place], [place] [address]  Protime-INR     Status: None   Collection Time: 01/01/18  8:28 PM  Result Value Ref Range   Prothrombin Time 15.2 11.4 - 15.2 seconds   INR 1.21     Comment: Performed at [facility], [place] [address] [place], [place] [address]  Culture, blood (Routine x 2)     Status: None (Preliminary result)   Collection Time: 01/01/18  8:29 PM  Result Value Ref Range   Specimen Description      BLOOD LEFT ANTECUBITAL Performed at [facility] [address] [place], [place] [address]    Special Requests      BOTTLES DRAWN AEROBIC AND ANAEROBIC Blood Culture adequate volume Performed at [facility] [address], [place], [place] [address]    Culture      NO GROWTH < 12 HOURS Performed at [facility] [address], [place], [place] [address]    Report Status PENDING   Culture, blood (Routine x 2)     Status: None (Preliminary result)   Collection Time: 01/01/18  8:30 PM  Result Value Ref Range   Specimen Description      BLOOD RIGHT ANTECUBITAL Performed at [facility] [address] [place], [place] [address]    Special Requests      BOTTLES DRAWN AEROBIC AND ANAEROBIC Blood Culture adequate volume Performed at [facility] [address]  [place], [place] [address]    Culture      NO GROWTH < 12 HOURS Performed at [facility] [address], [place], [place] [address]    Report Status PENDING   I-Stat CG4 Lactic Acid, ED     Status: None   Collection Time: 01/01/18  8:34 PM  Result Value Ref Range   Lactic Acid, Venous 0.63 0.5 - 1.9 mmol/L  Urinalysis, Routine w reflex microscopic     Status: Abnormal   Collection  Time: 01/01/18 10:43 PM  Result Value Ref Range   Color, Urine YELLOW YELLOW   APPearance CLEAR CLEAR   Specific Gravity, Urine 1.026 1.005 - 1.030   pH 5.0 5.0 - 8.0   Glucose, UA NEGATIVE NEGATIVE mg/dL   Hgb urine dipstick NEGATIVE NEGATIVE   Bilirubin Urine NEGATIVE NEGATIVE   Ketones, ur 5 (A) NEGATIVE mg/dL   Protein, ur NEGATIVE NEGATIVE mg/dL   Nitrite NEGATIVE NEGATIVE   Leukocytes, UA NEGATIVE NEGATIVE    Comment: Performed at [facility], [place] [address], [place], [place] [address]  I-Stat CG4 Lactic Acid, ED     Status: None   Collection Time: 01/01/18 11:18 PM  Result Value Ref Range   Lactic Acid, Venous 0.70 0.5 - 1.9 mmol/L  Basic metabolic panel     Status: Abnormal   Collection Time: 01/02/18  4:08 AM  Result Value Ref Range   Sodium 141 135 - 145 mmol/L   Potassium 3.6 3.5 - 5.1 mmol/L   Chloride 109 98 - 111 mmol/L    Comment: Please note change in reference range.   CO2 25 22 - 32 mmol/L   Glucose, Bld 97 70 - 99 mg/dL    Comment: Please note change in reference range.   BUN 21 8 - 23 mg/dL    Comment: Please note change in reference range.   Creatinine, Ser 0.99 0.61 - 1.24 mg/dL   Calcium 8.1 (L) 8.9 - 10.3 mg/dL   GFR calc non Af Amer >60 >60 mL/min   GFR calc Af Amer >60 >60 mL/min    Comment: (NOTE) The eGFR has been calculated using the CKD EPI equation. This calculation has not been validated in all clinical situations. eGFR's persistently <60 mL/min signify possible Chronic Kidney Disease.    Anion gap 7 5 - 15    Comment: Performed at [facility], [place] [address] [place], [place] [address]  CBC     Status: Abnormal   Collection Time: 01/02/18  4:08 AM  Result Value Ref Range   WBC 6.5 4.0 - 10.5 K/uL   RBC 4.10 (L) 4.22 - 5.81 MIL/uL   Hemoglobin 12.5 (L) 13.0 - 17.0 g/dL   HCT 37.0 (L) 39.0 - 52.0 %   MCV 90.2 78.0 - 100.0 fL   MCH 30.5 26.0 - 34.0 pg   MCHC 33.8 30.0 - 36.0 g/dL   RDW 13.1 11.5  - 15.5 %   Platelets 207 150 - 400 K/uL    Comment: Performed at [facility], [place] [address] [place], [place] [address]  Hepatic function panel     Status: Abnormal   Collection Time: 01/02/18  4:08 AM  Result Value Ref Range   Total Protein 5.7 (L) 6.5 - 8.1 g/dL   Albumin 3.1 (L) 3.5 - 5.0 g/dL   AST 18 15 - 41 U/L   ALT 21 0 - 44 U/L    Comment: Please note change in  reference range.   Alkaline Phosphatase 47 38 - 126 U/L   Total Bilirubin 1.0 0.3 - 1.2 mg/dL   Bilirubin, Direct 0.2 0.0 - 0.2 mg/dL    Comment: Please note change in reference range.   Indirect Bilirubin 0.8 0.3 - 0.9 mg/dL    Comment: Performed at [facility], [place] [address], [place], [place] [address]  Sedimentation rate     Status: None   Collection Time: 01/02/18  4:08 AM  Result Value Ref Range   Sed Rate 14 0 - 16 mm/hr    Comment: Performed at [facility], [place] [address] [place], [place] [address]    MICRO: 7/5 pending IMAGING: Dg Chest 2 View  Result Date: 01/01/2018 CLINICAL DATA:  Generalized flu-like symptoms for 2 weeks. History of hypertension. Nonsmoker. EXAM: CHEST - 2 VIEW COMPARISON:  08/03/2012 FINDINGS: The heart size and mediastinal contours are within normal limits. Both lungs are clear. The visualized skeletal structures are unremarkable. IMPRESSION: No active cardiopulmonary disease. Electronically Signed   By: Lucienne Capers M.D.   On: 01/01/2018 23:30   Assessment/Plan:  72yo M with FUO x 10days found to have streptococcal mutans bacteremia  - recommend to continue with cefazolin 2gm iv q 8hr - will discontinue vancomycin - please get TEE to evaluate for endocarditis - can D/c doxycycline.  - await sensitivities to see if can treat with PCN - repeat blood cx on Sunday night. - will need minimum of 2 wk but possibly longer if + endocarditis.

## 2018-01-02 NOTE — ED Notes (Signed)
ED TO INPATIENT HANDOFF REPORT  Name/Age/Gender Chase Smith. 72 y.o. male  Code Status    Code Status Orders  (From admission, onward)        Start     Ordered   01/02/18 0022  Full code  Continuous     01/02/18 0022    Code Status History    This patient has a current code status but no historical code status.      Home/SNF/Other Home  Chief Complaint work up   Level of Care/Admitting Diagnosis ED Disposition    ED Disposition Condition [facility]: [facility] [[id number]]  Level of Care: Med-Surg [16]  Diagnosis: Bacteremia [790.7.ICD-9-CM]  Admitting Physician: Rise Patience [[id number]]  Attending Physician: Rise Patience [phone]  Estimated length of stay: past midnight tomorrow  Certification:: I certify this patient will need inpatient services for at least 2 midnights  PT Class (Do Not Modify): Inpatient [101]  PT Acc Code (Do Not Modify): Private [1]       Medical History Past Medical History:  Diagnosis Date  . Abdominal mass, RLQ (right lower quadrant) 07/27/2012  . Abdominal pain   . Actinic keratoses   . Allergic rhinitis   . Benign essential HTN 02/07/2015  . BPH (benign prostatic hyperplasia)    mild  . Dilated aortic root ([place])    40m by echo 01/2017  . ED (erectile dysfunction) 08/2011   cancelled f/u with Urology  . Epididymitis    recurrent  . Folliculitis   . Generalized headaches    slight, dull  . Hypertension    in the past - resolved  . Left ventricular diastolic dysfunction, NYHA class 2   . Meckel's diverticulitis 08/27/2012  . Moderate mitral regurgitation 08/08/2013  . MVP (mitral valve prolapse)    Bileaflet MVP with mild to moderate MR by echo 10/2017  . PAF (paroxysmal atrial fibrillation) ([place] 05/13/2017   CHADS2VASC score is 2 (HTN and age > 666 on Apixaban  . Seborrheic keratosis   . Sigmoid diverticulosis 08/1999   noted on flexible sigmoidoscopy   . Uvulitis 11/2000    acute - responded to augmentin  . Vertigo    episodic mild positional    Allergies Allergies  Allergen Reactions  . Sulfa Antibiotics Other (See Comments)    Severe lethargy  . Ace Inhibitors Cough  . Antihistamines, Chlorpheniramine-Type Other (See Comments)    Trouble urinating. Patient st "all antihistamines" cause this.     IV Location/Drains/Wounds Patient Lines/Drains/Airways Status   Active Line/Drains/Airways    Name:   Placement date:   Placement time:   Site:   Days:   Peripheral IV 01/01/18 Left Forearm   01/01/18    2308    Forearm   1   Incision 08/11/12 Abdomen   08/11/12    1335     1970   Incision - 3 Ports Abdomen Lower;Medial Umbilicus Left;Medial   096/75/91   1321     1970          Labs/Imaging Results for orders placed or performed during the hospital encounter of 01/01/18 (from the past 48 hour(s))  Comprehensive metabolic panel     Status: Abnormal   Collection Time: 01/01/18  8:28 PM  Result Value Ref Range   Sodium 137 135 - 145 mmol/L   Potassium 3.8 3.5 - 5.1 mmol/L   Chloride 107 98 - 111 mmol/L    Comment: Please  note change in reference range.   CO2 21 (L) 22 - 32 mmol/L   Glucose, Bld 102 (H) 70 - 99 mg/dL    Comment: Please note change in reference range.   BUN 25 (H) 8 - 23 mg/dL    Comment: Please note change in reference range.   Creatinine, Ser 1.16 0.61 - 1.24 mg/dL   Calcium 8.6 (L) 8.9 - 10.3 mg/dL   Total Protein 6.6 6.5 - 8.1 g/dL   Albumin 3.7 3.5 - 5.0 g/dL   AST 20 15 - 41 U/L   ALT 25 0 - 44 U/L    Comment: Please note change in reference range.   Alkaline Phosphatase 56 38 - 126 U/L   Total Bilirubin 0.8 0.3 - 1.2 mg/dL   GFR calc non Af Amer >60 >60 mL/min   GFR calc Af Amer >60 >60 mL/min    Comment: (NOTE) The eGFR has been calculated using the CKD EPI equation. This calculation has not been validated in all clinical situations. eGFR's persistently <60 mL/min signify possible Chronic Kidney Disease.    Anion  gap 9 5 - 15    Comment: Performed at [facility], [place] [address], [place], [place] [address]  CBC with Differential     Status: None   Collection Time: 01/01/18  8:28 PM  Result Value Ref Range   WBC 8.3 4.0 - 10.5 K/uL   RBC 4.49 4.22 - 5.81 MIL/uL   Hemoglobin 13.8 13.0 - 17.0 g/dL   HCT 40.3 39.0 - 52.0 %   MCV 89.8 78.0 - 100.0 fL   MCH 30.7 26.0 - 34.0 pg   MCHC 34.2 30.0 - 36.0 g/dL   RDW 13.0 11.5 - 15.5 %   Platelets 266 150 - 400 K/uL   Neutrophils Relative % 75 %   Neutro Abs 6.2 1.7 - 7.7 K/uL   Lymphocytes Relative 17 %   Lymphs Abs 1.4 0.7 - 4.0 K/uL   Monocytes Relative 7 %   Monocytes Absolute 0.6 0.1 - 1.0 K/uL   Eosinophils Relative 1 %   Eosinophils Absolute 0.1 0.0 - 0.7 K/uL   Basophils Relative 0 %   Basophils Absolute 0.0 0.0 - 0.1 K/uL    Comment: Performed at [facility], [place] [address] [place], [place] [address]  Protime-INR     Status: None   Collection Time: 01/01/18  8:28 PM  Result Value Ref Range   Prothrombin Time 15.2 11.4 - 15.2 seconds   INR 1.21     Comment: Performed at [facility], [place] [address] [place], [place] [address]  I-Stat CG4 Lactic Acid, ED     Status: None   Collection Time: 01/01/18  8:34 PM  Result Value Ref Range   Lactic Acid, Venous 0.63 0.5 - 1.9 mmol/L  Urinalysis, Routine w reflex microscopic     Status: Abnormal   Collection Time: 01/01/18 10:43 PM  Result Value Ref Range   Color, Urine YELLOW YELLOW   APPearance CLEAR CLEAR   Specific Gravity, Urine 1.026 1.005 - 1.030   pH 5.0 5.0 - 8.0   Glucose, UA NEGATIVE NEGATIVE mg/dL   Hgb urine dipstick NEGATIVE NEGATIVE   Bilirubin Urine NEGATIVE NEGATIVE   Ketones, ur 5 (A) NEGATIVE mg/dL   Protein, ur NEGATIVE NEGATIVE mg/dL   Nitrite NEGATIVE NEGATIVE   Leukocytes, UA NEGATIVE NEGATIVE    Comment: Performed at [facility] [address] [place], [place]  [address]  I-Stat CG4  Lactic Acid, ED     Status: None   Collection Time: 01/01/18 11:18 PM  Result Value Ref Range   Lactic Acid, Venous 0.70 0.5 - 1.9 mmol/L   Dg Chest 2 View  Result Date: 01/01/2018 CLINICAL DATA:  Generalized flu-like symptoms for 2 weeks. History of hypertension. Nonsmoker. EXAM: CHEST - 2 VIEW COMPARISON:  08/03/2012 FINDINGS: The heart size and mediastinal contours are within normal limits. Both lungs are clear. The visualized skeletal structures are unremarkable. IMPRESSION: No active cardiopulmonary disease. Electronically Signed   By: Lucienne Capers M.D.   On: 01/01/2018 23:30    Pending Labs Unresulted Labs (From admission, onward)   Start     Ordered   01/02/18 0500  HIV antibody (Routine Testing)  Tomorrow morning,   R     01/02/18 0022   01/02/18 5502  Basic metabolic panel  Tomorrow morning,   R     01/02/18 0022   01/02/18 0500  CBC  Tomorrow morning,   R     01/02/18 0022   01/02/18 0500  Hepatic function panel  Tomorrow morning,   R     01/02/18 0022   01/02/18 0500  Sedimentation rate  Tomorrow morning,   R     01/02/18 0022   01/01/18 2240  Urine culture  STAT,   STAT    Question:  Patient immune status  Answer:  Normal   01/01/18 2240   01/01/18 2015  Culture, blood (Routine x 2)  BLOOD CULTURE X 2,   STAT     01/01/18 2015      Vitals/Pain Today's Vitals   01/01/18 2000 01/01/18 2013 01/01/18 2230 01/02/18 0032  BP: (!) 137/101  108/73 (!) 117/94  Pulse: 80  62 (!) 59  Resp: _0 Temp: (!) 101.3 F (38.5 C)  99.7 F (37.6 C)   TempSrc: Oral  Oral   SpO2: 98%  96% 97%  Weight: 207 lb (93.9 kg)     Height:  6' (1.829 m)      Isolation Precautions No active isolations  Medications Medications  vancomycin (VANCOCIN) 2,000 mg in sodium chloride 0.9 % 500 mL IVPB (has no administration in time range)  carvedilol (COREG) tablet 3.125 mg (has no administration in time range)  apixaban (ELIQUIS) tablet 5 mg (has no administration in time range)   losartan (COZAAR) tablet 50 mg (has no administration in time range)  rosuvastatin (CRESTOR) tablet 5 mg (has no administration in time range)  acetaminophen (TYLENOL) tablet 650 mg (has no administration in time range)    Or  acetaminophen (TYLENOL) suppository 650 mg (has no administration in time range)  ondansetron (ZOFRAN) tablet 4 mg (has no administration in time range)    Or  ondansetron (ZOFRAN) injection 4 mg (has no administration in time range)  acetaminophen (TYLENOL) tablet 650 mg (650 mg Oral Given 01/01/18 2027)  piperacillin-tazobactam (ZOSYN) IVPB 3.375 g (0 g Intravenous Stopped 01/01/18 2338)  lactated ringers bolus 1,000 mL (1,000 mLs Intravenous New Bag/Given 01/01/18 2308)  doxycycline (VIBRA-TABS) tablet 100 mg (100 mg Oral Given 01/02/18 0008)    Mobility walks

## 2018-01-02 NOTE — H&P (Signed)
History and Physical    Chase Smith. WNI:[id number] DOB: 1946-05-16 DOA: 01/01/2018  PCP: Josetta Huddle, MD  Patient coming from: Home.  Chief Complaint: Positive blood cultures.  HPI: Chase Smith. is a 72 y.o. male with history of mitral valve prolapse, hypertension, atrial fibrillation has been experiencing subjective feeling of fever and chills over the last 10 days.  Patient states that he noticed a tick on him a month ago.  Over the last 10 days he has been having subjective feeling of fever chills flulike symptoms with mild headache.  Denies any chest pain shortness of breath productive cough nausea vomiting abdominal pain or diarrhea.  3 days ago he went to his primary care physician and had blood work done and was told yesterday evening that his blood cultures grew gram-positive cocci in 4 out of 4 bottles.  And was instructed to come to the ER.  Patient states that his [place] spotted fever titers were negative.  Denies any rash over the body of any joint swelling.  ED Course: In the ER repeat blood cultures were obtained and patient was started on vancomycin and Zosyn and doxycycline.  On exam patient is not in distress.  Patient was febrile with temperatures around 101 F.  Labs did not show any thrombocytopenia.  Chest x-ray and UA were unremarkable.  No neck rigidity.  Review of Systems: As per HPI, rest all negative.   Past Medical History:  Diagnosis Date  . Abdominal mass, RLQ (right lower quadrant) 07/27/2012  . Abdominal pain   . Actinic keratoses   . Allergic rhinitis   . Benign essential HTN 02/07/2015  . BPH (benign prostatic hyperplasia)    mild  . Dilated aortic root ([place])    61mm by echo 01/2017  . ED (erectile dysfunction) 08/2011   cancelled f/u with Urology  . Epididymitis    recurrent  . Folliculitis   . Generalized headaches    slight, dull  . Hypertension    in the past - resolved  . Left ventricular diastolic dysfunction, NYHA class 2    . Meckel's diverticulitis 08/27/2012  . Moderate mitral regurgitation 08/08/2013  . MVP (mitral valve prolapse)    Bileaflet MVP with mild to moderate MR by echo 10/2017  . PAF (paroxysmal atrial fibrillation) ([place]) 05/13/2017   CHADS2VASC score is 2 (HTN and age > 59) on Apixaban  . Seborrheic keratosis   . Sigmoid diverticulosis 08/1999   noted on flexible sigmoidoscopy   . Uvulitis 11/2000   acute - responded to augmentin  . Vertigo    episodic mild positional    Past Surgical History:  Procedure Laterality Date  . LAPAROSCOPIC APPENDECTOMY N/A 08/11/2012   Procedure: APPENDECTOMY LAPAROSCOPIC;  Surgeon: Stark Klein, MD;  Location: [facility];  Service: General;  Laterality: N/A;  . LAPAROSCOPIC SMALL BOWEL RESECTION N/A 08/11/2012   Procedure: Diagnostic Laparoscopy,  Diverticulectomy and laparoscopic appendectomy;  Surgeon: Stark Klein, MD;  Location: [facility];  Service: General;  Laterality: N/A;  Diagnostic Laparoscopy, Small Bowel Resection vs Diverticulectomy   . PILONIDAL CYST EXCISION    . RECONSTRUCTION OF NOSE     secondary to deviated septum  . TEE WITHOUT CARDIOVERSION N/A 08/08/2013   Procedure: TRANSESOPHAGEAL ECHOCARDIOGRAM (TEE);  Surgeon: Sueanne Margarita, MD;  Location: [facility] ENDOSCOPY;  Service: Cardiovascular;  Laterality: N/A;  . TONSILLECTOMY       reports that he has never smoked. He has never used smokeless tobacco. He reports  that he drinks alcohol. He reports that he does not use drugs.  Allergies  Allergen Reactions  . Sulfa Antibiotics Other (See Comments)    Severe lethargy  . Ace Inhibitors Cough  . Antihistamines, Chlorpheniramine-Type Other (See Comments)    Trouble urinating. Patient st "all antihistamines" cause this.     Family History  Problem Relation Age of Onset  . Cancer Mother        breast  . Cancer Father        pancreatic  . Heart failure Father   . Cancer Maternal Aunt        ? ovarian  . Cancer Maternal Uncle        ? pancreatic  .  Cancer Paternal Uncle        ? lung    Prior to Admission medications   Medication Sig Start Date End Date Taking? Authorizing Provider  carvedilol (COREG) 3.125 MG tablet Take 3.125 mg by mouth 2 (two) times daily. 04/27/17  Yes [provider]  Cholecalciferol (VITAMIN D) 2000 UNITS tablet Take 2,000 Units daily by mouth.    Yes [provider]  Coenzyme Q10 (CO Q-10 PO) Take 1 capsule by mouth daily.   Yes [provider]  Cyanocobalamin (VITAMIN B12 PO) Take 500 mg by mouth daily.   Yes [provider]  ELIQUIS 5 MG TABS tablet Take 5 mg by mouth 2 (two) times daily. 04/27/17  Yes [provider]  flunisolide (NASALIDE) 25 MCG/ACT (0.025%) SOLN Place 2 sprays into the nose 2 (two) times daily as needed.   Yes [provider]  losartan (COZAAR) 50 MG tablet Take 50 mg by mouth daily.   Yes [provider]  rosuvastatin (CRESTOR) 5 MG tablet Take 5 mg by mouth 3 (three) times a week.  03/10/17  Yes [provider]    Physical Exam: Vitals:   01/01/18 2000 01/01/18 2013 01/01/18 2230  BP: (!) 137/101  108/73  Pulse: 80  62  Resp: 20  18  Temp: (!) 101.3 F (38.5 C)  99.7 F (37.6 C)  TempSrc: Oral  Oral  SpO2: 98%  96%  Weight: 93.9 kg (207 lb)    Height:  6' (1.829 m)       Constitutional: Moderately built and nourished. Vitals:   01/01/18 2000 01/01/18 2013 01/01/18 2230  BP: (!) 137/101  108/73  Pulse: 80  62  Resp: 20  18  Temp: (!) 101.3 F (38.5 C)  99.7 F (37.6 C)  TempSrc: Oral  Oral  SpO2: 98%  96%  Weight: 93.9 kg (207 lb)    Height:  6' (1.829 m)    Eyes: Anicteric no pallor. ENMT: No discharge from the ears eyes nose or mouth. Neck: No neck rigidity no mass felt. Respiratory: No rhonchi or crepitations. Cardiovascular: S1-S2 heard no murmurs appreciated. Abdomen: Soft nontender bowel sounds present. Musculoskeletal: No edema.  No joint effusion. Skin: No rash.  Skin appears  warm. Neurologic: Alert awake oriented to time place and person.  Moves all extremities. Psychiatric: Appears normal.  Normal affect.   Labs on Admission: I have personally reviewed following labs and imaging studies  CBC: Recent Labs  Lab 01/01/18 2028  WBC 8.3  NEUTROABS 6.2  HGB 13.8  HCT 40.3  MCV 89.8  PLT 740   Basic Metabolic Panel: Recent Labs  Lab 01/01/18 2028  NA 137  K 3.8  CL 107  CO2 21*  GLUCOSE 102*  BUN 25*  CREATININE 1.16  CALCIUM 8.6*   GFR: Estimated Creatinine Clearance: 70.5 mL/min (by C-G formula based on SCr of 1.16 mg/dL). Liver Function Tests: Recent Labs  Lab 01/01/18 2028  AST 20  ALT 25  ALKPHOS 56  BILITOT 0.8  PROT 6.6  ALBUMIN 3.7   No results for input(s): LIPASE, AMYLASE in the last 168 hours. No results for input(s): AMMONIA in the last 168 hours. Coagulation Profile: Recent Labs  Lab 01/01/18 2028  INR 1.21   Cardiac Enzymes: No results for input(s): CKTOTAL, CKMB, CKMBINDEX, TROPONINI in the last 168 hours. BNP (last 3 results) No results for input(s): PROBNP in the last 8760 hours. HbA1C: No results for input(s): HGBA1C in the last 72 hours. CBG: No results for input(s): GLUCAP in the last 168 hours. Lipid Profile: No results for input(s): CHOL, HDL, LDLCALC, TRIG, CHOLHDL, LDLDIRECT in the last 72 hours. Thyroid Function Tests: No results for input(s): TSH, T4TOTAL, FREET4, T3FREE, THYROIDAB in the last 72 hours. Anemia Panel: No results for input(s): VITAMINB12, FOLATE, FERRITIN, TIBC, IRON, RETICCTPCT in the last 72 hours. Urine analysis:    Component Value Date/Time   COLORURINE YELLOW 01/01/2018 2243   APPEARANCEUR CLEAR 01/01/2018 2243   LABSPEC 1.026 01/01/2018 2243   PHURINE 5.0 01/01/2018 2243   GLUCOSEU NEGATIVE 01/01/2018 2243   HGBUR NEGATIVE 01/01/2018 2243   BILIRUBINUR NEGATIVE 01/01/2018 2243   KETONESUR 5 (A) 01/01/2018 2243   PROTEINUR NEGATIVE 01/01/2018 2243   UROBILINOGEN 1.0  07/10/2012 1636   NITRITE NEGATIVE 01/01/2018 2243   LEUKOCYTESUR NEGATIVE 01/01/2018 2243   Sepsis Labs: @LABRCNTIP (procalcitonin:4,lacticidven:4) )No results found for this or any previous visit (from the past 240 hour(s)).   Radiological Exams on Admission: Dg Chest 2 View  Result Date: 01/01/2018 CLINICAL DATA:  Generalized flu-like symptoms for 2 weeks. History of hypertension. Nonsmoker. EXAM: CHEST - 2 VIEW COMPARISON:  08/03/2012 FINDINGS: The heart size and mediastinal contours are within normal limits. Both lungs are clear. The visualized skeletal structures are unremarkable. IMPRESSION: No active cardiopulmonary disease. Electronically Signed   By: Lucienne Capers M.D.   On: 01/01/2018 23:30      Assessment/Plan Active Problems:   MVP (mitral valve prolapse)   Benign essential HTN   PAF (paroxysmal atrial fibrillation) (HCC)   Bacteremia    1. Gram-positive bacteremia -positive blood cultures done as outpatient.  Will need to get further final results from primary care physician.  Patient will be kept on vancomycin and since patient also had some tick exposure we will recheck RMSF titer Lyme titer will continue on doxycycline.  Consult infectious disease in a.m.  Check 2D echo. 2. History of paroxysmal atrial fibrillation on beta-blockers and apixaban which should be continued.  Chads 2 vasc score is 2. 3. Hypertension on Cozaar and and Coreg.  4. History of mitral valve prolapse follow 2D echo.   DVT prophylaxis: Apixaban. Code Status: Full code. Family Communication: Discussed with patient. Disposition Plan: Home. Consults called: None. Admission status: Inpatient.   Rise Patience MD [facility] Pager [phone].  If 7PM-7AM, please contact night-coverage [email or web address] Password [facility]  01/02/2018, 12:23 AM

## 2018-01-02 NOTE — Progress Notes (Signed)
  Echocardiogram 2D Echocardiogram has been performed.  Chase Smith [name] 01/02/2018, 12:16 PM

## 2018-01-02 NOTE — Progress Notes (Signed)
[facility] Infusion Coordinator will follow pt with ID team to support home infusion Pharmacy services for home IV ABX at DC. [facility] will work with hospital case Freight forwarder and work with partner [facility] agency in [place] area.   If patient discharges after hours, please call [phone].   Larry Sierras 01/02/2018, 8:14 PM

## 2018-01-03 LAB — CBC
HEMATOCRIT: 40.4 % (ref 39.0–52.0)
HEMOGLOBIN: 13.6 g/dL (ref 13.0–17.0)
MCH: 30.6 pg (ref 26.0–34.0)
MCHC: 33.7 g/dL (ref 30.0–36.0)
MCV: 91 fL (ref 78.0–100.0)
Platelets: 258 10*3/uL (ref 150–400)
RBC: 4.44 MIL/uL (ref 4.22–5.81)
RDW: 13.2 % (ref 11.5–15.5)
WBC: 6.2 10*3/uL (ref 4.0–10.5)

## 2018-01-03 LAB — BASIC METABOLIC PANEL
ANION GAP: 7 (ref 5–15)
BUN: 14 mg/dL (ref 8–23)
CALCIUM: 8.5 mg/dL — AB (ref 8.9–10.3)
CHLORIDE: 108 mmol/L (ref 98–111)
CO2: 25 mmol/L (ref 22–32)
Creatinine, Ser: 0.95 mg/dL (ref 0.61–1.24)
GFR calc Af Amer: >60 mL/min (ref >60)
GFR calc non Af Amer: >60 mL/min (ref >60)
GLUCOSE: 95 mg/dL (ref 70–99)
Potassium: 4 mmol/L (ref 3.5–5.1)
Sodium: 140 mmol/L (ref 135–145)

## 2018-01-03 LAB — URINE CULTURE
Culture: NO GROWTH
Special Requests: NORMAL

## 2018-01-03 NOTE — Progress Notes (Signed)
PROGRESS NOTE    Chase Smith.  HER:[id number] DOB: April 28, 1946 DOA: 01/01/2018 PCP: Josetta Huddle, MD     Brief Narrative:  Chase Smith. is a 72 y.o.malewithhistory of mitral valve prolapse, hypertension, atrial fibrillation has been experiencing subjective feeling of fever and chills over the last 10 days. Patient states that he noticed a tick on him a month ago. Over the last 10 days he has been having subjective feeling of fever, chills, flulike symptoms with mild headache. Three days ago he went to his primary care physician and had blood work done and was told yesterday evening that his blood cultures grew gram-positive cocci in 4 out of 4 bottles. He was instructed to come to the ER. Patient states that his [place] spotted fever titers were negative. Denies any rash over the body of any joint swelling. He was admitted for MSSA bacteremia.   New events last 24 hours / Subjective: Feeling slightly worse this morning, had more night sweats. No other complaints.   Assessment & Plan:   Principal Problem:   Bacteremia Active Problems:   MVP (mitral valve prolapse)   Benign essential HTN   PAF (paroxysmal atrial fibrillation) (HCC)  Strep mutans bacteremia -Echo revealed mildly thickened leaflets at the tips, unclear if vegetation on atrial side of anterior leaflet tip vs focal thickening  -I have asked cardiology to schedule for TEE -Repeat blood cultures 7/5 showing gram positive cocci in chains  -Will order for repeat blood cultures again 7/7 PM -Cefazolin IV  -ID following  Paroxysmal A fib -Continue coreg, eliquis   HTN -Continue cozaar  HLD -Continue crestor   DVT prophylaxis: Eliquis Code Status: Full Family Communication: No family at bedside Disposition Plan: Pending further work up, bacteremia clearance   Consultants:   ID  Cardiology for TEE   Procedures:   None   Antimicrobials:  Anti-infectives (From admission, onward)   Start     Dose/Rate Route Frequency Ordered Stop   01/02/18 2300  vancomycin (VANCOCIN) 1,750 mg in sodium chloride 0.9 % 500 mL IVPB  Status:  Discontinued     1,750 mg 250 mL/hr over 120 Minutes Intravenous Every 24 hours 01/02/18 0641 01/02/18 1535   01/02/18 1600  ceFAZolin (ANCEF) IVPB 2g/100 mL premix     2 g 200 mL/hr over 30 Minutes Intravenous Every 8 hours 01/02/18 1536     01/02/18 0800  doxycycline (VIBRAMYCIN) 100 mg in sodium chloride 0.9 % 250 mL IVPB  Status:  Discontinued     100 mg 125 mL/hr over 120 Minutes Intravenous Every 12 hours 01/02/18 0603 01/02/18 1535   01/01/18 2300  vancomycin (VANCOCIN) 2,000 mg in sodium chloride 0.9 % 500 mL IVPB     2,000 mg 250 mL/hr over 120 Minutes Intravenous  Once 01/01/18 2252 01/02/18 0351   01/01/18 2300  doxycycline (VIBRA-TABS) tablet 100 mg     100 mg Oral  Once 01/01/18 2258 01/02/18 0008   01/01/18 2245  piperacillin-tazobactam (ZOSYN) IVPB 3.375 g     3.375 g 100 mL/hr over 30 Minutes Intravenous  Once 01/01/18 2240 01/01/18 2338   01/01/18 2245  vancomycin (VANCOCIN) IVPB 1000 mg/200 mL premix  Status:  Discontinued     1,000 mg 200 mL/hr over 60 Minutes Intravenous  Once 01/01/18 2240 01/01/18 2251       Objective: Vitals:   01/02/18 0645 01/02/18 1408 01/02/18 2103 01/03/18 0532  BP: 126/64 119/69 125/83 119/85  Pulse: 60 80 61 (!)  56  Resp: 18 16 17 18   Temp: 98.4 F (36.9 C) 98.5 F (36.9 C) 98.9 F (37.2 C) 98.7 F (37.1 C)  TempSrc: Oral Oral Oral Oral  SpO2: 100% 99% 96% 98%  Weight:      Height:        Intake/Output Summary (Last 24 hours) at 01/03/2018 0848 Last data filed at 01/02/2018 1414 Gross per 24 hour  Intake 960 ml  Output 200 ml  Net 760 ml   Filed Weights   01/01/18 2000 01/02/18 0059  Weight: 93.9 kg (207 lb) 93 kg (205 lb 0.4 oz)    Examination:  General exam: Appears calm and comfortable  Respiratory system: Clear to auscultation. Respiratory effort normal. Cardiovascular  system: S1 & S2 heard, RRR. No JVD, murmurs, rubs, gallops or clicks. No pedal edema. Gastrointestinal system: Abdomen is nondistended, soft and nontender. No organomegaly or masses felt. Normal bowel sounds heard. Central nervous system: Alert and oriented. No focal neurological deficits. Extremities: Symmetric 5 x 5 power. Skin: No rashes, lesions or ulcers Psychiatry: Judgement and insight appear normal. Mood & affect appropriate.   Data Reviewed: I have personally reviewed following labs and imaging studies  CBC: Recent Labs  Lab 01/01/18 2028 01/02/18 0408 01/03/18 0437  WBC 8.3 6.5 6.2  NEUTROABS 6.2  --   --   HGB 13.8 12.5* 13.6  HCT 40.3 37.0* 40.4  MCV 89.8 90.2 91.0  PLT 266 207 878   Basic Metabolic Panel: Recent Labs  Lab 01/01/18 2028 01/02/18 0408 01/03/18 0437  NA 137 141 140  K 3.8 3.6 4.0  CL 107 109 108  CO2 21* 25 25  GLUCOSE 102* 97 95  BUN 25* 21 14  CREATININE 1.16 0.99 0.95  CALCIUM 8.6* 8.1* 8.5*   GFR: Estimated Creatinine Clearance: 79.4 mL/min (by C-G formula based on SCr of 0.95 mg/dL). Liver Function Tests: Recent Labs  Lab 01/01/18 2028 01/02/18 0408  AST 20 18  ALT 25 21  ALKPHOS 56 47  BILITOT 0.8 1.0  PROT 6.6 5.7*  ALBUMIN 3.7 3.1*   No results for input(s): LIPASE, AMYLASE in the last 168 hours. No results for input(s): AMMONIA in the last 168 hours. Coagulation Profile: Recent Labs  Lab 01/01/18 2028  INR 1.21   Cardiac Enzymes: No results for input(s): CKTOTAL, CKMB, CKMBINDEX, TROPONINI in the last 168 hours. BNP (last 3 results) No results for input(s): PROBNP in the last 8760 hours. HbA1C: No results for input(s): HGBA1C in the last 72 hours. CBG: No results for input(s): GLUCAP in the last 168 hours. Lipid Profile: No results for input(s): CHOL, HDL, LDLCALC, TRIG, CHOLHDL, LDLDIRECT in the last 72 hours. Thyroid Function Tests: No results for input(s): TSH, T4TOTAL, FREET4, T3FREE, THYROIDAB in the last  72 hours. Anemia Panel: No results for input(s): VITAMINB12, FOLATE, FERRITIN, TIBC, IRON, RETICCTPCT in the last 72 hours. Sepsis Labs: Recent Labs  Lab 01/01/18 2034 01/01/18 2318  LATICACIDVEN 0.63 0.70    Recent Results (from the past 240 hour(s))  Culture, blood (Routine x 2)     Status: None (Preliminary result)   Collection Time: 01/01/18  8:29 PM  Result Value Ref Range Status   Specimen Description   Final    BLOOD LEFT ANTECUBITAL Performed at [place] [address], [place], [place] [address]    Special Requests   Final    BOTTLES DRAWN AEROBIC AND ANAEROBIC Blood Culture adequate volume Performed at [facility],  [place] [address], [place], [place] [address]    Culture  Setup Time   Final    IN BOTH AEROBIC AND ANAEROBIC BOTTLES GRAM POSITIVE COCCI IN CHAINS CRITICAL VALUE NOTED.  VALUE IS CONSISTENT WITH PREVIOUSLY REPORTED AND CALLED VALUE.    Culture   Final    NO GROWTH < 12 HOURS Performed at [facility], [place] [address], [place], [place] [address]    Report Status PENDING  Incomplete  Culture, blood (Routine x 2)     Status: None (Preliminary result)   Collection Time: 01/01/18  8:30 PM  Result Value Ref Range Status   Specimen Description   Final    BLOOD RIGHT ANTECUBITAL Performed at [place] [address] [place], [place] [address]    Special Requests   Final    BOTTLES DRAWN AEROBIC AND ANAEROBIC Blood Culture adequate volume Performed at [place] [address] [place], [place] [address]    Culture  Setup Time   Final    GRAM POSITIVE COCCI IN CHAINS IN BOTH AEROBIC AND ANAEROBIC BOTTLES CRITICAL RESULT CALLED TO, READ BACK BY AND VERIFIED WITH: J.GRIMSLEY,PHARMD 0130 01/03/18 M.CAMPBELL Performed at [facility], [place] [address] [place], [place] [address]    Culture GRAM POSITIVE COCCI  Final   Report Status PENDING  Incomplete  Blood  Culture ID Panel (Reflexed)     Status: Abnormal   Collection Time: 01/01/18  8:30 PM  Result Value Ref Range Status   Enterococcus species NOT DETECTED NOT DETECTED Final   Listeria monocytogenes NOT DETECTED NOT DETECTED Final   Staphylococcus species NOT DETECTED NOT DETECTED Final   Staphylococcus aureus NOT DETECTED NOT DETECTED Final   Streptococcus species DETECTED (A) NOT DETECTED Final    Comment: Not Enterococcus species, Streptococcus agalactiae, Streptococcus pyogenes, or Streptococcus pneumoniae. CRITICAL RESULT CALLED TO, READ BACK BY AND VERIFIED WITH: J.GRIMSLEY,PHARMD 01/03/18 M.CAMPBELL    Streptococcus agalactiae NOT DETECTED NOT DETECTED Final   Streptococcus pneumoniae NOT DETECTED NOT DETECTED Final   Streptococcus pyogenes NOT DETECTED NOT DETECTED Final   Acinetobacter baumannii NOT DETECTED NOT DETECTED Final   Enterobacteriaceae species NOT DETECTED NOT DETECTED Final   Enterobacter cloacae complex NOT DETECTED NOT DETECTED Final   Escherichia coli NOT DETECTED NOT DETECTED Final   Klebsiella oxytoca NOT DETECTED NOT DETECTED Final   Klebsiella pneumoniae NOT DETECTED NOT DETECTED Final   Proteus species NOT DETECTED NOT DETECTED Final   Serratia marcescens NOT DETECTED NOT DETECTED Final   Haemophilus influenzae NOT DETECTED NOT DETECTED Final   Neisseria meningitidis NOT DETECTED NOT DETECTED Final   Pseudomonas aeruginosa NOT DETECTED NOT DETECTED Final   Candida albicans NOT DETECTED NOT DETECTED Final   Candida glabrata NOT DETECTED NOT DETECTED Final   Candida krusei NOT DETECTED NOT DETECTED Final   Candida parapsilosis NOT DETECTED NOT DETECTED Final   Candida tropicalis NOT DETECTED NOT DETECTED Final    Comment: Performed at [facility], [place] [address], [place], [place] [address]       Radiology Studies: Dg Chest 2 View  Result Date: 01/01/2018 CLINICAL DATA:  Generalized flu-like symptoms for 2 weeks. History of hypertension.  Nonsmoker. EXAM: CHEST - 2 VIEW COMPARISON:  08/03/2012 FINDINGS: The heart size and mediastinal contours are within normal limits. Both lungs are clear. The visualized skeletal structures are unremarkable. IMPRESSION: No active cardiopulmonary disease. Electronically Signed   By: Lucienne Capers M.D.   On: 01/01/2018  23:30      Scheduled Meds: . apixaban  5 mg Oral BID  . carvedilol  3.125 mg Oral BID  . feeding supplement (ENSURE ENLIVE)  237 mL Oral BID BM  . losartan  50 mg Oral QHS  . [START ON 01/04/2018] rosuvastatin  5 mg Oral Once per day on Mon Wed Fri   Continuous Infusions: . sodium chloride Stopped (01/02/18 0351)  .  ceFAZolin (ANCEF) IV 2 g (01/03/18 0734)     LOS: 2 days    Time spent: 25 minutes   Dessa Phi, DO [facility] [email or web address] Password [facility] 01/03/2018, 8:48 AM

## 2018-01-03 NOTE — Progress Notes (Signed)
PHARMACY - PHYSICIAN COMMUNICATION CRITICAL VALUE ALERT - BLOOD CULTURE IDENTIFICATION (BCID)  Chase Smith. is an 72 y.o. male who presented to [facility] on 01/01/2018 with a chief complaint of Bacteremia.  Assessment:  Patient was on vancomycin and doxycycline but recently narrowed to ancef 2gm iv q8hr.  (include suspected source if known)  Name of physician (or Provider) Contacted: Dr. Georges Mouse  Current antibiotics: Ancef 2gm iv q8hr  Changes to prescribed antibiotics recommended: Suggested to wait until final culture results--ID also aware and following. Recommendations accepted by provider  Results for orders placed or performed during the hospital encounter of 01/01/18  Blood Culture ID Panel (Reflexed) (Collected: 01/01/2018  8:30 PM)  Result Value Ref Range   Enterococcus species NOT DETECTED NOT DETECTED   Listeria monocytogenes NOT DETECTED NOT DETECTED   Staphylococcus species NOT DETECTED NOT DETECTED   Staphylococcus aureus NOT DETECTED NOT DETECTED   Streptococcus species DETECTED (A) NOT DETECTED   Streptococcus agalactiae NOT DETECTED NOT DETECTED   Streptococcus pneumoniae NOT DETECTED NOT DETECTED   Streptococcus pyogenes NOT DETECTED NOT DETECTED   Acinetobacter baumannii NOT DETECTED NOT DETECTED   Enterobacteriaceae species NOT DETECTED NOT DETECTED   Enterobacter cloacae complex NOT DETECTED NOT DETECTED   Escherichia coli NOT DETECTED NOT DETECTED   Klebsiella oxytoca NOT DETECTED NOT DETECTED   Klebsiella pneumoniae NOT DETECTED NOT DETECTED   Proteus species NOT DETECTED NOT DETECTED   Serratia marcescens NOT DETECTED NOT DETECTED   Haemophilus influenzae NOT DETECTED NOT DETECTED   Neisseria meningitidis NOT DETECTED NOT DETECTED   Pseudomonas aeruginosa NOT DETECTED NOT DETECTED   Candida albicans NOT DETECTED NOT DETECTED   Candida glabrata NOT DETECTED NOT DETECTED   Candida krusei NOT DETECTED NOT DETECTED   Candida parapsilosis NOT DETECTED NOT  DETECTED   Candida tropicalis NOT DETECTED NOT DETECTED    [name] 01/03/2018  12:27 AM

## 2018-01-03 NOTE — Progress Notes (Signed)
ID PROGRESS NOTE   72yo M with 10 d hx of fever, admitted for FUO, with strep bacteremia. TTE suggests NVE.  - continue on cefazolin 2gm IV Q 8hr - please have cardiology see to do TEE-(admit blood cx + as well as outpatient blood cx) - repeat blood cx tonite or tomorrow am to document clearance of bacteremia    Micro: Description BLOOD LEFT ANTECUBITAL  Performed at [facility], [place] [address], [place], [place] [address]     Special Requests BOTTLES DRAWN AEROBIC AND ANAEROBIC Blood Culture adequate volume  Performed at [place] [address], [place],  [address]     Culture Setup Time IN BOTH AEROBIC AND ANAEROBIC BOTTLES  GRAM POSITIVE COCCI IN CHAINS  CRITICAL VALUE NOTED. VALUE IS CONSISTENT WITH PREVIOUSLY REPORTED AND CALLED VALUE.  Performed at [facility], [place] [address], [place], [place] [address]     Culture STREPTOCOCCUS MUTANSAbnormal    Report Status PENDING    Dr Johnnye Sima to see tomorrow.

## 2018-01-04 LAB — CBC
HCT: 41.2 % (ref 39.0–52.0)
Hemoglobin: 13.8 g/dL (ref 13.0–17.0)
MCH: 30.5 pg (ref 26.0–34.0)
MCHC: 33.5 g/dL (ref 30.0–36.0)
MCV: 91.2 fL (ref 78.0–100.0)
PLATELETS: 282 10*3/uL (ref 150–400)
RBC: 4.52 MIL/uL (ref 4.22–5.81)
RDW: 13.2 % (ref 11.5–15.5)
WBC: 6.4 10*3/uL (ref 4.0–10.5)

## 2018-01-04 LAB — BASIC METABOLIC PANEL
ANION GAP: 7 (ref 5–15)
BUN: 14 mg/dL (ref 8–23)
CALCIUM: 8.5 mg/dL — AB (ref 8.9–10.3)
CO2: 26 mmol/L (ref 22–32)
Chloride: 107 mmol/L (ref 98–111)
Creatinine, Ser: 0.93 mg/dL (ref 0.61–1.24)
GFR calc non Af Amer: >60 mL/min (ref >60)
GLUCOSE: 97 mg/dL (ref 70–99)
POTASSIUM: 4.3 mmol/L (ref 3.5–5.1)
SODIUM: 140 mmol/L (ref 135–145)

## 2018-01-04 LAB — CULTURE, BLOOD (ROUTINE X 2)
SPECIAL REQUESTS: ADEQUATE
Special Requests: ADEQUATE

## 2018-01-04 LAB — B. BURGDORFI ANTIBODIES: B burgdorferi Ab IgG+IgM: <0.91 {ISR} (ref 0.00–0.90)

## 2018-01-04 MED ORDER — SODIUM CHLORIDE 0.9 % IV SOLN
INTRAVENOUS | Status: DC
  Administered 2018-01-04: 21:00:00 via INTRAVENOUS

## 2018-01-04 MED ORDER — SODIUM CHLORIDE 0.9 % IV SOLN
2.0000 g | Freq: Every day | INTRAVENOUS | Status: DC
  Administered 2018-01-04 – 2018-01-07 (×4): 2 g via INTRAVENOUS
  Filled 2018-01-04 (×4): qty 2

## 2018-01-04 NOTE — Progress Notes (Signed)
Nutrition Brief Note  Patient identified on the Malnutrition Screening Tool (MST) Report  Wt Readings from Last 15 Encounters:  01/02/18 205 lb 0.4 oz (93 kg)  05/13/17 205 lb 3.2 oz (93.1 kg)  04/28/17 201 lb 1.9 oz (91.2 kg)  04/02/16 200 lb 12.8 oz (91.1 kg)  08/29/15 204 lb 6.4 oz (92.7 kg)  02/07/15 202 lb 6.4 oz (91.8 kg)  08/15/14 204 lb (92.5 kg)  07/27/13 210 lb (95.3 kg)  08/27/12 199 lb 8 oz (90.5 kg)  08/11/12 203 lb (92.1 kg)  08/03/12 203 lb 6 oz (92.3 kg)  07/27/12 202 lb 9.6 oz (91.9 kg)   Weight is stable.  Body mass index is 27.81 kg/m. Patient meets criteria for overweight based on current BMI.   Current diet order is Heart Healthy, patient is consuming approximately 75-100% of meals at this time. Family is bringing some meals. Receiving Ensure supplements. Labs and medications reviewed.   No nutrition interventions warranted at this time. If nutrition issues arise, please consult RD.   Clayton Bibles, MS, RD, [place] Dietitian Pager: [phone] After Hours Pager: [phone]

## 2018-01-04 NOTE — Progress Notes (Signed)
PROGRESS NOTE    Chase Smith.  FXT:[id number] DOB: 17-Oct-1945 DOA: 01/01/2018 PCP: Josetta Huddle, MD     Brief Narrative:  Chase Smith. is a 72 y.o.malewithhistory of mitral valve prolapse, hypertension, atrial fibrillation has been experiencing subjective feeling of fever and chills over the last 10 days. Patient states that he noticed a tick on him a month ago. Over the last 10 days he has been having subjective feeling of fever, chills, flulike symptoms with mild headache. Three days ago he went to his primary care physician and had blood work done and was told yesterday evening that his blood cultures grew gram-positive cocci in 4 out of 4 bottles. He was instructed to come to the ER. Patient states that his rocky [place] spotted fever titers were negative. Denies any rash over the body of any joint swelling. He was admitted for MSSA bacteremia.   New events last 24 hours / Subjective: No new complaints, doing well this morning.  Assessment & Plan:   Principal Problem:   Bacteremia Active Problems:   MVP (mitral valve prolapse)   Benign essential HTN   PAF (paroxysmal atrial fibrillation) (HCC)  Strep mutans bacteremia -Echo revealed mildly thickened leaflets at the tips, unclear if vegetation on atrial side of anterior leaflet tip vs focal thickening  -Repeat blood cultures 7/5 showing strep viridans -Repeat blood culture 7/7 pending -Cefazolin IV  -ID following -I have asked cardiology to schedule for TEE  Paroxysmal A fib -Continue coreg, eliquis   HTN -Continue cozaar  HLD -Continue crestor   DVT prophylaxis: Eliquis Code Status: Full Family Communication: At bedside Disposition Plan: Pending further work up, bacteremia clearance   Consultants:   ID  Cardiology for TEE   Procedures:   None   Antimicrobials:  Anti-infectives (From admission, onward)   Start     Dose/Rate Route Frequency Ordered Stop   01/02/18 2300  vancomycin  (VANCOCIN) 1,750 mg in sodium chloride 0.9 % 500 mL IVPB  Status:  Discontinued     1,750 mg 250 mL/hr over 120 Minutes Intravenous Every 24 hours 01/02/18 0641 01/02/18 1535   01/02/18 1600  ceFAZolin (ANCEF) IVPB 2g/100 mL premix     2 g 200 mL/hr over 30 Minutes Intravenous Every 8 hours 01/02/18 1536     01/02/18 0800  doxycycline (VIBRAMYCIN) 100 mg in sodium chloride 0.9 % 250 mL IVPB  Status:  Discontinued     100 mg 125 mL/hr over 120 Minutes Intravenous Every 12 hours 01/02/18 0603 01/02/18 1535   01/01/18 2300  vancomycin (VANCOCIN) 2,000 mg in sodium chloride 0.9 % 500 mL IVPB     2,000 mg 250 mL/hr over 120 Minutes Intravenous  Once 01/01/18 2252 01/02/18 0351   01/01/18 2300  doxycycline (VIBRA-TABS) tablet 100 mg     100 mg Oral  Once 01/01/18 2258 01/02/18 0008   01/01/18 2245  piperacillin-tazobactam (ZOSYN) IVPB 3.375 g     3.375 g 100 mL/hr over 30 Minutes Intravenous  Once 01/01/18 2240 01/01/18 2338   01/01/18 2245  vancomycin (VANCOCIN) IVPB 1000 mg/200 mL premix  Status:  Discontinued     1,000 mg 200 mL/hr over 60 Minutes Intravenous  Once 01/01/18 2240 01/01/18 2251       Objective: Vitals:   01/03/18 0532 01/03/18 1419 01/03/18 2207 01/04/18 0522  BP: 119/85 124/79 124/74 114/72  Pulse: (!) 56 (!) 52 (!) 51 (!) 55  Resp: 18 18 16 15   Temp: 98.7 F (37.1  C) 98.7 F (37.1 C) 98.7 F (37.1 C) 97.9 F (36.6 C)  TempSrc: Oral Oral Oral Oral  SpO2: 98% 98% 97% 99%  Weight:      Height:        Intake/Output Summary (Last 24 hours) at 01/04/2018 1102 Last data filed at 01/04/2018 0433 Gross per 24 hour  Intake 460 ml  Output -  Net 460 ml   Filed Weights   01/01/18 2000 01/02/18 0059  Weight: 93.9 kg (207 lb) 93 kg (205 lb 0.4 oz)    Examination: General exam: Appears calm and comfortable  Respiratory system: Clear to auscultation. Respiratory effort normal. Cardiovascular system: S1 & S2 heard, RRR. No JVD, murmurs, rubs, gallops or clicks. No  pedal edema. Gastrointestinal system: Abdomen is nondistended, soft and nontender. No organomegaly or masses felt. Normal bowel sounds heard. Central nervous system: Alert and oriented. No focal neurological deficits. Extremities: Symmetric 5 x 5 power. Skin: No rashes, lesions or ulcers Psychiatry: Judgement and insight appear normal. Mood & affect appropriate.    Data Reviewed: I have personally reviewed following labs and imaging studies  CBC: Recent Labs  Lab 01/01/18 2028 01/02/18 0408 01/03/18 0437 01/04/18 0511  WBC 8.3 6.5 6.2 6.4  NEUTROABS 6.2  --   --   --   HGB 13.8 12.5* 13.6 13.8  HCT 40.3 37.0* 40.4 41.2  MCV 89.8 90.2 91.0 91.2  PLT 266 207 258 161   Basic Metabolic Panel: Recent Labs  Lab 01/01/18 2028 01/02/18 0408 01/03/18 0437 01/04/18 0511  NA 137 141 140 140  K 3.8 3.6 4.0 4.3  CL 107 109 108 107  CO2 21* 25 25 26   GLUCOSE 102* 97 95 97  BUN 25* 21 14 14   CREATININE 1.16 0.99 0.95 0.93  CALCIUM 8.6* 8.1* 8.5* 8.5*   GFR: Estimated Creatinine Clearance: 81.1 mL/min (by C-G formula based on SCr of 0.93 mg/dL). Liver Function Tests: Recent Labs  Lab 01/01/18 2028 01/02/18 0408  AST 20 18  ALT 25 21  ALKPHOS 56 47  BILITOT 0.8 1.0  PROT 6.6 5.7*  ALBUMIN 3.7 3.1*   No results for input(s): LIPASE, AMYLASE in the last 168 hours. No results for input(s): AMMONIA in the last 168 hours. Coagulation Profile: Recent Labs  Lab 01/01/18 2028  INR 1.21   Cardiac Enzymes: No results for input(s): CKTOTAL, CKMB, CKMBINDEX, TROPONINI in the last 168 hours. BNP (last 3 results) No results for input(s): PROBNP in the last 8760 hours. HbA1C: No results for input(s): HGBA1C in the last 72 hours. CBG: No results for input(s): GLUCAP in the last 168 hours. Lipid Profile: No results for input(s): CHOL, HDL, LDLCALC, TRIG, CHOLHDL, LDLDIRECT in the last 72 hours. Thyroid Function Tests: No results for input(s): TSH, T4TOTAL, FREET4, T3FREE,  THYROIDAB in the last 72 hours. Anemia Panel: No results for input(s): VITAMINB12, FOLATE, FERRITIN, TIBC, IRON, RETICCTPCT in the last 72 hours. Sepsis Labs: Recent Labs  Lab 01/01/18 2034 01/01/18 2318  LATICACIDVEN 0.63 0.70    Recent Results (from the past 240 hour(s))  Culture, blood (Routine x 2)     Status: Abnormal   Collection Time: 01/01/18  8:29 PM  Result Value Ref Range Status   Specimen Description   Final    BLOOD LEFT ANTECUBITAL Performed at [facility] [address], [place], [place] [address]    Special Requests   Final    BOTTLES DRAWN AEROBIC AND ANAEROBIC Blood Culture adequate volume Performed  at [facility], [place] [address], [place], [place] [address]    Culture  Setup Time   Final    IN BOTH AEROBIC AND ANAEROBIC BOTTLES GRAM POSITIVE COCCI IN CHAINS CRITICAL VALUE NOTED.  VALUE IS CONSISTENT WITH PREVIOUSLY REPORTED AND CALLED VALUE.    Culture (A)  Final    VIRIDANS STREPTOCOCCUS SUSCEPTIBILITIES PERFORMED ON PREVIOUS CULTURE WITHIN THE LAST 5 DAYS. Performed at [facility], [place] [address] [place], [place] [address]    Report Status 01/04/2018 FINAL  Final  Culture, blood (Routine x 2)     Status: Abnormal   Collection Time: 01/01/18  8:30 PM  Result Value Ref Range Status   Specimen Description   Final    BLOOD RIGHT ANTECUBITAL Performed at [facility] [address], [place], [place] [address]    Special Requests   Final    BOTTLES DRAWN AEROBIC AND ANAEROBIC Blood Culture adequate volume Performed at [facility] [address] [place], [place] [address]    Culture  Setup Time   Final    GRAM POSITIVE COCCI IN CHAINS IN BOTH AEROBIC AND ANAEROBIC BOTTLES CRITICAL RESULT CALLED TO, READ BACK BY AND VERIFIED WITH: J.GRIMSLEY,PHARMD 0130 01/03/18 M.CAMPBELL Performed at [facility], [place] [address] [place], [place] [address]    Culture  VIRIDANS STREPTOCOCCUS (A)  Final   Report Status 01/04/2018 FINAL  Final   Organism ID, Bacteria VIRIDANS STREPTOCOCCUS  Final      Susceptibility   Viridans streptococcus - MIC*    PENICILLIN <=0.06 SENSITIVE Sensitive     CEFTRIAXONE <=0.12 SENSITIVE Sensitive     ERYTHROMYCIN <=0.12 SENSITIVE Sensitive     LEVOFLOXACIN 1 SENSITIVE Sensitive     VANCOMYCIN 1 SENSITIVE Sensitive     * VIRIDANS STREPTOCOCCUS  Blood Culture ID Panel (Reflexed)     Status: Abnormal   Collection Time: 01/01/18  8:30 PM  Result Value Ref Range Status   Enterococcus species NOT DETECTED NOT DETECTED Final   Listeria monocytogenes NOT DETECTED NOT DETECTED Final   Staphylococcus species NOT DETECTED NOT DETECTED Final   Staphylococcus aureus NOT DETECTED NOT DETECTED Final   Streptococcus species DETECTED (A) NOT DETECTED Final    Comment: Not Enterococcus species, Streptococcus agalactiae, Streptococcus pyogenes, or Streptococcus pneumoniae. CRITICAL RESULT CALLED TO, READ BACK BY AND VERIFIED WITH: J.GRIMSLEY,PHARMD 01/03/18 M.CAMPBELL    Streptococcus agalactiae NOT DETECTED NOT DETECTED Final   Streptococcus pneumoniae NOT DETECTED NOT DETECTED Final   Streptococcus pyogenes NOT DETECTED NOT DETECTED Final   Acinetobacter baumannii NOT DETECTED NOT DETECTED Final   Enterobacteriaceae species NOT DETECTED NOT DETECTED Final   Enterobacter cloacae complex NOT DETECTED NOT DETECTED Final   Escherichia coli NOT DETECTED NOT DETECTED Final   Klebsiella oxytoca NOT DETECTED NOT DETECTED Final   Klebsiella pneumoniae NOT DETECTED NOT DETECTED Final   Proteus species NOT DETECTED NOT DETECTED Final   Serratia marcescens NOT DETECTED NOT DETECTED Final   Haemophilus influenzae NOT DETECTED NOT DETECTED Final   Neisseria meningitidis NOT DETECTED NOT DETECTED Final   Pseudomonas aeruginosa NOT DETECTED NOT DETECTED Final   Candida albicans NOT DETECTED NOT DETECTED Final   Candida glabrata NOT DETECTED NOT  DETECTED Final   Candida krusei NOT DETECTED NOT DETECTED Final   Candida parapsilosis NOT DETECTED NOT DETECTED Final   Candida tropicalis NOT DETECTED NOT DETECTED Final    Comment: Performed at [facility], [place] [address],  [place], [place] [address]  Urine culture     Status: None   Collection Time: 01/01/18 10:40 PM  Result Value Ref Range Status   Specimen Description   Final    URINE, CLEAN CATCH Performed at [facility], [place] [address], [place], [place] [address]    Special Requests   Final    Normal Performed at [facility], [place] [address], [place], [place] [address]    Culture   Final    NO GROWTH Performed at [facility], [place] [address] [place], [place] [address]    Report Status 01/03/2018 FINAL  Final       Radiology Studies: No results found.    Scheduled Meds: . apixaban  5 mg Oral BID  . carvedilol  3.125 mg Oral BID  . feeding supplement (ENSURE ENLIVE)  237 mL Oral BID BM  . losartan  50 mg Oral QHS  . rosuvastatin  5 mg Oral Once per day on Mon Wed Fri   Continuous Infusions: . sodium chloride Stopped (01/02/18 0351)  .  ceFAZolin (ANCEF) IV 2 g (01/04/18 0918)     LOS: 3 days    Time spent: 25 minutes   Dessa Phi, DO [facility] [email or web address] Password [email or web address] 01/04/2018, 11:02 AM

## 2018-01-04 NOTE — Care Management Important Message (Signed)
Important Message  Patient Details  Name: Chase Smith. MRN: [id number] Date of Birth: 09-28-1945   Medicare Important Message Given:  Yes    Kerin Salen 01/04/2018, 1:36 PMImportant Message  Patient Details  Name: Chase Smith. MRN: [id number] Date of Birth: 02/28/1946   Medicare Important Message Given:  Yes    Kerin Salen 01/04/2018, 1:36 PM

## 2018-01-04 NOTE — Progress Notes (Signed)
[facility] transport set up for tomorrow to be at [facility] at 0930.  Bedside RN aware.  Vista Lawman, RN

## 2018-01-04 NOTE — Progress Notes (Signed)
INFECTIOUS DISEASE PROGRESS NOTE  ID: Chase Smith. is a 72 y.o. male with  Principal Problem:   Bacteremia Active Problems:   MVP (mitral valve prolapse)   Benign essential HTN   PAF (paroxysmal atrial fibrillation) (HCC)  Subjective: Feels much better  Abtx:  Anti-infectives (From admission, onward)   Start     Dose/Rate Route Frequency Ordered Stop   01/02/18 2300  vancomycin (VANCOCIN) 1,750 mg in sodium chloride 0.9 % 500 mL IVPB  Status:  Discontinued     1,750 mg 250 mL/hr over 120 Minutes Intravenous Every 24 hours 01/02/18 0641 01/02/18 1535   01/02/18 1600  ceFAZolin (ANCEF) IVPB 2g/100 mL premix     2 g 200 mL/hr over 30 Minutes Intravenous Every 8 hours 01/02/18 1536     01/02/18 0800  doxycycline (VIBRAMYCIN) 100 mg in sodium chloride 0.9 % 250 mL IVPB  Status:  Discontinued     100 mg 125 mL/hr over 120 Minutes Intravenous Every 12 hours 01/02/18 0603 01/02/18 1535   01/01/18 2300  vancomycin (VANCOCIN) 2,000 mg in sodium chloride 0.9 % 500 mL IVPB     2,000 mg 250 mL/hr over 120 Minutes Intravenous  Once 01/01/18 2252 01/02/18 0351   01/01/18 2300  doxycycline (VIBRA-TABS) tablet 100 mg     100 mg Oral  Once 01/01/18 2258 01/02/18 0008   01/01/18 2245  piperacillin-tazobactam (ZOSYN) IVPB 3.375 g     3.375 g 100 mL/hr over 30 Minutes Intravenous  Once 01/01/18 2240 01/01/18 2338   01/01/18 2245  vancomycin (VANCOCIN) IVPB 1000 mg/200 mL premix  Status:  Discontinued     1,000 mg 200 mL/hr over 60 Minutes Intravenous  Once 01/01/18 2240 01/01/18 2251      Medications:  Scheduled: . apixaban  5 mg Oral BID  . carvedilol  3.125 mg Oral BID  . feeding supplement (ENSURE ENLIVE)  237 mL Oral BID BM  . losartan  50 mg Oral QHS  . rosuvastatin  5 mg Oral Once per day on Mon Wed Fri    Objective: Vital signs in last 24 hours: Temp:  [97.9 F (36.6 C)-98.7 F (37.1 C)] 98.4 F (36.9 C) (07/08 1410) Pulse Rate:  [51-56] 56 (07/08 1410) Resp:   [15-16] 16 (07/08 1410) BP: (114-124)/(72-74) 116/74 (07/08 1410) SpO2:  [93 %-99 %] 93 % (07/08 1410)   General appearance: alert and no distress Resp: clear to auscultation bilaterally Cardio: regular rate and rhythm and systolic murmur: late systolic 3/6, blowing at lower left sternal border GI: normal findings: bowel sounds normal and soft, non-tender  Lab Results Recent Labs    01/03/18 0437 01/04/18 0511  WBC 6.2 6.4  HGB 13.6 13.8  HCT 40.4 41.2  NA 140 140  K 4.0 4.3  CL 108 107  CO2 25 26  BUN 14 14  CREATININE 0.95 0.93   Liver Panel Recent Labs    01/01/18 2028 01/02/18 0408  PROT 6.6 5.7*  ALBUMIN 3.7 3.1*  AST 20 18  ALT 25 21  ALKPHOS 56 47  BILITOT 0.8 1.0  BILIDIR  --  0.2  IBILI  --  0.8   Sedimentation Rate Recent Labs    01/02/18 0408  ESRSEDRATE 14   C-Reactive Protein No results for input(s): CRP in the last 72 hours.  Microbiology: Recent Results (from the past 240 hour(s))  Culture, blood (Routine x 2)     Status: Abnormal   Collection Time: 01/01/18  8:29 PM  Result Value Ref Range Status   Specimen Description   Final    BLOOD LEFT ANTECUBITAL Performed at [facility] [address], [place], [place] [address]    Special Requests   Final    BOTTLES DRAWN AEROBIC AND ANAEROBIC Blood Culture adequate volume Performed at [facility] [address], [place], [place] [address]    Culture  Setup Time   Final    IN BOTH AEROBIC AND ANAEROBIC BOTTLES GRAM POSITIVE COCCI IN CHAINS CRITICAL VALUE NOTED.  VALUE IS CONSISTENT WITH PREVIOUSLY REPORTED AND CALLED VALUE.    Culture (A)  Final    VIRIDANS STREPTOCOCCUS SUSCEPTIBILITIES PERFORMED ON PREVIOUS CULTURE WITHIN THE LAST 5 DAYS. Performed at [facility], [place] [address], [place], [place] [address]    Report Status 01/04/2018 FINAL  Final  Culture, blood (Routine x 2)     Status: Abnormal   Collection Time: 01/01/18  8:30 PM    Result Value Ref Range Status   Specimen Description   Final    BLOOD RIGHT ANTECUBITAL Performed at [facility] [address] [place], [place] [address]    Special Requests   Final    BOTTLES DRAWN AEROBIC AND ANAEROBIC Blood Culture adequate volume Performed at [facility] [address], [place], [place] [address]    Culture  Setup Time   Final    GRAM POSITIVE COCCI IN CHAINS IN BOTH AEROBIC AND ANAEROBIC BOTTLES CRITICAL RESULT CALLED TO, READ BACK BY AND VERIFIED WITH: J.GRIMSLEY,PHARMD 0130 01/03/18 M.CAMPBELL Performed at [facility], [place] [address], [place], [place] [address]    Culture VIRIDANS STREPTOCOCCUS (A)  Final   Report Status 01/04/2018 FINAL  Final   Organism ID, Bacteria VIRIDANS STREPTOCOCCUS  Final      Susceptibility   Viridans streptococcus - MIC*    PENICILLIN <=0.06 SENSITIVE Sensitive     CEFTRIAXONE <=0.12 SENSITIVE Sensitive     ERYTHROMYCIN <=0.12 SENSITIVE Sensitive     LEVOFLOXACIN 1 SENSITIVE Sensitive     VANCOMYCIN 1 SENSITIVE Sensitive     * VIRIDANS STREPTOCOCCUS  Blood Culture ID Panel (Reflexed)     Status: Abnormal   Collection Time: 01/01/18  8:30 PM  Result Value Ref Range Status   Enterococcus species NOT DETECTED NOT DETECTED Final   Listeria monocytogenes NOT DETECTED NOT DETECTED Final   Staphylococcus species NOT DETECTED NOT DETECTED Final   Staphylococcus aureus NOT DETECTED NOT DETECTED Final   Streptococcus species DETECTED (A) NOT DETECTED Final    Comment: Not Enterococcus species, Streptococcus agalactiae, Streptococcus pyogenes, or Streptococcus pneumoniae. CRITICAL RESULT CALLED TO, READ BACK BY AND VERIFIED WITH: J.GRIMSLEY,PHARMD 01/03/18 M.CAMPBELL    Streptococcus agalactiae NOT DETECTED NOT DETECTED Final   Streptococcus pneumoniae NOT DETECTED NOT DETECTED Final   Streptococcus pyogenes NOT DETECTED NOT DETECTED Final   Acinetobacter baumannii NOT DETECTED NOT  DETECTED Final   Enterobacteriaceae species NOT DETECTED NOT DETECTED Final   Enterobacter cloacae complex NOT DETECTED NOT DETECTED Final   Escherichia coli NOT DETECTED NOT DETECTED Final   Klebsiella oxytoca NOT DETECTED NOT DETECTED Final   Klebsiella pneumoniae NOT DETECTED NOT DETECTED Final   Proteus species NOT DETECTED NOT DETECTED Final   Serratia marcescens NOT DETECTED NOT DETECTED Final   Haemophilus influenzae NOT DETECTED NOT DETECTED Final   Neisseria meningitidis NOT DETECTED NOT DETECTED Final   Pseudomonas aeruginosa NOT DETECTED NOT DETECTED Final   Candida albicans NOT DETECTED  NOT DETECTED Final   Candida glabrata NOT DETECTED NOT DETECTED Final   Candida krusei NOT DETECTED NOT DETECTED Final   Candida parapsilosis NOT DETECTED NOT DETECTED Final   Candida tropicalis NOT DETECTED NOT DETECTED Final    Comment: Performed at [facility], [place] [address], [place], [place] [address]  Urine culture     Status: None   Collection Time: 01/01/18 10:40 PM  Result Value Ref Range Status   Specimen Description   Final    URINE, CLEAN CATCH Performed at [facility], [place] [address] [place], [place] [address]    Special Requests   Final    Normal Performed at [facility], [place] [address] [place], [place] [address]    Culture   Final    NO GROWTH Performed at [facility], [place] [address] [place], [place] [address]    Report Status 01/03/2018 FINAL  Final    Studies/Results: No results found.   Assessment/Plan: ? MV IE Viridans strep bacteremia Hx of MV regurg  Total days of antibiotics: 3 (ancef)     Will change to ceftriaxone 2 g qday BCx sent 7-7 Await BCx result to place PIC line.  Await result of TEE to determine length of therapy.  TEE tomorrow Spoke with pt, wife at length on possible etiology (dental cleaning ~ 2 months ago?), length of therapy, options (and side effects) of different anbx  regimens, risk factors for need to have MVR ( large vegetation, heart failure, ect) Will f/u in AM.   I spent a total of 20 minutes with this patient. Greater than 50% of this time was spent counseling and coordinating care for this patient.        Bobby Rumpf MD, FACP Infectious Diseases (pager) [phone] [email or web address] 01/04/2018, 4:00 PM  LOS: 3 days

## 2018-01-05 ENCOUNTER — Inpatient Hospital Stay (HOSPITAL_COMMUNITY): Payer: Medicare Other

## 2018-01-05 ENCOUNTER — Inpatient Hospital Stay (HOSPITAL_COMMUNITY): Payer: Medicare Other | Admitting: Anesthesiology

## 2018-01-05 ENCOUNTER — Encounter (HOSPITAL_COMMUNITY): Payer: Self-pay | Admitting: *Deleted

## 2018-01-05 ENCOUNTER — Encounter (HOSPITAL_COMMUNITY)
Admission: EM | Disposition: A | Discharge status: Home Health | Payer: Self-pay | Source: Home / Self Care | Attending: Internal Medicine

## 2018-01-05 DIAGNOSIS — Z8679 Personal history of other diseases of the circulatory system: Secondary | ICD-10-CM

## 2018-01-05 DIAGNOSIS — I340 Nonrheumatic mitral (valve) insufficiency: Secondary | ICD-10-CM

## 2018-01-05 DIAGNOSIS — I351 Nonrheumatic aortic (valve) insufficiency: Secondary | ICD-10-CM

## 2018-01-05 DIAGNOSIS — B954 Other streptococcus as the cause of diseases classified elsewhere: Secondary | ICD-10-CM

## 2018-01-05 DIAGNOSIS — R7881 Bacteremia: Principal | ICD-10-CM

## 2018-01-05 HISTORY — PX: TEE WITHOUT CARDIOVERSION: SHX5443

## 2018-01-05 LAB — CBC
HEMATOCRIT: 41.4 % (ref 39.0–52.0)
HEMOGLOBIN: 13.9 g/dL (ref 13.0–17.0)
MCH: 30.5 pg (ref 26.0–34.0)
MCHC: 33.6 g/dL (ref 30.0–36.0)
MCV: 90.8 fL (ref 78.0–100.0)
Platelets: 309 10*3/uL (ref 150–400)
RBC: 4.56 MIL/uL (ref 4.22–5.81)
RDW: 13.1 % (ref 11.5–15.5)
WBC: 6.6 10*3/uL (ref 4.0–10.5)

## 2018-01-05 LAB — BASIC METABOLIC PANEL
Anion gap: 9 (ref 5–15)
BUN: 13 mg/dL (ref 8–23)
CHLORIDE: 107 mmol/L (ref 98–111)
CO2: 26 mmol/L (ref 22–32)
Calcium: 8.8 mg/dL — ABNORMAL LOW (ref 8.9–10.3)
Creatinine, Ser: 0.89 mg/dL (ref 0.61–1.24)
GFR calc Af Amer: >60 mL/min (ref >60)
GFR calc non Af Amer: >60 mL/min (ref >60)
GLUCOSE: 94 mg/dL (ref 70–99)
POTASSIUM: 4.1 mmol/L (ref 3.5–5.1)
SODIUM: 142 mmol/L (ref 135–145)

## 2018-01-05 LAB — PROTIME-INR
INR: 1.16
Prothrombin Time: 14.7 seconds (ref 11.4–15.2)

## 2018-01-05 LAB — ROCKY MTN SPOTTED FVR ABS PNL(IGG+IGM)
RMSF IGM: 0.31 {index} (ref 0.00–0.89)
RMSF IgG: POSITIVE — AB

## 2018-01-05 LAB — RMSF, IGG, IFA

## 2018-01-05 SURGERY — ECHOCARDIOGRAM, TRANSESOPHAGEAL
Anesthesia: Monitor Anesthesia Care

## 2018-01-05 MED ORDER — PROPOFOL 500 MG/50ML IV EMUL
INTRAVENOUS | Status: DC | PRN
  Administered 2018-01-05: 125 ug/kg/min via INTRAVENOUS

## 2018-01-05 MED ORDER — BUTAMBEN-TETRACAINE-BENZOCAINE 2-2-14 % EX AERO
INHALATION_SPRAY | CUTANEOUS | Status: DC | PRN
  Administered 2018-01-05: 2 via TOPICAL

## 2018-01-05 MED ORDER — EPHEDRINE SULFATE 50 MG/ML IJ SOLN
INTRAMUSCULAR | Status: DC | PRN
  Administered 2018-01-05 (×2): 10 mg via INTRAVENOUS

## 2018-01-05 MED ORDER — PROPOFOL 10 MG/ML IV BOLUS
INTRAVENOUS | Status: DC | PRN
  Administered 2018-01-05 (×4): 15 mg via INTRAVENOUS

## 2018-01-05 MED ORDER — LACTATED RINGERS IV SOLN
INTRAVENOUS | Status: DC | PRN
  Administered 2018-01-05: 11:00:00 via INTRAVENOUS

## 2018-01-05 NOTE — CV Procedure (Signed)
    Transesophageal Echocardiogram Note  Chase Smith [id number] 1946-04-29  Procedure: Transesophageal Echocardiogram Indications: Bacteremia  Procedure Details Consent: Obtained Time Out: Verified patient identification, verified procedure, site/side was marked, verified correct patient position, special equipment/implants available, Radiology Safety Procedures followed,  medications/allergies/relevent history reviewed, required imaging and test results available.  Performed  Medications:  Pt sedated by anesthesia with diprovan 300 mg IV total.   Normal LV function; moderate biatrial enlargement; sclerotic aortic valve with mild AI; thickened MV with prolapse of posterior MV leaflet; linear oscillating density likely ruptured chord; cannot R/O small vegetation; severe eccentric MR. Will review with colleagues. Full report to follow.   Complications: No apparent complications Patient did tolerate procedure well.  Kirk Ruths, MD

## 2018-01-05 NOTE — Anesthesia Preprocedure Evaluation (Signed)
Anesthesia Evaluation  Patient identified by MRN, date of birth, ID band Patient awake    Reviewed: Allergy & Precautions, NPO status , Patient's Chart, lab work & pertinent test results  Airway Mallampati: I       Dental no notable dental hx. (+) Teeth Intact   Pulmonary neg pulmonary ROS,    Pulmonary exam normal breath sounds clear to auscultation       Cardiovascular hypertension, Pt. on medications and Pt. on home beta blockers +CHF  Normal cardiovascular exam Rhythm:Regular Rate:Bradycardia  Class II NYHA   Neuro/Psych negative psych ROS   GI/Hepatic negative GI ROS, Neg liver ROS,   Endo/Other  negative endocrine ROS  Renal/GU negative Renal ROS  negative genitourinary   Musculoskeletal negative musculoskeletal ROS (+)   Abdominal Normal abdominal exam  (+)   Peds  Hematology negative hematology ROS (+)   Anesthesia Other Findings [name]  ECHO COMPLETE WO IMAGING ENHANCING AGENT  Order# [id number]  Reading physician: Herminio Commons, MD Ordering physician: Dessa Phi, DO Study date: 01/02/18 Study Result   Result status: Final result                             *[facility] [employer].                        [place], [place] [address]                            [phone]  ------------------------------------------------------------------- Transthoracic Echocardiography  Patient:    Ravin, Denardo MR #:       [id number] Study Date: 01/02/2018 Gender:     M Age:        72 Height:     182.9 cm Weight:     93 kg BSA:        2.19 m^2 Pt. Status: Room:       [place], Arshad N  PERFORMING   [facility], Inpatient  ATTENDING    [place], [place]  SONOGRAPHER  Cardell Peach,  RDCS  cc:  ------------------------------------------------------------------- LV EF: 60% -   65%  ------------------------------------------------------------------- Indications:      Bacteremia 790.7.  ------------------------------------------------------------------- History:   PMH:   Atrial fibrillation.  Risk factors: Hypertension.  ------------------------------------------------------------------- Study Conclusions  - Left ventricle: The cavity size was normal. There was mild   concentric hypertrophy. Systolic function was normal. The   estimated ejection fraction was in the range of 60% to 65%. Wall   motion was normal; there were no regional wall motion   abnormalities. Features are consistent with a pseudonormal left   ventricular filling pattern, with concomitant abnormal relaxation   and increased filling pressure (grade 2 diastolic dysfunction).   Doppler parameters are consistent with high ventricular filling   pressure. - Aortic valve: Mildly calcified annulus. Trileaflet. There was   mild regurgitation. - Mitral valve: Mildly thickened leaflets, particularly at the   tips. Unclear if there  is a vegetation on the atrial side of   anterior leaflet tip vs focal thickening. Mild bileaflet   prolapse. There was moderate eccentric regurgitation.  Impressions:  - Mildly thickened leaflets, particularly at the tips. Unclear if   there is a vegetation on the atrial side of anterior leaflet tip   vs focal thickening. Consider TEE if deemed clinically indicated.  ------------------------------------------------------------------- Study data:  Comparison was made to the study of 11/17/2017.  Study status:  Routine.  Procedure:  Transthoracic echocardiography. Image quality was adequate.  Study completion:  There were no complications.          Transthoracic echocardiography.  M-mode, complete 2D, spectral Doppler, and color Doppler.  Birthdate: Patient  birthdate: [birth year].  Age:  Patient is 72 yr old.  Sex: Gender: male.    BMI: 27.8 kg/m^2.  Blood pressure:     126/64 Patient status:  Inpatient.  Study date:  Study date: 01/02/2018. Study time: 11:41 AM.  Location:  Bedside.  -------------------------------------------------------------------  ------------------------------------------------------------------- Left ventricle:  The cavity size was normal. There was mild concentric hypertrophy. Systolic function was normal. The estimated ejection fraction was in the range of 60% to 65%. Wall motion was normal; there were no regional wall motion abnormalities. Features are consistent with a pseudonormal left ventricular filling pattern, with concomitant abnormal relaxation and increased filling pressure (grade 2 diastolic dysfunction). Doppler parameters are consistent with high ventricular filling pressure.  ------------------------------------------------------------------- Aortic valve:   Mildly calcified annulus. Trileaflet.  Doppler: There was no stenosis.   There was mild regurgitation.  ------------------------------------------------------------------- Aorta:  Aortic root: The aortic root was normal in size.  ------------------------------------------------------------------- Mitral valve:  Mildly thickened leaflets, particularly at the tips. Unclear if there is a vegetation on the atrial side of anterior leaflet tip vs focal thickening. Mild bileaflet prolapse.  Doppler:  There was moderate eccentric regurgitation.    Peak gradient (D): 7 mm Hg.  ------------------------------------------------------------------- Left atrium:  The atrium was normal in size.  ------------------------------------------------------------------- Atrial septum:  No defect or patent foramen ovale was identified.   ------------------------------------------------------------------- Right ventricle:  The cavity size was normal. Wall  thickness was normal. Systolic function was normal.  ------------------------------------------------------------------- Pulmonic valve:    The valve appears to be grossly normal. Doppler:  There was trivial regurgitation.  ------------------------------------------------------------------- Tricuspid valve:   Structurally normal valve.   Leaflet separation was normal.  Doppler:  Transvalvular velocity was within the normal range. There was trivial regurgitation.  ------------------------------------------------------------------- Right atrium:  The atrium was normal in size.  ------------------------------------------------------------------- Pericardium:  There was no pericardial effusion.  ------------------------------------------------------------------- Systemic veins: Inferior vena cava: The vessel was normal in size. The respirophasic diameter changes were in the normal range (>= 50%), consistent with normal central venous pressure.  ------------------------------------------------------------------- Measurements   Left ventricle                         Value        Reference  LV ID, ED, PLAX chordal        (H)     56.2  mm     43 - 52  LV ID, ES, PLAX chordal                37.1  mm     23 - 38  LV fx shortening, PLAX chordal         34    %      >=29  LV PW thickness,  ED                    11.8  mm     ----------  IVS/LV PW ratio, ED                    1.07         <=1.3  Stroke volume, 2D                      90    ml     ----------  Stroke volume/bsa, 2D                  41    ml/m^2 ----------  LV e&', lateral                         5.55  cm/s   ----------  LV E/e&', lateral                       23.24        ----------  LV e&', medial                          9.25  cm/s   ----------  LV E/e&', medial                        13.95        ----------  LV e&', average                         7.4   cm/s   ----------  LV E/e&', average                       17.43         ----------    Ventricular septum                     Value        Reference  IVS thickness, ED                      12.6  mm     ----------    LVOT                                   Value        Reference  LVOT ID, S                             21    mm     ----------  LVOT area                              3.46  cm^2   ----------  LVOT peak velocity, S                  108   cm/s   ----------  LVOT mean velocity, S                  72    cm/s   ----------  LVOT VTI, S  26.1  cm     ----------    Aorta                                  Value        Reference  Aortic root ID, ED                     34    mm     ----------    Left atrium                            Value        Reference  LA volume, ES, 1-p A4C                 67.4  ml     ----------  LA volume/bsa, ES, 1-p A4C             30.8  ml/m^2 ----------    Mitral valve                           Value        Reference  Mitral E-wave peak velocity            129   cm/s   ----------  Mitral A-wave peak velocity            104   cm/s   ----------  Mitral deceleration time       (H)     320   ms     150 - 230  Mitral peak gradient, D                7     mm Hg  ----------  Mitral E/A ratio, peak                 1.2          ----------    Right atrium                           Value        Reference  RA ID, S-I, ES, A4C            (H)     56.5  mm     34 - 49  RA area, ES, A4C                       17.7  cm^2   8.3 - 19.5  RA volume, ES, A/L                     43.4  ml     ----------  RA volume/bsa, ES, A/L                 19.8  ml/m^2 ----------    Systemic veins                         Value        Reference  Estimated CVP                          3     mm Hg  ----------    Right ventricle  Value        Reference  TAPSE                                  26.2  mm     ----------  RV s&', lateral, S                      11.7  cm/s   ----------  Legend: (L)  and  (H)  mark  values outside specified reference range.  ------------------------------------------------------------------- Prepared and Electronically Authenticated by  Kate Sable, MD 2019-07-06T12:40:58 [facility] Images   Show images for ECHOCARDIOGRAM COMPLETE Patient Information   Patient Name Xzander, Gilham. Sex Male DOB January 29, 1946 SSN [id number] Reason for Exam  Priority: Routine  Not on file Surgical History   Surgical History    No past medical history on file.  Other Surgical History    Procedure Laterality Date Comment Source LAPAROSCOPIC APPENDECTOMY N/A 08/11/2012 Procedure: APPENDECTOMY LAPAROSCOPIC; Surgeon: Stark Klein, MD; Location: [facility]; Service: General; Laterality: N/A; Provider LAPAROSCOPIC SMALL BOWEL RESECTION N/A 08/11/2012 Procedure: Diagnostic Laparoscopy, Diverticulectomy and laparoscopic appendectomy; Surgeon: Stark Klein, MD; Location: [facility]; Service: General; Laterality: N/A; Diagnostic Laparoscopy, Small Bowel Resection vs Diverticulectomy  Provider PILONIDAL CYST EXCISION    Provider RECONSTRUCTION OF NOSE   secondary to deviated septum Provider TEE WITHOUT CARDIOVERSION N/A 08/08/2013 Procedure: TRANSESOPHAGEAL ECHOCARDIOGRAM (TEE); Surgeon: Sueanne Margarita, MD; Location: [facility] ENDOSCOPY; Service: Cardiovascular; Laterality: N/A; Provider TONSILLECTOMY    Provider  Patient Data   Height  72 in  BP  126/64 mmHg    Performing Technologist/Nurse   Performing Technologist/Nurse: Melvenia Beam T          Implants    No active implants to display in this view. Order-Level Documents:   There are no order-level documents.  Encounter-Level Documents - 01/01/2018:   Document on 01/02/2018 12:12 AM by Sharlett Iles, MD: ED PB Billing Extract  Electronic signature on 01/01/2018 11:37 PM - Signed  Electronic signature on 01/01/2018 11:37 PM - Signed    Signed   Electronically signed by Herminio Commons, MD on  01/02/18 at 1241 EDT Printable Result Report    Result Report  External Result Report    External Result Report     Reproductive/Obstetrics                             Anesthesia Physical Anesthesia Plan  ASA: II  Anesthesia Plan: MAC   Post-op Pain Management:    Induction: Intravenous  PONV Risk Score and Plan: 1  Airway Management Planned: Natural Airway and Simple Face Mask  Additional Equipment:   Intra-op Plan:   Post-operative Plan:   Informed Consent: I have reviewed the patients History and Physical, chart, labs and discussed the procedure including the risks, benefits and alternatives for the proposed anesthesia with the patient or authorized representative who has indicated his/her understanding and acceptance.   Dental advisory given  Plan Discussed with: CRNA  Anesthesia Plan Comments:         Anesthesia Quick Evaluation

## 2018-01-05 NOTE — Progress Notes (Signed)
    [facility] has been requested to perform a transesophageal echocardiogram on Chase Smith. for bacteremia.  After careful review of history and examination, the risks and benefits of transesophageal echocardiogram have been explained including risks of esophageal damage, perforation (1:10,000 risk), bleeding, pharyngeal hematoma as well as other potential complications associated with conscious sedation including aspiration, arrhythmia, respiratory failure and death. Alternatives to treatment were discussed, questions were answered. Patient is willing to proceed.   Lyda Jester, PA-C  01/05/2018 8:03 AM

## 2018-01-05 NOTE — Anesthesia Procedure Notes (Signed)
Procedure Name: MAC Date/Time: 01/05/2018 11:01 AM Performed by: White, Amedeo Plenty, CRNA Pre-anesthesia Checklist: Patient identified, Emergency Drugs available, Suction available and Patient being monitored Patient Re-evaluated:Patient Re-evaluated prior to induction Oxygen Delivery Method: Nasal cannula

## 2018-01-05 NOTE — H&P (View-Only) (Signed)
PROGRESS NOTE    Chase Smith.  EXH:[id number] DOB: [birth year] DOA: 01/01/2018 PCP: Josetta Huddle, MD     Brief Narrative:  Chase Smith. is a 72 y.o.malewithhistory of mitral valve prolapse, hypertension, atrial fibrillation has been experiencing subjective feeling of fever and chills over the last 10 days. Patient states that he noticed a tick on him a month ago. Over the last 10 days he has been having subjective feeling of fever, chills, flulike symptoms with mild headache. Three days ago he went to his primary care physician and had blood work done and was told yesterday evening that his blood cultures grew gram-positive cocci in 4 out of 4 bottles. He was instructed to come to the ER. Patient states that his [facility] spotted fever titers were negative. Denies any rash over the body of any joint swelling. He was admitted for MSSA bacteremia.   New events last 24 hours / Subjective: Doing well this morning, no complaints. To [facility] this morning for TEE.   Assessment & Plan:   Principal Problem:   Bacteremia Active Problems:   MVP (mitral valve prolapse)   Benign essential HTN   PAF (paroxysmal atrial fibrillation) (HCC)  Strep bacteremia -Echo revealed mildly thickened leaflets at the tips, unclear if vegetation on atrial side of anterior leaflet tip vs focal thickening  -Outpatient blood cultures 7/3 showed strep mutans  -Repeat blood cultures 7/5 showed strep viridans, pansensitive  -Repeat blood culture 7/7 pending -Rocephin IV  -ID following -TEE 7/9   Paroxysmal A fib -Continue coreg, eliquis   HTN -Continue cozaar  HLD -Continue crestor   DVT prophylaxis: Eliquis Code Status: Full Family Communication: No family bedside Disposition Plan: Pending further work up, bacteremia clearance, ID recommendation for IV antibiotics    Consultants:   ID  Cardiology for TEE   Procedures:   None   Antimicrobials:  Anti-infectives (From  admission, onward)   Start     Dose/Rate Route Frequency Ordered Stop   01/04/18 1700  [MAR Hold]  cefTRIAXone (ROCEPHIN) 2 g in sodium chloride 0.9 % 100 mL IVPB     (MAR Hold since Tue 01/05/2018 at 0908. Reason: Transfer to a Procedural area.)   2 g 200 mL/hr over 30 Minutes Intravenous Daily 01/04/18 1623     01/02/18 2300  vancomycin (VANCOCIN) 1,750 mg in sodium chloride 0.9 % 500 mL IVPB  Status:  Discontinued     1,750 mg 250 mL/hr over 120 Minutes Intravenous Every 24 hours 01/02/18 0641 01/02/18 1535   01/02/18 1600  ceFAZolin (ANCEF) IVPB 2g/100 mL premix  Status:  Discontinued     2 g 200 mL/hr over 30 Minutes Intravenous Every 8 hours 01/02/18 1536 01/04/18 1623   01/02/18 0800  doxycycline (VIBRAMYCIN) 100 mg in sodium chloride 0.9 % 250 mL IVPB  Status:  Discontinued     100 mg 125 mL/hr over 120 Minutes Intravenous Every 12 hours 01/02/18 0603 01/02/18 1535   01/01/18 2300  vancomycin (VANCOCIN) 2,000 mg in sodium chloride 0.9 % 500 mL IVPB     2,000 mg 250 mL/hr over 120 Minutes Intravenous  Once 01/01/18 2252 01/02/18 0351   01/01/18 2300  doxycycline (VIBRA-TABS) tablet 100 mg     100 mg Oral  Once 01/01/18 2258 01/02/18 0008   01/01/18 2245  piperacillin-tazobactam (ZOSYN) IVPB 3.375 g     3.375 g 100 mL/hr over 30 Minutes Intravenous  Once 01/01/18 2240 01/01/18 2338   01/01/18 2245  vancomycin (  VANCOCIN) IVPB 1000 mg/200 mL premix  Status:  Discontinued     1,000 mg 200 mL/hr over 60 Minutes Intravenous  Once 01/01/18 2240 01/01/18 2251       Objective: Vitals:   01/04/18 1410 01/04/18 2037 01/05/18 0527 01/05/18 0912  BP: 116/74 122/75 115/69 110/60  Pulse: (!) 56 (!) 56 (!) 51 (!) 51  Resp: 16 20 16 18   Temp: 98.4 F (36.9 C) 98.1 F (36.7 C) 98.3 F (36.8 C) 98.8 F (37.1 C)  TempSrc: Oral Oral Oral Oral  SpO2: 93% 98% 98% 97%  Weight:      Height:        Intake/Output Summary (Last 24 hours) at 01/05/2018 0915 Last data filed at 01/05/2018  0544 Gross per 24 hour  Intake 340 ml  Output -  Net 340 ml   Filed Weights   01/01/18 2000 01/02/18 0059  Weight: 93.9 kg (207 lb) 93 kg (205 lb 0.4 oz)    Examination: General exam: Appears calm and comfortable  Respiratory system: Clear to auscultation. Respiratory effort normal. Cardiovascular system: S1 & S2 heard, RRR. No JVD, murmurs, rubs, gallops or clicks. No pedal edema. Gastrointestinal system: Abdomen is nondistended, soft and nontender. No organomegaly or masses felt. Normal bowel sounds heard. Central nervous system: Alert and oriented. No focal neurological deficits. Extremities: Symmetric 5 x 5 power. Skin: No rashes, lesions or ulcers Psychiatry: Judgement and insight appear normal. Mood & affect appropriate.    Data Reviewed: I have personally reviewed following labs and imaging studies  CBC: Recent Labs  Lab 01/01/18 2028 01/02/18 0408 01/03/18 0437 01/04/18 0511 01/05/18 0415  WBC 8.3 6.5 6.2 6.4 6.6  NEUTROABS 6.2  --   --   --   --   HGB 13.8 12.5* 13.6 13.8 13.9  HCT 40.3 37.0* 40.4 41.2 41.4  MCV 89.8 90.2 91.0 91.2 90.8  PLT 266 207 258 282 956   Basic Metabolic Panel: Recent Labs  Lab 01/01/18 2028 01/02/18 0408 01/03/18 0437 01/04/18 0511 01/05/18 0415  NA 137 141 140 140 142  K 3.8 3.6 4.0 4.3 4.1  CL 107 109 108 107 107  CO2 21* 25 25 26 26   GLUCOSE 102* 97 95 97 94  BUN 25* 21 14 14 13   CREATININE 1.16 0.99 0.95 0.93 0.89  CALCIUM 8.6* 8.1* 8.5* 8.5* 8.8*   GFR: Estimated Creatinine Clearance: 84.8 mL/min (by C-G formula based on SCr of 0.89 mg/dL). Liver Function Tests: Recent Labs  Lab 01/01/18 2028 01/02/18 0408  AST 20 18  ALT 25 21  ALKPHOS 56 47  BILITOT 0.8 1.0  PROT 6.6 5.7*  ALBUMIN 3.7 3.1*   No results for input(s): LIPASE, AMYLASE in the last 168 hours. No results for input(s): AMMONIA in the last 168 hours. Coagulation Profile: Recent Labs  Lab 01/01/18 2028 01/05/18 0415  INR 1.21 1.16    Cardiac Enzymes: No results for input(s): CKTOTAL, CKMB, CKMBINDEX, TROPONINI in the last 168 hours. BNP (last 3 results) No results for input(s): PROBNP in the last 8760 hours. HbA1C: No results for input(s): HGBA1C in the last 72 hours. CBG: No results for input(s): GLUCAP in the last 168 hours. Lipid Profile: No results for input(s): CHOL, HDL, LDLCALC, TRIG, CHOLHDL, LDLDIRECT in the last 72 hours. Thyroid Function Tests: No results for input(s): TSH, T4TOTAL, FREET4, T3FREE, THYROIDAB in the last 72 hours. Anemia Panel: No results for input(s): VITAMINB12, FOLATE, FERRITIN, TIBC, IRON, RETICCTPCT in the last 72 hours. Sepsis  Labs: Recent Labs  Lab 01/01/18 2034 01/01/18 2318  LATICACIDVEN 0.63 0.70    Recent Results (from the past 240 hour(s))  Culture, blood (Routine x 2)     Status: Abnormal   Collection Time: 01/01/18  8:29 PM  Result Value Ref Range Status   Specimen Description   Final    BLOOD LEFT ANTECUBITAL Performed at [facility] [address] [place], [place] [address]    Special Requests   Final    BOTTLES DRAWN AEROBIC AND ANAEROBIC Blood Culture adequate volume Performed at [facility] [address] [place], [place] [address]    Culture  Setup Time   Final    IN BOTH AEROBIC AND ANAEROBIC BOTTLES GRAM POSITIVE COCCI IN CHAINS CRITICAL VALUE NOTED.  VALUE IS CONSISTENT WITH PREVIOUSLY REPORTED AND CALLED VALUE.    Culture (A)  Final    VIRIDANS STREPTOCOCCUS SUSCEPTIBILITIES PERFORMED ON PREVIOUS CULTURE WITHIN THE LAST 5 DAYS. Performed at [facility], [place] [address], [place], [place] [address]    Report Status 01/04/2018 FINAL  Final  Culture, blood (Routine x 2)     Status: Abnormal   Collection Time: 01/01/18  8:30 PM  Result Value Ref Range Status   Specimen Description   Final    BLOOD RIGHT ANTECUBITAL Performed at [facility] [address], [place], [place]  [address]    Special Requests   Final    BOTTLES DRAWN AEROBIC AND ANAEROBIC Blood Culture adequate volume Performed at [facility] [address], [place], [place] [address]    Culture  Setup Time   Final    GRAM POSITIVE COCCI IN CHAINS IN BOTH AEROBIC AND ANAEROBIC BOTTLES CRITICAL RESULT CALLED TO, READ BACK BY AND VERIFIED WITH: J.GRIMSLEY,PHARMD 0130 01/03/18 M.CAMPBELL Performed at [facility], [place] [address], [place], [place] [address]    Culture VIRIDANS STREPTOCOCCUS (A)  Final   Report Status 01/04/2018 FINAL  Final   Organism ID, Bacteria VIRIDANS STREPTOCOCCUS  Final      Susceptibility   Viridans streptococcus - MIC*    PENICILLIN <=0.06 SENSITIVE Sensitive     CEFTRIAXONE <=0.12 SENSITIVE Sensitive     ERYTHROMYCIN <=0.12 SENSITIVE Sensitive     LEVOFLOXACIN 1 SENSITIVE Sensitive     VANCOMYCIN 1 SENSITIVE Sensitive     * VIRIDANS STREPTOCOCCUS  Blood Culture ID Panel (Reflexed)     Status: Abnormal   Collection Time: 01/01/18  8:30 PM  Result Value Ref Range Status   Enterococcus species NOT DETECTED NOT DETECTED Final   Listeria monocytogenes NOT DETECTED NOT DETECTED Final   Staphylococcus species NOT DETECTED NOT DETECTED Final   Staphylococcus aureus NOT DETECTED NOT DETECTED Final   Streptococcus species DETECTED (A) NOT DETECTED Final    Comment: Not Enterococcus species, Streptococcus agalactiae, Streptococcus pyogenes, or Streptococcus pneumoniae. CRITICAL RESULT CALLED TO, READ BACK BY AND VERIFIED WITH: J.GRIMSLEY,PHARMD 01/03/18 M.CAMPBELL    Streptococcus agalactiae NOT DETECTED NOT DETECTED Final   Streptococcus pneumoniae NOT DETECTED NOT DETECTED Final   Streptococcus pyogenes NOT DETECTED NOT DETECTED Final   Acinetobacter baumannii NOT DETECTED NOT DETECTED Final   Enterobacteriaceae species NOT DETECTED NOT DETECTED Final   Enterobacter cloacae complex NOT DETECTED NOT DETECTED Final   Escherichia coli NOT DETECTED NOT  DETECTED Final   Klebsiella oxytoca NOT DETECTED NOT DETECTED Final   Klebsiella pneumoniae NOT DETECTED NOT DETECTED Final   Proteus species NOT DETECTED NOT DETECTED  Final   Serratia marcescens NOT DETECTED NOT DETECTED Final   Haemophilus influenzae NOT DETECTED NOT DETECTED Final   Neisseria meningitidis NOT DETECTED NOT DETECTED Final   Pseudomonas aeruginosa NOT DETECTED NOT DETECTED Final   Candida albicans NOT DETECTED NOT DETECTED Final   Candida glabrata NOT DETECTED NOT DETECTED Final   Candida krusei NOT DETECTED NOT DETECTED Final   Candida parapsilosis NOT DETECTED NOT DETECTED Final   Candida tropicalis NOT DETECTED NOT DETECTED Final    Comment: Performed at [facility], [place] [address] [place], [place] [address]  Urine culture     Status: None   Collection Time: 01/01/18 10:40 PM  Result Value Ref Range Status   Specimen Description   Final    URINE, CLEAN CATCH Performed at [facility], [place] [address], [place], [place] [address]    Special Requests   Final    Normal Performed at [facility], [place] [address] [place], [place] [address]    Culture   Final    NO GROWTH Performed at [facility], [place] [address] [place], [place] [address]    Report Status 01/03/2018 FINAL  Final       Radiology Studies: No results found.    Scheduled Meds: . [MAR Hold] apixaban  5 mg Oral BID  . [MAR Hold] carvedilol  3.125 mg Oral BID  . [MAR Hold] feeding supplement (ENSURE ENLIVE)  237 mL Oral BID BM  . [MAR Hold] losartan  50 mg Oral QHS  . [MAR Hold] rosuvastatin  5 mg Oral Once per day on Mon Wed Fri   Continuous Infusions: . [MAR Hold] sodium chloride Stopped (01/02/18 0351)  . sodium chloride 20 mL/hr at 01/04/18 2113  . [MAR Hold] cefTRIAXone (ROCEPHIN)  IV Stopped (01/04/18 1744)     LOS: 4 days    Time spent: 20 minutes   Dessa Phi, DO [facility] [email or web address] Password [email or web address] 01/05/2018,  9:15 AM

## 2018-01-05 NOTE — Transfer of Care (Signed)
Immediate Anesthesia Transfer of Care Note  Patient: Chase Smith.  Procedure(s) Performed: TRANSESOPHAGEAL ECHOCARDIOGRAM (TEE) (N/A )  Patient Location: Endoscopy Unit  Anesthesia Type:MAC  Level of Consciousness: drowsy and responds to stimulation  Airway & Oxygen Therapy: Patient Spontanous Breathing and Patient connected to nasal cannula oxygen  Post-op Assessment: Report given to RN and Post -op Vital signs reviewed and stable  Post vital signs: Reviewed and stable  Last Vitals:  Vitals Value Taken Time  BP 108/59 01/05/2018 11:27 AM  Temp    Pulse 62 01/05/2018 11:28 AM  Resp 12 01/05/2018 11:28 AM  SpO2 96 % 01/05/2018 11:28 AM  Vitals shown include unvalidated device data.  Last Pain:  Vitals:   01/05/18 1127  TempSrc:   PainSc: 0-No pain      Patients Stated Pain Goal: 2 (28/41/32 4401)  Complications: No apparent anesthesia complications

## 2018-01-05 NOTE — Progress Notes (Signed)
Per [facility] infusion rep, they are following for home IV abx. They will partner with [facility] in [place] to provide needed home health sevrices. Will need home health orders at discharge. Marney Doctor RN,BSN,NCM [phone]

## 2018-01-05 NOTE — Interval H&P Note (Signed)
History and Physical Interval Note:  01/05/2018 11:02 AM  Chase Smith.  has presented today for surgery, with the diagnosis of BACTEREMIA  The various methods of treatment have been discussed with the patient and family. After consideration of risks, benefits and other options for treatment, the patient has consented to  Procedure(s): TRANSESOPHAGEAL ECHOCARDIOGRAM (TEE) (N/A) as a surgical intervention .  The patient's history has been reviewed, patient examined, no change in status, stable for surgery.  I have reviewed the patient's chart and labs.  Questions were answered to the patient's satisfaction.     Kirk Ruths

## 2018-01-05 NOTE — Progress Notes (Addendum)
PROGRESS NOTE    Chase Smith.  KKX:[id number] DOB: [birth year] DOA: 01/01/2018 PCP: Josetta Huddle, MD     Brief Narrative:  Chase Smith. is a 72 y.o.malewithhistory of mitral valve prolapse, hypertension, atrial fibrillation has been experiencing subjective feeling of fever and chills over the last 10 days. Patient states that he noticed a tick on him a month ago. Over the last 10 days he has been having subjective feeling of fever, chills, flulike symptoms with mild headache. Three days ago he went to his primary care physician and had blood work done and was told yesterday evening that his blood cultures grew gram-positive cocci in 4 out of 4 bottles. He was instructed to come to the ER. Patient states that his [facility] spotted fever titers were negative. Denies any rash over the body of any joint swelling. He was admitted for MSSA bacteremia.   New events last 24 hours / Subjective: Doing well this morning, no complaints. To [facility] this morning for TEE.   Assessment & Plan:   Principal Problem:   Bacteremia Active Problems:   MVP (mitral valve prolapse)   Benign essential HTN   PAF (paroxysmal atrial fibrillation) (HCC)  Strep bacteremia -Echo revealed mildly thickened leaflets at the tips, unclear if vegetation on atrial side of anterior leaflet tip vs focal thickening  -Outpatient blood cultures 7/3 showed strep mutans  -Repeat blood cultures 7/5 showed strep viridans, pansensitive  -Repeat blood culture 7/7 pending -Rocephin IV  -ID following -TEE 7/9   Paroxysmal A fib -Continue coreg, eliquis   HTN -Continue cozaar  HLD -Continue crestor   DVT prophylaxis: Eliquis Code Status: Full Family Communication: No family bedside Disposition Plan: Pending further work up, bacteremia clearance, ID recommendation for IV antibiotics    Consultants:   ID  Cardiology for TEE   Procedures:   None   Antimicrobials:  Anti-infectives (From  admission, onward)   Start     Dose/Rate Route Frequency Ordered Stop   01/04/18 1700  [MAR Hold]  cefTRIAXone (ROCEPHIN) 2 g in sodium chloride 0.9 % 100 mL IVPB     (MAR Hold since Tue 01/05/2018 at 0908. Reason: Transfer to a Procedural area.)   2 g 200 mL/hr over 30 Minutes Intravenous Daily 01/04/18 1623     01/02/18 2300  vancomycin (VANCOCIN) 1,750 mg in sodium chloride 0.9 % 500 mL IVPB  Status:  Discontinued     1,750 mg 250 mL/hr over 120 Minutes Intravenous Every 24 hours 01/02/18 0641 01/02/18 1535   01/02/18 1600  ceFAZolin (ANCEF) IVPB 2g/100 mL premix  Status:  Discontinued     2 g 200 mL/hr over 30 Minutes Intravenous Every 8 hours 01/02/18 1536 01/04/18 1623   01/02/18 0800  doxycycline (VIBRAMYCIN) 100 mg in sodium chloride 0.9 % 250 mL IVPB  Status:  Discontinued     100 mg 125 mL/hr over 120 Minutes Intravenous Every 12 hours 01/02/18 0603 01/02/18 1535   01/01/18 2300  vancomycin (VANCOCIN) 2,000 mg in sodium chloride 0.9 % 500 mL IVPB     2,000 mg 250 mL/hr over 120 Minutes Intravenous  Once 01/01/18 2252 01/02/18 0351   01/01/18 2300  doxycycline (VIBRA-TABS) tablet 100 mg     100 mg Oral  Once 01/01/18 2258 01/02/18 0008   01/01/18 2245  piperacillin-tazobactam (ZOSYN) IVPB 3.375 g     3.375 g 100 mL/hr over 30 Minutes Intravenous  Once 01/01/18 2240 01/01/18 2338   01/01/18 2245  vancomycin (  VANCOCIN) IVPB 1000 mg/200 mL premix  Status:  Discontinued     1,000 mg 200 mL/hr over 60 Minutes Intravenous  Once 01/01/18 2240 01/01/18 2251       Objective: Vitals:   01/04/18 1410 01/04/18 2037 01/05/18 0527 01/05/18 0912  BP: 116/74 122/75 115/69 110/60  Pulse: (!) 56 (!) 56 (!) 51 (!) 51  Resp: 16 20 16 18   Temp: 98.4 F (36.9 C) 98.1 F (36.7 C) 98.3 F (36.8 C) 98.8 F (37.1 C)  TempSrc: Oral Oral Oral Oral  SpO2: 93% 98% 98% 97%  Weight:      Height:        Intake/Output Summary (Last 24 hours) at 01/05/2018 0915 Last data filed at 01/05/2018  0544 Gross per 24 hour  Intake 340 ml  Output -  Net 340 ml   Filed Weights   01/01/18 2000 01/02/18 0059  Weight: 93.9 kg (207 lb) 93 kg (205 lb 0.4 oz)    Examination: General exam: Appears calm and comfortable  Respiratory system: Clear to auscultation. Respiratory effort normal. Cardiovascular system: S1 & S2 heard, RRR. No JVD, murmurs, rubs, gallops or clicks. No pedal edema. Gastrointestinal system: Abdomen is nondistended, soft and nontender. No organomegaly or masses felt. Normal bowel sounds heard. Central nervous system: Alert and oriented. No focal neurological deficits. Extremities: Symmetric 5 x 5 power. Skin: No rashes, lesions or ulcers Psychiatry: Judgement and insight appear normal. Mood & affect appropriate.    Data Reviewed: I have personally reviewed following labs and imaging studies  CBC: Recent Labs  Lab 01/01/18 2028 01/02/18 0408 01/03/18 0437 01/04/18 0511 01/05/18 0415  WBC 8.3 6.5 6.2 6.4 6.6  NEUTROABS 6.2  --   --   --   --   HGB 13.8 12.5* 13.6 13.8 13.9  HCT 40.3 37.0* 40.4 41.2 41.4  MCV 89.8 90.2 91.0 91.2 90.8  PLT 266 207 258 282 149   Basic Metabolic Panel: Recent Labs  Lab 01/01/18 2028 01/02/18 0408 01/03/18 0437 01/04/18 0511 01/05/18 0415  NA 137 141 140 140 142  K 3.8 3.6 4.0 4.3 4.1  CL 107 109 108 107 107  CO2 21* 25 25 26 26   GLUCOSE 102* 97 95 97 94  BUN 25* 21 14 14 13   CREATININE 1.16 0.99 0.95 0.93 0.89  CALCIUM 8.6* 8.1* 8.5* 8.5* 8.8*   GFR: Estimated Creatinine Clearance: 84.8 mL/min (by C-G formula based on SCr of 0.89 mg/dL). Liver Function Tests: Recent Labs  Lab 01/01/18 2028 01/02/18 0408  AST 20 18  ALT 25 21  ALKPHOS 56 47  BILITOT 0.8 1.0  PROT 6.6 5.7*  ALBUMIN 3.7 3.1*   No results for input(s): LIPASE, AMYLASE in the last 168 hours. No results for input(s): AMMONIA in the last 168 hours. Coagulation Profile: Recent Labs  Lab 01/01/18 2028 01/05/18 0415  INR 1.21 1.16    Cardiac Enzymes: No results for input(s): CKTOTAL, CKMB, CKMBINDEX, TROPONINI in the last 168 hours. BNP (last 3 results) No results for input(s): PROBNP in the last 8760 hours. HbA1C: No results for input(s): HGBA1C in the last 72 hours. CBG: No results for input(s): GLUCAP in the last 168 hours. Lipid Profile: No results for input(s): CHOL, HDL, LDLCALC, TRIG, CHOLHDL, LDLDIRECT in the last 72 hours. Thyroid Function Tests: No results for input(s): TSH, T4TOTAL, FREET4, T3FREE, THYROIDAB in the last 72 hours. Anemia Panel: No results for input(s): VITAMINB12, FOLATE, FERRITIN, TIBC, IRON, RETICCTPCT in the last 72 hours. Sepsis  Labs: Recent Labs  Lab 01/01/18 2034 01/01/18 2318  LATICACIDVEN 0.63 0.70    Recent Results (from the past 240 hour(s))  Culture, blood (Routine x 2)     Status: Abnormal   Collection Time: 01/01/18  8:29 PM  Result Value Ref Range Status   Specimen Description   Final    BLOOD LEFT ANTECUBITAL Performed at [facility] [address] [place], [place] [address]    Special Requests   Final    BOTTLES DRAWN AEROBIC AND ANAEROBIC Blood Culture adequate volume Performed at [facility] [address], [place], [place] [address]    Culture  Setup Time   Final    IN BOTH AEROBIC AND ANAEROBIC BOTTLES GRAM POSITIVE COCCI IN CHAINS CRITICAL VALUE NOTED.  VALUE IS CONSISTENT WITH PREVIOUSLY REPORTED AND CALLED VALUE.    Culture (A)  Final    VIRIDANS STREPTOCOCCUS SUSCEPTIBILITIES PERFORMED ON PREVIOUS CULTURE WITHIN THE LAST 5 DAYS. Performed at [facility], [place] [address], [place], [place] [address]    Report Status 01/04/2018 FINAL  Final  Culture, blood (Routine x 2)     Status: Abnormal   Collection Time: 01/01/18  8:30 PM  Result Value Ref Range Status   Specimen Description   Final    BLOOD RIGHT ANTECUBITAL Performed at [facility] [address] [place], [place]  [address]    Special Requests   Final    BOTTLES DRAWN AEROBIC AND ANAEROBIC Blood Culture adequate volume Performed at [facility] [address], [place], [place] [address]    Culture  Setup Time   Final    GRAM POSITIVE COCCI IN CHAINS IN BOTH AEROBIC AND ANAEROBIC BOTTLES CRITICAL RESULT CALLED TO, READ BACK BY AND VERIFIED WITH: J.GRIMSLEY,PHARMD 0130 01/03/18 M.CAMPBELL Performed at [facility], [place] [address] [place], [place] [address]    Culture VIRIDANS STREPTOCOCCUS (A)  Final   Report Status 01/04/2018 FINAL  Final   Organism ID, Bacteria VIRIDANS STREPTOCOCCUS  Final      Susceptibility   Viridans streptococcus - MIC*    PENICILLIN <=0.06 SENSITIVE Sensitive     CEFTRIAXONE <=0.12 SENSITIVE Sensitive     ERYTHROMYCIN <=0.12 SENSITIVE Sensitive     LEVOFLOXACIN 1 SENSITIVE Sensitive     VANCOMYCIN 1 SENSITIVE Sensitive     * VIRIDANS STREPTOCOCCUS  Blood Culture ID Panel (Reflexed)     Status: Abnormal   Collection Time: 01/01/18  8:30 PM  Result Value Ref Range Status   Enterococcus species NOT DETECTED NOT DETECTED Final   Listeria monocytogenes NOT DETECTED NOT DETECTED Final   Staphylococcus species NOT DETECTED NOT DETECTED Final   Staphylococcus aureus NOT DETECTED NOT DETECTED Final   Streptococcus species DETECTED (A) NOT DETECTED Final    Comment: Not Enterococcus species, Streptococcus agalactiae, Streptococcus pyogenes, or Streptococcus pneumoniae. CRITICAL RESULT CALLED TO, READ BACK BY AND VERIFIED WITH: J.GRIMSLEY,PHARMD 01/03/18 M.CAMPBELL    Streptococcus agalactiae NOT DETECTED NOT DETECTED Final   Streptococcus pneumoniae NOT DETECTED NOT DETECTED Final   Streptococcus pyogenes NOT DETECTED NOT DETECTED Final   Acinetobacter baumannii NOT DETECTED NOT DETECTED Final   Enterobacteriaceae species NOT DETECTED NOT DETECTED Final   Enterobacter cloacae complex NOT DETECTED NOT DETECTED Final   Escherichia coli NOT DETECTED NOT  DETECTED Final   Klebsiella oxytoca NOT DETECTED NOT DETECTED Final   Klebsiella pneumoniae NOT DETECTED NOT DETECTED Final   Proteus species NOT DETECTED NOT DETECTED  Final   Serratia marcescens NOT DETECTED NOT DETECTED Final   Haemophilus influenzae NOT DETECTED NOT DETECTED Final   Neisseria meningitidis NOT DETECTED NOT DETECTED Final   Pseudomonas aeruginosa NOT DETECTED NOT DETECTED Final   Candida albicans NOT DETECTED NOT DETECTED Final   Candida glabrata NOT DETECTED NOT DETECTED Final   Candida krusei NOT DETECTED NOT DETECTED Final   Candida parapsilosis NOT DETECTED NOT DETECTED Final   Candida tropicalis NOT DETECTED NOT DETECTED Final    Comment: Performed at [facility], [place] [address], [place], [place] [address]  Urine culture     Status: None   Collection Time: 01/01/18 10:40 PM  Result Value Ref Range Status   Specimen Description   Final    URINE, CLEAN CATCH Performed at [facility], [place] [address], [place], [place] [address]    Special Requests   Final    Normal Performed at [facility], [place] [address], [place], [place] [address]    Culture   Final    NO GROWTH Performed at [facility], [place] [address] [place],  [address]    Report Status 01/03/2018 FINAL  Final       Radiology Studies: No results found.    Scheduled Meds: . [MAR Hold] apixaban  5 mg Oral BID  . [MAR Hold] carvedilol  3.125 mg Oral BID  . [MAR Hold] feeding supplement (ENSURE ENLIVE)  237 mL Oral BID BM  . [MAR Hold] losartan  50 mg Oral QHS  . [MAR Hold] rosuvastatin  5 mg Oral Once per day on Mon Wed Fri   Continuous Infusions: . [MAR Hold] sodium chloride Stopped (01/02/18 0351)  . sodium chloride 20 mL/hr at 01/04/18 2113  . [MAR Hold] cefTRIAXone (ROCEPHIN)  IV Stopped (01/04/18 1744)     LOS: 4 days    Time spent: 20 minutes   Dessa Phi, DO [facility] [email or web address] Password [email or web address] 01/05/2018,  9:15 AM

## 2018-01-05 NOTE — Progress Notes (Signed)
INFECTIOUS DISEASE PROGRESS NOTE  ID: Chase Smith. is a 72 y.o. male with  Principal Problem:   Bacteremia Active Problems:   MVP (mitral valve prolapse)   Benign essential HTN   PAF (paroxysmal atrial fibrillation) (HCC)  Subjective: no complaints  Abtx:  Anti-infectives (From admission, onward)   Start     Dose/Rate Route Frequency Ordered Stop   01/04/18 1700  cefTRIAXone (ROCEPHIN) 2 g in sodium chloride 0.9 % 100 mL IVPB     2 g 200 mL/hr over 30 Minutes Intravenous Daily 01/04/18 1623     01/02/18 2300  vancomycin (VANCOCIN) 1,750 mg in sodium chloride 0.9 % 500 mL IVPB  Status:  Discontinued     1,750 mg 250 mL/hr over 120 Minutes Intravenous Every 24 hours 01/02/18 0641 01/02/18 1535   01/02/18 1600  ceFAZolin (ANCEF) IVPB 2g/100 mL premix  Status:  Discontinued     2 g 200 mL/hr over 30 Minutes Intravenous Every 8 hours 01/02/18 1536 01/04/18 1623   01/02/18 0800  doxycycline (VIBRAMYCIN) 100 mg in sodium chloride 0.9 % 250 mL IVPB  Status:  Discontinued     100 mg 125 mL/hr over 120 Minutes Intravenous Every 12 hours 01/02/18 0603 01/02/18 1535   01/01/18 2300  vancomycin (VANCOCIN) 2,000 mg in sodium chloride 0.9 % 500 mL IVPB     2,000 mg 250 mL/hr over 120 Minutes Intravenous  Once 01/01/18 2252 01/02/18 0351   01/01/18 2300  doxycycline (VIBRA-TABS) tablet 100 mg     100 mg Oral  Once 01/01/18 2258 01/02/18 0008   01/01/18 2245  piperacillin-tazobactam (ZOSYN) IVPB 3.375 g     3.375 g 100 mL/hr over 30 Minutes Intravenous  Once 01/01/18 2240 01/01/18 2338   01/01/18 2245  vancomycin (VANCOCIN) IVPB 1000 mg/200 mL premix  Status:  Discontinued     1,000 mg 200 mL/hr over 60 Minutes Intravenous  Once 01/01/18 2240 01/01/18 2251      Medications:  Scheduled: . apixaban  5 mg Oral BID  . carvedilol  3.125 mg Oral BID  . feeding supplement (ENSURE ENLIVE)  237 mL Oral BID BM  . losartan  50 mg Oral QHS  . rosuvastatin  5 mg Oral Once per day on Mon  Wed Fri    Objective: Vital signs in last 24 hours: Temp:  [97.6 F (36.4 C)-98.8 F (37.1 C)] 97.6 F (36.4 C) (07/09 1246) Pulse Rate:  [51-63] 55 (07/09 1246) Resp:  [12-20] 16 (07/09 1246) BP: (101-131)/(59-96) 131/83 (07/09 1246) SpO2:  [93 %-99 %] 99 % (07/09 1246)   General appearance: alert, cooperative and no distress Resp: clear to auscultation bilaterally Cardio: regular rate and rhythm GI: normal findings: bowel sounds normal and soft, non-tender  Lab Results Recent Labs    01/04/18 0511 01/05/18 0415  WBC 6.4 6.6  HGB 13.8 13.9  HCT 41.2 41.4  NA 140 142  K 4.3 4.1  CL 107 107  CO2 26 26  BUN 14 13  CREATININE 0.93 0.89   Liver Panel No results for input(s): PROT, ALBUMIN, AST, ALT, ALKPHOS, BILITOT, BILIDIR, IBILI in the last 72 hours. Sedimentation Rate No results for input(s): ESRSEDRATE in the last 72 hours. C-Reactive Protein No results for input(s): CRP in the last 72 hours.  Microbiology: Recent Results (from the past 240 hour(s))  Culture, blood (Routine x 2)     Status: Abnormal   Collection Time: 01/01/18  8:29 PM  Result Value Ref Range Status  Specimen Description   Final    BLOOD LEFT ANTECUBITAL Performed at [facility] [address], [place], [place] [address]    Special Requests   Final    BOTTLES DRAWN AEROBIC AND ANAEROBIC Blood Culture adequate volume Performed at [facility] [address], [place], [place] [address]    Culture  Setup Time   Final    IN BOTH AEROBIC AND ANAEROBIC BOTTLES GRAM POSITIVE COCCI IN CHAINS CRITICAL VALUE NOTED.  VALUE IS CONSISTENT WITH PREVIOUSLY REPORTED AND CALLED VALUE.    Culture (A)  Final    VIRIDANS STREPTOCOCCUS SUSCEPTIBILITIES PERFORMED ON PREVIOUS CULTURE WITHIN THE LAST 5 DAYS. Performed at [facility], [place] [address], [place], [place] [address]    Report Status 01/04/2018 FINAL  Final  Culture, blood (Routine x 2)     Status:  Abnormal   Collection Time: 01/01/18  8:30 PM  Result Value Ref Range Status   Specimen Description   Final    BLOOD RIGHT ANTECUBITAL Performed at [facility] [address], [place], [place] [address]    Special Requests   Final    BOTTLES DRAWN AEROBIC AND ANAEROBIC Blood Culture adequate volume Performed at [facility] [address], [place], [place] [address]    Culture  Setup Time   Final    GRAM POSITIVE COCCI IN CHAINS IN BOTH AEROBIC AND ANAEROBIC BOTTLES CRITICAL RESULT CALLED TO, READ BACK BY AND VERIFIED WITH: J.GRIMSLEY,PHARMD 0130 01/03/18 M.CAMPBELL Performed at [facility], [place] [address], [place], [place] [address]    Culture VIRIDANS STREPTOCOCCUS (A)  Final   Report Status 01/04/2018 FINAL  Final   Organism ID, Bacteria VIRIDANS STREPTOCOCCUS  Final      Susceptibility   Viridans streptococcus - MIC*    PENICILLIN <=0.06 SENSITIVE Sensitive     CEFTRIAXONE <=0.12 SENSITIVE Sensitive     ERYTHROMYCIN <=0.12 SENSITIVE Sensitive     LEVOFLOXACIN 1 SENSITIVE Sensitive     VANCOMYCIN 1 SENSITIVE Sensitive     * VIRIDANS STREPTOCOCCUS  Blood Culture ID Panel (Reflexed)     Status: Abnormal   Collection Time: 01/01/18  8:30 PM  Result Value Ref Range Status   Enterococcus species NOT DETECTED NOT DETECTED Final   Listeria monocytogenes NOT DETECTED NOT DETECTED Final   Staphylococcus species NOT DETECTED NOT DETECTED Final   Staphylococcus aureus NOT DETECTED NOT DETECTED Final   Streptococcus species DETECTED (A) NOT DETECTED Final    Comment: Not Enterococcus species, Streptococcus agalactiae, Streptococcus pyogenes, or Streptococcus pneumoniae. CRITICAL RESULT CALLED TO, READ BACK BY AND VERIFIED WITH: J.GRIMSLEY,PHARMD 01/03/18 M.CAMPBELL    Streptococcus agalactiae NOT DETECTED NOT DETECTED Final   Streptococcus pneumoniae NOT DETECTED NOT DETECTED Final   Streptococcus pyogenes NOT DETECTED NOT DETECTED  Final   Acinetobacter baumannii NOT DETECTED NOT DETECTED Final   Enterobacteriaceae species NOT DETECTED NOT DETECTED Final   Enterobacter cloacae complex NOT DETECTED NOT DETECTED Final   Escherichia coli NOT DETECTED NOT DETECTED Final   Klebsiella oxytoca NOT DETECTED NOT DETECTED Final   Klebsiella pneumoniae NOT DETECTED NOT DETECTED Final   Proteus species NOT DETECTED NOT DETECTED Final   Serratia marcescens NOT DETECTED NOT DETECTED Final   Haemophilus influenzae NOT DETECTED NOT DETECTED Final   Neisseria meningitidis NOT DETECTED NOT DETECTED Final   Pseudomonas aeruginosa NOT DETECTED NOT DETECTED Final   Candida albicans NOT DETECTED NOT DETECTED Final   Candida glabrata NOT  DETECTED NOT DETECTED Final   Candida krusei NOT DETECTED NOT DETECTED Final   Candida parapsilosis NOT DETECTED NOT DETECTED Final   Candida tropicalis NOT DETECTED NOT DETECTED Final    Comment: Performed at [facility], [place] [address] [place], [place] [address]  Urine culture     Status: None   Collection Time: 01/01/18 10:40 PM  Result Value Ref Range Status   Specimen Description   Final    URINE, CLEAN CATCH Performed at [facility], [place] [address], [place], [place] [address]    Special Requests   Final    Normal Performed at [facility], [place] [address] [place], [place] [address]    Culture   Final    NO GROWTH Performed at [facility], [place] [address], [place], [place] [address]    Report Status 01/03/2018 FINAL  Final  Culture, blood (routine x 2)     Status: None (Preliminary result)   Collection Time: 01/03/18  8:07 PM  Result Value Ref Range Status   Specimen Description   Final    BLOOD LEFT ANTECUBITAL Performed at [facility] [address], [place], [place] [address]    Special Requests   Final    BOTTLES DRAWN AEROBIC AND ANAEROBIC Blood Culture adequate volume Performed at [facility] [address] [place], [place] [address]    Culture   Final    NO GROWTH 1 DAY Performed at [facility], [place] [address] [place], [place] [address]    Report Status PENDING  Incomplete  Culture, blood (routine x 2)     Status: None (Preliminary result)   Collection Time: 01/03/18  8:07 PM  Result Value Ref Range Status   Specimen Description   Final    BLOOD RIGHT ANTECUBITAL Performed at [facility] [address], [place], [place] [address]    Special Requests   Final    BOTTLES DRAWN AEROBIC ONLY Blood Culture adequate volume Performed at [facility] [address], [place], [place] [address]    Culture   Final    NO GROWTH 1 DAY Performed at [facility], [place] [address] [place],  [address]    Report Status PENDING  Incomplete    Studies/Results: No results found.   Assessment/Plan: ? MV IE Viridans strep bacteremia Hx of MV regurg  Total days of antibiotics: 4 (ancef --> ceftriaxone)                                      Continue ceftriaxone 2 g qday Repeat BCx sent 7-7, ngtd Await BCx result to place PIC line. If (-) at 7-10, can place pic.  Await final read of TEE.  If equivocal, 2 weeks of ceftriaxone, then 2 weeks of po.           Bobby Rumpf MD, FACP Infectious Diseases (pager) [phone] [email or web address] 01/05/2018, 1:34 PM  LOS: 4 days

## 2018-01-06 ENCOUNTER — Encounter (HOSPITAL_COMMUNITY): Payer: Self-pay | Admitting: Cardiology

## 2018-01-06 ENCOUNTER — Inpatient Hospital Stay: Payer: Self-pay

## 2018-01-06 DIAGNOSIS — I341 Nonrheumatic mitral (valve) prolapse: Secondary | ICD-10-CM

## 2018-01-06 DIAGNOSIS — I10 Essential (primary) hypertension: Secondary | ICD-10-CM

## 2018-01-06 DIAGNOSIS — I480 Paroxysmal atrial fibrillation: Secondary | ICD-10-CM

## 2018-01-06 LAB — BASIC METABOLIC PANEL
ANION GAP: 7 (ref 5–15)
BUN: 17 mg/dL (ref 8–23)
CALCIUM: 8.7 mg/dL — AB (ref 8.9–10.3)
CO2: 24 mmol/L (ref 22–32)
Chloride: 108 mmol/L (ref 98–111)
Creatinine, Ser: 0.91 mg/dL (ref 0.61–1.24)
GFR calc Af Amer: >60 mL/min (ref >60)
GLUCOSE: 101 mg/dL — AB (ref 70–99)
POTASSIUM: 4.4 mmol/L (ref 3.5–5.1)
Sodium: 139 mmol/L (ref 135–145)

## 2018-01-06 LAB — CBC
HEMATOCRIT: 41 % (ref 39.0–52.0)
HEMOGLOBIN: 13.7 g/dL (ref 13.0–17.0)
MCH: 30.4 pg (ref 26.0–34.0)
MCHC: 33.4 g/dL (ref 30.0–36.0)
MCV: 91.1 fL (ref 78.0–100.0)
Platelets: 277 10*3/uL (ref 150–400)
RBC: 4.5 MIL/uL (ref 4.22–5.81)
RDW: 12.9 % (ref 11.5–15.5)
WBC: 6.6 10*3/uL (ref 4.0–10.5)

## 2018-01-06 MED ORDER — SODIUM CHLORIDE 0.9% FLUSH: 10.0000 mL | INTRAVENOUS | Status: DC | PRN

## 2018-01-06 MED ORDER — SODIUM CHLORIDE 0.9% FLUSH
10.0000 mL | Freq: Two times a day (BID) | INTRAVENOUS | Status: DC
  Administered 2018-01-06: 10 mL

## 2018-01-06 NOTE — Progress Notes (Signed)
PHARMACY CONSULT NOTE FOR:  OUTPATIENT  PARENTERAL ANTIBIOTIC THERAPY (OPAT)  Indication: Endocarditis Regimen: Ceftriaxone 2 g IV daily End date: August 17th, 2019  IV antibiotic discharge orders are pended. To discharging provider:  please sign these orders via discharge navigator,  Select New Orders & click on the button choice - Manage This Unsigned Work.     Thank you for allowing pharmacy to be a part of this patient's care.  Lenis Noon, PharmD 01/06/2018, 4:49 PM

## 2018-01-06 NOTE — Progress Notes (Signed)
INFECTIOUS DISEASE PROGRESS NOTE  ID: Chase Smith. is a 72 y.o. male with  Principal Problem:   Bacteremia Active Problems:   MVP (mitral valve prolapse)   Benign essential HTN   PAF (paroxysmal atrial fibrillation) (HCC)  Subjective: No complaints  Abtx:  Anti-infectives (From admission, onward)   Start     Dose/Rate Route Frequency Ordered Stop   01/04/18 1700  cefTRIAXone (ROCEPHIN) 2 g in sodium chloride 0.9 % 100 mL IVPB     2 g 200 mL/hr over 30 Minutes Intravenous Daily 01/04/18 1623     01/02/18 2300  vancomycin (VANCOCIN) 1,750 mg in sodium chloride 0.9 % 500 mL IVPB  Status:  Discontinued     1,750 mg 250 mL/hr over 120 Minutes Intravenous Every 24 hours 01/02/18 0641 01/02/18 1535   01/02/18 1600  ceFAZolin (ANCEF) IVPB 2g/100 mL premix  Status:  Discontinued     2 g 200 mL/hr over 30 Minutes Intravenous Every 8 hours 01/02/18 1536 01/04/18 1623   01/02/18 0800  doxycycline (VIBRAMYCIN) 100 mg in sodium chloride 0.9 % 250 mL IVPB  Status:  Discontinued     100 mg 125 mL/hr over 120 Minutes Intravenous Every 12 hours 01/02/18 0603 01/02/18 1535   01/01/18 2300  vancomycin (VANCOCIN) 2,000 mg in sodium chloride 0.9 % 500 mL IVPB     2,000 mg 250 mL/hr over 120 Minutes Intravenous  Once 01/01/18 2252 01/02/18 0351   01/01/18 2300  doxycycline (VIBRA-TABS) tablet 100 mg     100 mg Oral  Once 01/01/18 2258 01/02/18 0008   01/01/18 2245  piperacillin-tazobactam (ZOSYN) IVPB 3.375 g     3.375 g 100 mL/hr over 30 Minutes Intravenous  Once 01/01/18 2240 01/01/18 2338   01/01/18 2245  vancomycin (VANCOCIN) IVPB 1000 mg/200 mL premix  Status:  Discontinued     1,000 mg 200 mL/hr over 60 Minutes Intravenous  Once 01/01/18 2240 01/01/18 2251      Medications:  Scheduled: . apixaban  5 mg Oral BID  . carvedilol  3.125 mg Oral BID  . feeding supplement (ENSURE ENLIVE)  237 mL Oral BID BM  . losartan  50 mg Oral QHS  . rosuvastatin  5 mg Oral Once per day on Mon  Wed Fri    Objective: Vital signs in last 24 hours: Temp:  [97.7 F (36.5 C)-98.6 F (37 C)] 97.7 F (36.5 C) (07/10 1417) Pulse Rate:  [54-59] 59 (07/10 1417) Resp:  [16-20] 20 (07/10 1417) BP: (102-125)/(71-77) 107/71 (07/10 1417) SpO2:  [97 %-99 %] 99 % (07/10 1417)   General appearance: alert, cooperative and no distress Resp: clear to auscultation bilaterally Cardio: regular rate and rhythm GI: normal findings: bowel sounds normal and soft, non-tender  Lab Results Recent Labs    01/05/18 0415 01/06/18 0344  WBC 6.6 6.6  HGB 13.9 13.7  HCT 41.4 41.0  NA 142 139  K 4.1 4.4  CL 107 108  CO2 26 24  BUN 13 17  CREATININE 0.89 0.91   Liver Panel No results for input(s): PROT, ALBUMIN, AST, ALT, ALKPHOS, BILITOT, BILIDIR, IBILI in the last 72 hours. Sedimentation Rate No results for input(s): ESRSEDRATE in the last 72 hours. C-Reactive Protein No results for input(s): CRP in the last 72 hours.  Microbiology: Recent Results (from the past 240 hour(s))  Culture, blood (Routine x 2)     Status: Abnormal   Collection Time: 01/01/18  8:29 PM  Result Value Ref Range Status  Specimen Description   Final    BLOOD LEFT ANTECUBITAL Performed at [facility] [address], [place], [place] [address]    Special Requests   Final    BOTTLES DRAWN AEROBIC AND ANAEROBIC Blood Culture adequate volume Performed at [facility] [address], [place], [place] [address]    Culture  Setup Time   Final    IN BOTH AEROBIC AND ANAEROBIC BOTTLES GRAM POSITIVE COCCI IN CHAINS CRITICAL VALUE NOTED.  VALUE IS CONSISTENT WITH PREVIOUSLY REPORTED AND CALLED VALUE.    Culture (A)  Final    VIRIDANS STREPTOCOCCUS SUSCEPTIBILITIES PERFORMED ON PREVIOUS CULTURE WITHIN THE LAST 5 DAYS. Performed at [facility], [place] [address], [place], [place] [address]    Report Status 01/04/2018 FINAL  Final  Culture, blood (Routine x 2)     Status:  Abnormal   Collection Time: 01/01/18  8:30 PM  Result Value Ref Range Status   Specimen Description   Final    BLOOD RIGHT ANTECUBITAL Performed at [facility] [address], [place], [place] [address]    Special Requests   Final    BOTTLES DRAWN AEROBIC AND ANAEROBIC Blood Culture adequate volume Performed at [facility] [address], [place], [place] [address]    Culture  Setup Time   Final    GRAM POSITIVE COCCI IN CHAINS IN BOTH AEROBIC AND ANAEROBIC BOTTLES CRITICAL RESULT CALLED TO, READ BACK BY AND VERIFIED WITH: J.GRIMSLEY,PHARMD 0130 01/03/18 M.CAMPBELL Performed at [facility], [place] [address], [place], [place] [address]    Culture VIRIDANS STREPTOCOCCUS (A)  Final   Report Status 01/04/2018 FINAL  Final   Organism ID, Bacteria VIRIDANS STREPTOCOCCUS  Final      Susceptibility   Viridans streptococcus - MIC*    PENICILLIN <=0.06 SENSITIVE Sensitive     CEFTRIAXONE <=0.12 SENSITIVE Sensitive     ERYTHROMYCIN <=0.12 SENSITIVE Sensitive     LEVOFLOXACIN 1 SENSITIVE Sensitive     VANCOMYCIN 1 SENSITIVE Sensitive     * VIRIDANS STREPTOCOCCUS  Blood Culture ID Panel (Reflexed)     Status: Abnormal   Collection Time: 01/01/18  8:30 PM  Result Value Ref Range Status   Enterococcus species NOT DETECTED NOT DETECTED Final   Listeria monocytogenes NOT DETECTED NOT DETECTED Final   Staphylococcus species NOT DETECTED NOT DETECTED Final   Staphylococcus aureus NOT DETECTED NOT DETECTED Final   Streptococcus species DETECTED (A) NOT DETECTED Final    Comment: Not Enterococcus species, Streptococcus agalactiae, Streptococcus pyogenes, or Streptococcus pneumoniae. CRITICAL RESULT CALLED TO, READ BACK BY AND VERIFIED WITH: J.GRIMSLEY,PHARMD 01/03/18 M.CAMPBELL    Streptococcus agalactiae NOT DETECTED NOT DETECTED Final   Streptococcus pneumoniae NOT DETECTED NOT DETECTED Final   Streptococcus pyogenes NOT DETECTED NOT DETECTED  Final   Acinetobacter baumannii NOT DETECTED NOT DETECTED Final   Enterobacteriaceae species NOT DETECTED NOT DETECTED Final   Enterobacter cloacae complex NOT DETECTED NOT DETECTED Final   Escherichia coli NOT DETECTED NOT DETECTED Final   Klebsiella oxytoca NOT DETECTED NOT DETECTED Final   Klebsiella pneumoniae NOT DETECTED NOT DETECTED Final   Proteus species NOT DETECTED NOT DETECTED Final   Serratia marcescens NOT DETECTED NOT DETECTED Final   Haemophilus influenzae NOT DETECTED NOT DETECTED Final   Neisseria meningitidis NOT DETECTED NOT DETECTED Final   Pseudomonas aeruginosa NOT DETECTED NOT DETECTED Final   Candida albicans NOT DETECTED NOT DETECTED Final   Candida glabrata NOT  DETECTED NOT DETECTED Final   Candida krusei NOT DETECTED NOT DETECTED Final   Candida parapsilosis NOT DETECTED NOT DETECTED Final   Candida tropicalis NOT DETECTED NOT DETECTED Final    Comment: Performed at [facility], [place] [address] [place], [place] [address]  Urine culture     Status: None   Collection Time: 01/01/18 10:40 PM  Result Value Ref Range Status   Specimen Description   Final    URINE, CLEAN CATCH Performed at [facility], [place] [address], [place], [place] [address]    Special Requests   Final    Normal Performed at [facility], [place] [address] [place], [place] [address]    Culture   Final    NO GROWTH Performed at [facility], [place] [address], [place], [place] [address]    Report Status 01/03/2018 FINAL  Final  Culture, blood (routine x 2)     Status: None (Preliminary result)   Collection Time: 01/03/18  8:07 PM  Result Value Ref Range Status   Specimen Description   Final    BLOOD LEFT ANTECUBITAL Performed at [facility] [address], [place], [place] [address]    Special Requests   Final    BOTTLES DRAWN AEROBIC AND ANAEROBIC Blood Culture adequate volume Performed at [facility] [address] [place], [place] [address]    Culture   Final    NO GROWTH 2 DAYS Performed at [facility] [address], [place], [place] [address]    Report Status PENDING  Incomplete  Culture, blood (routine x 2)     Status: None (Preliminary result)   Collection Time: 01/03/18  8:07 PM  Result Value Ref Range Status   Specimen Description   Final    BLOOD RIGHT ANTECUBITAL Performed at [facility] [address] [place], [place] [address]    Special Requests   Final    BOTTLES DRAWN AEROBIC ONLY Blood Culture adequate volume Performed at [facility] [address] [place], [place] [address]    Culture   Final    NO GROWTH 2 DAYS Performed at [facility] [address], [place],  [address]    Report Status PENDING  Incomplete    Studies/Results: No results found.   Assessment/Plan: MV IE Viridans strep bacteremia Hx of MV regurg  Total days of antibiotics:5 (ancef --> ceftriaxone)  Continue ceftriaxone 2 g qday Repeat BCxsent 7-7, ngtd Will order [facility] Will need 6 weeks of ceftriaxone Appreciate Dr Lonia Skinner f/u.  Glad to see in f/u 6-7 weeks CV f/u for MV fxn Needs repeat BCx 2 weeks after completion of IV anbx   Allergies  Allergen Reactions  . Sulfa Antibiotics Other (See Comments)    Severe lethargy  . Ace Inhibitors Cough  . Antihistamines, Chlorpheniramine-Type Other (See Comments)    Trouble urinating. Patient st "all antihistamines" cause this.     OPAT Orders Discharge antibiotics: ceftriaxone 2g qday Duration: 42 days End Date: Feb 13, 2018  [facility] Care Per Protocol: please  Labs weekly while on IV antibiotics: _x_ CBC with differential __ BMP __ CMP _x_ CRP __ ESR __ Vancomycin trough  _x_ Please pull PIC at completion of IV antibiotics __ Please leave PIC in place until doctor has seen patient or been notified  Fax weekly labs to [phone]  Clinic Follow Up Appt: ID 6-7 weeks  Bobby Rumpf MD, FACP Infectious Diseases (pager) [phone] [email or web address] 01/06/2018, 4:17 PM  LOS: 5 days

## 2018-01-06 NOTE — Progress Notes (Signed)
PROGRESS NOTE    Chase Smith.   MWN:[id number]  DOB: [birth year]  DOA: 01/01/2018 PCP: Josetta Huddle, MD   Brief Narrative:  Chase Smith [phone] y.o.malewithhistory of mitral valve prolapse, hypertension, atrial fibrillation has been experiencing subjective feeling of fever and chills over the last 10 days. Over the last 10 days he has been having subjective feeling of fever,chills,flulike symptoms with mild headache. Threedays ago he went to his primary care physician and had blood work done and was told yesterday evening that his blood cultures grew gram-positive cocci in 4 out of 4 bottles.  He was admitted for bacteremia.   Subjective: He feels well. No complaints. ROS: no complaints of nausea, vomiting, constipation diarrhea, cough, dyspnea or dysuria. No other complaints.   Assessment & Plan:   Principal Problem:   Bacteremia, Strep Viridans - TEE > mitral valve is significantly thickened and there is a linear oscillating density that could be a ruptured cord but could also be a vegetation. - Cardiology recommends treatment for SBE - ID assisting - on Rocephin - if blood cultures from 7/7 neg later today, will order PICC Line   Active Problems:   MVP (mitral valve prolapse) - noted on TEE    Benign essential HTN - Coreg, Losartan    PAF (paroxysmal atrial fibrillation)  - Eliquis and Coreg   HLD - Crestor   DVT prophylaxis: Eliquis Code Status: Full code Family Communication:  Disposition Plan: home tomorrow Consultants:   ID Cardiology  Procedures:   TTE - Normal LV function; biatrial enlargement; sclerotic aortic valve   with mild AI; severe prolapse of posterior MV leaflet; linear   oscillating density noted (possible rupture chord; cannot R/O   small vegetation); severe, eccentric MR; mild TR and PI.  TEE   Antimicrobials:  Anti-infectives (From admission, onward)   Start     Dose/Rate Route Frequency Ordered Stop   01/04/18  1700  cefTRIAXone (ROCEPHIN) 2 g in sodium chloride 0.9 % 100 mL IVPB     2 g 200 mL/hr over 30 Minutes Intravenous Daily 01/04/18 1623     01/02/18 2300  vancomycin (VANCOCIN) 1,750 mg in sodium chloride 0.9 % 500 mL IVPB  Status:  Discontinued     1,750 mg 250 mL/hr over 120 Minutes Intravenous Every 24 hours 01/02/18 0641 01/02/18 1535   01/02/18 1600  ceFAZolin (ANCEF) IVPB 2g/100 mL premix  Status:  Discontinued     2 g 200 mL/hr over 30 Minutes Intravenous Every 8 hours 01/02/18 1536 01/04/18 1623   01/02/18 0800  doxycycline (VIBRAMYCIN) 100 mg in sodium chloride 0.9 % 250 mL IVPB  Status:  Discontinued     100 mg 125 mL/hr over 120 Minutes Intravenous Every 12 hours 01/02/18 0603 01/02/18 1535   01/01/18 2300  vancomycin (VANCOCIN) 2,000 mg in sodium chloride 0.9 % 500 mL IVPB     2,000 mg 250 mL/hr over 120 Minutes Intravenous  Once 01/01/18 2252 01/02/18 0351   01/01/18 2300  doxycycline (VIBRA-TABS) tablet 100 mg     100 mg Oral  Once 01/01/18 2258 01/02/18 0008   01/01/18 2245  piperacillin-tazobactam (ZOSYN) IVPB 3.375 g     3.375 g 100 mL/hr over 30 Minutes Intravenous  Once 01/01/18 2240 01/01/18 2338   01/01/18 2245  vancomycin (VANCOCIN) IVPB 1000 mg/200 mL premix  Status:  Discontinued     1,000 mg 200 mL/hr over 60 Minutes Intravenous  Once 01/01/18 2240 01/01/18 2251  Objective: Vitals:   01/05/18 1200 01/05/18 1246 01/05/18 2039 01/06/18 0557  BP: 119/71 131/83 125/77 102/75  Pulse: (!) 53 (!) 55 (!) 58 (!) 54  Resp: 13 16 16    Temp:  97.6 F (36.4 C) 98.6 F (37 C) 98.3 F (36.8 C)  TempSrc:  Oral Oral Oral  SpO2: 95% 99% 97% 97%  Weight:      Height:        Intake/Output Summary (Last 24 hours) at 01/06/2018 1244 Last data filed at 01/06/2018 1002 Gross per 24 hour  Intake 1060 ml  Output -  Net 1060 ml   Filed Weights   01/01/18 2000 01/02/18 0059  Weight: 93.9 kg (207 lb) 93 kg (205 lb 0.4 oz)    Examination: General exam: Appears  comfortable  HEENT: PERRLA, oral mucosa moist, no sclera icterus or thrush Respiratory system: Clear to auscultation. Respiratory effort normal. Cardiovascular system: S1 & S2 heard, RRR.   Gastrointestinal system: Abdomen soft, non-tender, nondistended. Normal bowel sound. No organomegaly Central nervous system: Alert and oriented. No focal neurological deficits. Extremities: No cyanosis, clubbing or edema Skin: No rashes or ulcers Psychiatry:  Mood & affect appropriate.     Data Reviewed: I have personally reviewed following labs and imaging studies  CBC: Recent Labs  Lab 01/01/18 2028 01/02/18 0408 01/03/18 0437 01/04/18 0511 01/05/18 0415 01/06/18 0344  WBC 8.3 6.5 6.2 6.4 6.6 6.6  NEUTROABS 6.2  --   --   --   --   --   HGB 13.8 12.5* 13.6 13.8 13.9 13.7  HCT 40.3 37.0* 40.4 41.2 41.4 41.0  MCV 89.8 90.2 91.0 91.2 90.8 91.1  PLT 266 207 258 282 309 740   Basic Metabolic Panel: Recent Labs  Lab 01/02/18 0408 01/03/18 0437 01/04/18 0511 01/05/18 0415 01/06/18 0344  NA 141 140 140 142 139  K 3.6 4.0 4.3 4.1 4.4  CL 109 108 107 107 108  CO2 25 25 26 26 24   GLUCOSE 97 95 97 94 101*  BUN 21 14 14 13 17   CREATININE 0.99 0.95 0.93 0.89 0.91  CALCIUM 8.1* 8.5* 8.5* 8.8* 8.7*   GFR: Estimated Creatinine Clearance: 82.9 mL/min (by C-G formula based on SCr of 0.91 mg/dL). Liver Function Tests: Recent Labs  Lab 01/01/18 2028 01/02/18 0408  AST 20 18  ALT 25 21  ALKPHOS 56 47  BILITOT 0.8 1.0  PROT 6.6 5.7*  ALBUMIN 3.7 3.1*   No results for input(s): LIPASE, AMYLASE in the last 168 hours. No results for input(s): AMMONIA in the last 168 hours. Coagulation Profile: Recent Labs  Lab 01/01/18 2028 01/05/18 0415  INR 1.21 1.16   Cardiac Enzymes: No results for input(s): CKTOTAL, CKMB, CKMBINDEX, TROPONINI in the last 168 hours. BNP (last 3 results) No results for input(s): PROBNP in the last 8760 hours. HbA1C: No results for input(s): HGBA1C in the last  72 hours. CBG: No results for input(s): GLUCAP in the last 168 hours. Lipid Profile: No results for input(s): CHOL, HDL, LDLCALC, TRIG, CHOLHDL, LDLDIRECT in the last 72 hours. Thyroid Function Tests: No results for input(s): TSH, T4TOTAL, FREET4, T3FREE, THYROIDAB in the last 72 hours. Anemia Panel: No results for input(s): VITAMINB12, FOLATE, FERRITIN, TIBC, IRON, RETICCTPCT in the last 72 hours. Urine analysis:    Component Value Date/Time   COLORURINE YELLOW 01/01/2018 2243   APPEARANCEUR CLEAR 01/01/2018 2243   LABSPEC 1.026 01/01/2018 2243   PHURINE 5.0 01/01/2018 2243   GLUCOSEU NEGATIVE 01/01/2018 2243  HGBUR NEGATIVE 01/01/2018 2243   BILIRUBINUR NEGATIVE 01/01/2018 2243   KETONESUR 5 (A) 01/01/2018 2243   PROTEINUR NEGATIVE 01/01/2018 2243   UROBILINOGEN 1.0 07/10/2012 1636   NITRITE NEGATIVE 01/01/2018 2243   LEUKOCYTESUR NEGATIVE 01/01/2018 2243   Sepsis Labs: @LABRCNTIP (procalcitonin:4,lacticidven:4) ) Recent Results (from the past 240 hour(s))  Culture, blood (Routine x 2)     Status: Abnormal   Collection Time: 01/01/18  8:29 PM  Result Value Ref Range Status   Specimen Description   Final    BLOOD LEFT ANTECUBITAL Performed at [facility], [place] [address], [place], [place] [address]    Special Requests   Final    BOTTLES DRAWN AEROBIC AND ANAEROBIC Blood Culture adequate volume Performed at [facility] [address] [place], [place] [address]    Culture  Setup Time   Final    IN BOTH AEROBIC AND ANAEROBIC BOTTLES GRAM POSITIVE COCCI IN CHAINS CRITICAL VALUE NOTED.  VALUE IS CONSISTENT WITH PREVIOUSLY REPORTED AND CALLED VALUE.    Culture (A)  Final    VIRIDANS STREPTOCOCCUS SUSCEPTIBILITIES PERFORMED ON PREVIOUS CULTURE WITHIN THE LAST 5 DAYS. Performed at [facility], [place] [address], [place], [place] [address]    Report Status 01/04/2018 FINAL  Final  Culture, blood (Routine x 2)     Status: Abnormal    Collection Time: 01/01/18  8:30 PM  Result Value Ref Range Status   Specimen Description   Final    BLOOD RIGHT ANTECUBITAL Performed at [facility] [address], [place], [place] [address]    Special Requests   Final    BOTTLES DRAWN AEROBIC AND ANAEROBIC Blood Culture adequate volume Performed at [facility] [address], [place], [place] [address]    Culture  Setup Time   Final    GRAM POSITIVE COCCI IN CHAINS IN BOTH AEROBIC AND ANAEROBIC BOTTLES CRITICAL RESULT CALLED TO, READ BACK BY AND VERIFIED WITH: J.GRIMSLEY,PHARMD 0130 01/03/18 M.CAMPBELL Performed at [facility], [place] [address], [place], [place] [address]    Culture VIRIDANS STREPTOCOCCUS (A)  Final   Report Status 01/04/2018 FINAL  Final   Organism ID, Bacteria VIRIDANS STREPTOCOCCUS  Final      Susceptibility   Viridans streptococcus - MIC*    PENICILLIN <=0.06 SENSITIVE Sensitive     CEFTRIAXONE <=0.12 SENSITIVE Sensitive     ERYTHROMYCIN <=0.12 SENSITIVE Sensitive     LEVOFLOXACIN 1 SENSITIVE Sensitive     VANCOMYCIN 1 SENSITIVE Sensitive     * VIRIDANS STREPTOCOCCUS  Blood Culture ID Panel (Reflexed)     Status: Abnormal   Collection Time: 01/01/18  8:30 PM  Result Value Ref Range Status   Enterococcus species NOT DETECTED NOT DETECTED Final   Listeria monocytogenes NOT DETECTED NOT DETECTED Final   Staphylococcus species NOT DETECTED NOT DETECTED Final   Staphylococcus aureus NOT DETECTED NOT DETECTED Final   Streptococcus species DETECTED (A) NOT DETECTED Final    Comment: Not Enterococcus species, Streptococcus agalactiae, Streptococcus pyogenes, or Streptococcus pneumoniae. CRITICAL RESULT CALLED TO, READ BACK BY AND VERIFIED WITH: J.GRIMSLEY,PHARMD 01/03/18 M.CAMPBELL    Streptococcus agalactiae NOT DETECTED NOT DETECTED Final   Streptococcus pneumoniae NOT DETECTED NOT DETECTED Final   Streptococcus pyogenes NOT DETECTED NOT DETECTED Final    Acinetobacter baumannii NOT DETECTED NOT DETECTED Final   Enterobacteriaceae species NOT DETECTED NOT DETECTED Final   Enterobacter cloacae complex NOT DETECTED NOT DETECTED Final   Escherichia coli  NOT DETECTED NOT DETECTED Final   Klebsiella oxytoca NOT DETECTED NOT DETECTED Final   Klebsiella pneumoniae NOT DETECTED NOT DETECTED Final   Proteus species NOT DETECTED NOT DETECTED Final   Serratia marcescens NOT DETECTED NOT DETECTED Final   Haemophilus influenzae NOT DETECTED NOT DETECTED Final   Neisseria meningitidis NOT DETECTED NOT DETECTED Final   Pseudomonas aeruginosa NOT DETECTED NOT DETECTED Final   Candida albicans NOT DETECTED NOT DETECTED Final   Candida glabrata NOT DETECTED NOT DETECTED Final   Candida krusei NOT DETECTED NOT DETECTED Final   Candida parapsilosis NOT DETECTED NOT DETECTED Final   Candida tropicalis NOT DETECTED NOT DETECTED Final    Comment: Performed at [facility], [place] [address] [place], [place] [address]  Urine culture     Status: None   Collection Time: 01/01/18 10:40 PM  Result Value Ref Range Status   Specimen Description   Final    URINE, CLEAN CATCH Performed at [facility], [place] [address], [place], [place] [address]    Special Requests   Final    Normal Performed at [facility], [place] [address] [place], [place] [address]    Culture   Final    NO GROWTH Performed at [facility], [place] [address], [place], [place] [address]    Report Status 01/03/2018 FINAL  Final  Culture, blood (routine x 2)     Status: None (Preliminary result)   Collection Time: 01/03/18  8:07 PM  Result Value Ref Range Status   Specimen Description   Final    BLOOD LEFT ANTECUBITAL Performed at [facility] [address] [place], [place] [address]    Special Requests   Final    BOTTLES DRAWN AEROBIC AND ANAEROBIC Blood Culture adequate volume Performed at [facility] [address], [place], [place] [address]    Culture   Final    NO GROWTH 1 DAY Performed at [facility], [place] [address], [place], [place] [address]    Report Status PENDING  Incomplete  Culture, blood (routine x 2)     Status: None (Preliminary result)   Collection Time: 01/03/18  8:07 PM  Result Value Ref Range Status   Specimen Description   Final    BLOOD RIGHT ANTECUBITAL Performed at [facility] [address], [place], [place] [address]    Special Requests   Final    BOTTLES DRAWN AEROBIC ONLY Blood Culture adequate volume Performed at [facility] [address], [place], [place] [address]    Culture   Final    NO GROWTH 1 DAY Performed at [facility], [place] [address], [place], [place] [address]    Report Status PENDING  Incomplete         Radiology Studies: No results found.    Scheduled Meds: . apixaban  5 mg Oral BID  . carvedilol  3.125 mg Oral BID  . feeding supplement (ENSURE ENLIVE)  237 mL Oral BID BM  . losartan  50 mg Oral QHS  . rosuvastatin  5 mg Oral Once per day on Mon Wed Fri   Continuous Infusions: . sodium chloride Stopped (01/02/18 0351)  . cefTRIAXone (ROCEPHIN)  IV Stopped (01/05/18 1747)     LOS: 5 days    Time spent in minutes: 35    Debbe Odea, MD [facility] Pager: [email or web address] Password [email or web address] 01/06/2018, 12:44 PM

## 2018-01-06 NOTE — Progress Notes (Signed)
Peripherally Inserted Central Catheter/Midline Placement  The IV Nurse has discussed with the patient and/or persons authorized to consent for the patient, the purpose of this procedure and the potential benefits and risks involved with this procedure.  The benefits include less needle sticks, lab draws from the catheter, and the patient may be discharged home with the catheter. Risks include, but not limited to, infection, bleeding, blood clot (thrombus formation), and puncture of an artery; nerve damage and irregular heartbeat and possibility to perform a PICC exchange if needed/ordered by physician.  Alternatives to this procedure were also discussed.  Bard Power PICC patient education guide, fact sheet on infection prevention and patient information card has been provided to patient /or left at bedside.    PICC/Midline Placement Documentation  PICC Single Lumen 56/81/27 PICC Right Basilic 40 cm 0 cm (Active)  Indication for Insertion or Continuance of Line Home intravenous therapies (PICC only) 01/06/2018  6:47 PM  Exposed Catheter (cm) 0 cm 01/06/2018  6:47 PM  Site Assessment Clean;Dry;Intact 01/06/2018  6:47 PM  Line Status Flushed;Saline locked;Blood return noted 01/06/2018  6:47 PM  Dressing Type Transparent 01/06/2018  6:47 PM  Dressing Status Clean;Dry;Intact;Antimicrobial disc in place 01/06/2018  6:47 PM  Dressing Change Due 01/13/18 01/06/2018  6:47 PM       [facility] Payment 01/06/2018, 6:48 PM

## 2018-01-06 NOTE — Anesthesia Postprocedure Evaluation (Addendum)
Anesthesia Post Note  Patient: Chase Smith.  Procedure(s) Performed: TRANSESOPHAGEAL ECHOCARDIOGRAM (TEE) (N/A )     Patient location during evaluation: Endoscopy Anesthesia Type: MAC Level of consciousness: awake Pain management: pain level controlled Vital Signs Assessment: post-procedure vital signs reviewed and stable Respiratory status: spontaneous breathing Cardiovascular status: stable Postop Assessment: no apparent nausea or vomiting Anesthetic complications: no    Last Vitals:  Vitals:   01/05/18 2039 01/06/18 0557  BP: 125/77 102/75  Pulse: (!) 58 (!) 54  Resp: 16   Temp: 37 C 36.8 C  SpO2: 97% 97%    Last Pain:  Vitals:   01/06/18 0557  TempSrc: Oral  PainSc:    Pain Goal: Patients Stated Pain Goal: 2 (01/02/18 1958)               Baxter

## 2018-01-06 NOTE — Progress Notes (Signed)
As outlined in TEE report mitral valve is significantly thickened and there is a linear oscillating density that could be a ruptured cord but could also be a vegetation.  Would likely treat as if patient has SBE.  Patient will likely need formal cardiology consultation given severity of mitral regurgitation.  Kirk Ruths, MD

## 2018-01-07 DIAGNOSIS — A491 Streptococcal infection, unspecified site: Secondary | ICD-10-CM

## 2018-01-07 DIAGNOSIS — I340 Nonrheumatic mitral (valve) insufficiency: Secondary | ICD-10-CM

## 2018-01-07 MED ORDER — HEPARIN SOD (PORK) LOCK FLUSH 100 UNIT/ML IV SOLN
500.0000 [IU] | Freq: Once | INTRAVENOUS | Status: AC
  Administered 2018-01-07: 500 [IU] via INTRAVENOUS

## 2018-01-07 MED ORDER — CEFTRIAXONE IV (FOR PTA / DISCHARGE USE ONLY): 2.0000 g | INTRAVENOUS | 0 refills | Status: AC

## 2018-01-07 MED ORDER — ACETAMINOPHEN 325 MG PO TABS: 650.0000 mg | ORAL_TABLET | Freq: Four times a day (QID) | ORAL | Status: DC | PRN

## 2018-01-07 NOTE — Discharge Summary (Signed)
Physician Discharge Summary  Chase Smith. VQX:[id number] DOB: 1946/05/11 DOA: 01/01/2018  PCP: Chase Huddle, MD  Admit date: 01/01/2018 Discharge date: 01/07/2018  Admitted From: home Disposition:  home   Recommendations for Outpatient Follow-up:  1. Home health to manage antibiotics  Home Health:  ordered  Equipment/Devices:  PICC    Discharge Condition:  stable   CODE STATUS:  Full code   Consultations:  ID    Discharge Diagnoses:  Principal Problem:   Streptococcus viridans bacteremia Active Problems:   Meckel's diverticulitis   MVP (mitral valve prolapse) with severe Mitral regurgitation   Benign essential HTN   PAF (paroxysmal atrial fibrillation) (HCC)     HPI: Chase Smith y.o.malewithhistory of mitral valve prolapse, hypertension, atrial fibrillation has been experiencing subjective feeling of fever and chills over the last 10 days. Over the last 10 days he has been having subjective feeling of fever,chills,flulike symptoms with mild headache. Threedays ago he went to his primary care physician and had blood work done and was told yesterday evening that his blood cultures grew gram-positive cocci in 4 out of 4 bottles. He was thus admitted for IV antibiotics.  Hospital Course:  Principal Problem:   Bacteremia, Strep Viridans - TTE> Mildly thickened leaflets, particularly at the tips. Unclear if   there is a vegetation on the atrial side of anterior leaflet tip   vs focal thickening.  - TEE > mitral valve is significantly thickened and there is a linear oscillating density that could be a ruptured cord but could also be a vegetation. - Cardiology recommends treatment for SBE - he does admit that he had a "bad tooth" in the past in the right lower jaw but this has not been bothering him lately. We have discussed going back to his regular dentist for x rays. As he is at [facility], he does not want to be charged for transportation to [facility]  for an orthopantogram.  - ID assisting with management - he has been receiving Ceftriaxone 2 g m daily for treatment with a plan for total 42 days with end date on 02/13/18 - PICC Line placed on 7/10 and home health arranged - Weekly labs include: CBC with diff and CRP to be sent to Chase Smith who will f/u as outpt - patient and his wife are comfortable with this plan   Active Problems:   MVP (mitral valve prolapse) and mitral regurgitation  - noted on TEE    Benign essential HTN - Coreg, Losartan    PAF (paroxysmal atrial fibrillation)  - Eliquis and Coreg   HLD - Crestor    Consultants:   ID Cardiology  Procedures:   TTE - Normal LV function; biatrial enlargement; sclerotic aortic valve with mild AI; severe prolapse of posterior MV leaflet; linear oscillating density noted (possible rupture chord; cannot R/O small vegetation); severe, eccentric MR; mild TR and PI.  TEE       Discharge Exam: Vitals:   01/06/18 2145 01/07/18 0646  BP: 123/84 112/84  Pulse: (!) 57 (!) 54  Resp: 16 16  Temp: 98.9 F (37.2 C) 97.6 F (36.4 C)  SpO2: 96% 97%   Vitals:   01/06/18 0557 01/06/18 1417 01/06/18 2145 01/07/18 0646  BP: 102/75 107/71 123/84 112/84  Pulse: (!) 54 (!) 59 (!) 57 (!) 54  Resp:  20 16 16   Temp: 98.3 F (36.8 C) 97.7 F (36.5 C) 98.9 F (37.2 C) 97.6 F (36.4 C)  TempSrc:  Oral Oral Oral Oral  SpO2: 97% 99% 96% 97%  Weight:      Height:        General: Pt is alert, awake, not in acute distress Cardiovascular: RRR, S1/S2 +, no rubs, no gallops Respiratory: CTA bilaterally, no wheezing, no rhonchi Abdominal: Soft, NT, ND, bowel sounds + Extremities: no edema, no cyanosis   Discharge Instructions  Discharge Instructions    Diet - low sodium heart healthy   Complete by:  As directed    Home infusion instructions [facility] May follow [place] Dosing Protocol; May administer Cathflo as needed to maintain patency of  vascular access device.; Flushing of vascular access device: per [facility] Protocol: 0.9% NaCl pre/post medica...   Complete by:  As directed    Instructions:  May follow [place] Dosing Protocol   Instructions:  May administer Cathflo as needed to maintain patency of vascular access device.   Instructions:  Flushing of vascular access device: per [facility] Protocol: 0.9% NaCl pre/post medication administration and prn patency; Heparin 100 u/ml, 45m for implanted ports and Heparin 10u/ml, 562mfor all other central venous catheters.   Instructions:  May follow [facility] Anaphylaxis Protocol for First Dose Administration in the home: 0.9% NaCl at 25-50 ml/hr to maintain IV access for protocol meds. Epinephrine 0.3 ml IV/IM PRN and Benadryl 25-50 IV/IM PRN s/s of anaphylaxis.   Instructions:  AdProctornfusion Coordinator (RN) to assist per patient IV care needs in the home PRN.   Increase activity slowly   Complete by:  As directed      Allergies as of 01/07/2018      Reactions   Sulfa Antibiotics Other (See Comments)   Severe lethargy   Ace Inhibitors Cough   Antihistamines, Chlorpheniramine-type Other (See Comments)   Trouble urinating. Patient st "all antihistamines" cause this.       Medication List    TAKE these medications   acetaminophen 325 MG tablet Commonly known as:  TYLENOL Take 2 tablets (650 mg total) by mouth every 6 (six) hours as needed for mild pain (or Fever >/= 101).   carvedilol 3.125 MG tablet Commonly known as:  COREG Take 3.125 mg by mouth 2 (two) times daily.   cefTRIAXone IVPB Commonly known as:  ROCEPHIN Inject 2 g into the vein daily. Indication:  Endocarditis Last Day of Therapy:  August 17th, 2019 Labs - Once weekly:  CBC/D and BMP Labs - Every other week:  ESR and CRP   CO Q-10 PO Take 1 capsule by mouth daily.   ELIQUIS 5 MG Tabs tablet Generic drug:  apixaban Take 5 mg by mouth 2 (two) times daily.   flunisolide 25 MCG/ACT (0.025%) Soln Commonly  known as:  NASALIDE Place 2 sprays into the nose 2 (two) times daily as needed.   losartan 50 MG tablet Commonly known as:  COZAAR Take 50 mg by mouth daily.   rosuvastatin 5 MG tablet Commonly known as:  CRESTOR Take 5 mg by mouth 3 (three) times a week.   VITAMIN B12 PO Take 500 mg by mouth daily.   Vitamin D 2000 units tablet Take 2,000 Units daily by mouth.            Home Infusion Instuctions  (From admission, onward)        Start     Ordered   01/07/18 0000  Home infusion instructions [facility] May follow [place] Protocol; May administer Cathflo as needed to maintain  patency of vascular access device.; Flushing of vascular access device: per [facility] Protocol: 0.9% NaCl pre/post medica...    Question Answer Comment  Instructions May follow [place] Dosing Protocol   Instructions May administer Cathflo as needed to maintain patency of vascular access device.   Instructions Flushing of vascular access device: per [facility] Protocol: 0.9% NaCl pre/post medication administration and prn patency; Heparin 100 u/ml, 30m for implanted ports and Heparin 10u/ml, 534mfor all other central venous catheters.   Instructions May follow [facility] Anaphylaxis Protocol for First Dose Administration in the home: 0.9% NaCl at 25-50 ml/hr to maintain IV access for protocol meds. Epinephrine 0.3 ml IV/IM PRN and Benadryl 25-50 IV/IM PRN s/s of anaphylaxis.   Instructions [facility] Infusion Coordinator (RN) to assist per patient IV care needs in the home PRN.      01/07/18 0929     Follow-up Information    [facility], [facility] Follow up.   Specialty:  Ho[place]hy:  For home health infusion Contact information: [address] [place] [phone]      [facility] Follow up.   Specialty:  Home Health Services Why:  For home health nursing Contact information: [phone]        [facility] RichesMD Follow up.   Specialty:  Infectious Diseases Contact information: [phone]        GaJosetta HuddleMD. Schedule an appointment as soon as possible for a visit in 1 week(s).   Specialty:  Internal Medicine Contact information: [address] [place] [phone]        TuSueanne MargaritaMD .   Specialty:  Cardiology Contact information: [phone]. [address] [place] [phone]        Allergies  Allergen Reactions  . Sulfa Antibiotics Other (See Comments)    Severe lethargy  . Ace Inhibitors Cough  . Antihistamines, Chlorpheniramine-Type Other (See Comments)    Trouble urinating. Patient st "all antihistamines" cause this.      Procedures/Studies: PICC line  Dg Chest 2 View  Result Date: 01/01/2018 CLINICAL DATA:  Generalized flu-like symptoms for 2 weeks. History of hypertension. Nonsmoker. EXAM: CHEST - 2 VIEW COMPARISON:  08/03/2012 FINDINGS: The heart size and mediastinal contours are within normal limits. Both lungs are clear. The visualized skeletal structures are unremarkable. IMPRESSION: No active cardiopulmonary disease. Electronically Signed   By: WiLucienne Capers.D.   On: 01/01/2018 23:30   UsKoreakg Site Rite  Result Date: 01/06/2018 If Site Rite image not attached, placement could not be confirmed due to current cardiac rhythm.  Ct Maxillofacial Wo Contrast  Result Date: 12/21/2017 CLINICAL DATA:  Sinus pain. EXAM: CT MAXILLOFACIAL WITHOUT CONTRAST TECHNIQUE: Multidetector CT imaging of the maxillofacial structures was performed. Multiplanar CT image reconstructions were also generated. COMPARISON:  None. FINDINGS: Osseous: No fracture or mandibular dislocation. No destructive process. Orbits: Negative. No traumatic or inflammatory finding. Sinuses: Clear. Soft tissues: Negative. Limited  intracranial: No significant or unexpected finding. IMPRESSION: No abnormality seen in maxillofacial region. Electronically Signed   By: JaMarijo ConceptionM.D.   On: 12/21/2017 10:11     The results of significant diagnostics from this hospitalization (including imaging, microbiology, ancillary and laboratory) are listed below for reference.     Microbiology: Recent Results (from  the past 240 hour(s))  Culture, blood (Routine x 2)     Status: Abnormal   Collection Time: 01/01/18  8:29 PM  Result Value Ref Range Status   Specimen Description   Final    BLOOD LEFT ANTECUBITAL Performed at [facility] [address] [place], [place] [address]    Special Requests   Final    BOTTLES DRAWN AEROBIC AND ANAEROBIC Blood Culture adequate volume Performed at [facility] [address], [place], [place] [address]    Culture  Setup Time   Final    IN BOTH AEROBIC AND ANAEROBIC BOTTLES GRAM POSITIVE COCCI IN CHAINS CRITICAL VALUE NOTED.  VALUE IS CONSISTENT WITH PREVIOUSLY REPORTED AND CALLED VALUE.    Culture (A)  Final    VIRIDANS STREPTOCOCCUS SUSCEPTIBILITIES PERFORMED ON PREVIOUS CULTURE WITHIN THE LAST 5 DAYS. Performed at [facility], [place] [address] [place], [place] [address]    Report Status 01/04/2018 FINAL  Final  Culture, blood (Routine x 2)     Status: Abnormal   Collection Time: 01/01/18  8:30 PM  Result Value Ref Range Status   Specimen Description   Final    BLOOD RIGHT ANTECUBITAL Performed at [facility] [address], [place], [place] [address]    Special Requests   Final    BOTTLES DRAWN AEROBIC AND ANAEROBIC Blood Culture adequate volume Performed at [facility] [address], [place], [place] [address]    Culture  Setup Time   Final    GRAM POSITIVE COCCI IN CHAINS IN BOTH AEROBIC AND ANAEROBIC BOTTLES CRITICAL RESULT CALLED TO, READ BACK BY AND VERIFIED WITH:  J.GRIMSLEY,PHARMD 0130 01/03/18 M.CAMPBELL Performed at [facility], [place] [address] [place], [place] [address]    Culture VIRIDANS STREPTOCOCCUS (A)  Final   Report Status 01/04/2018 FINAL  Final   Organism ID, Bacteria VIRIDANS STREPTOCOCCUS  Final      Susceptibility   Viridans streptococcus - MIC*    PENICILLIN <=0.06 SENSITIVE Sensitive     CEFTRIAXONE <=0.12 SENSITIVE Sensitive     ERYTHROMYCIN <=0.12 SENSITIVE Sensitive     LEVOFLOXACIN 1 SENSITIVE Sensitive     VANCOMYCIN 1 SENSITIVE Sensitive     * VIRIDANS STREPTOCOCCUS  Blood Culture ID Panel (Reflexed)     Status: Abnormal   Collection Time: 01/01/18  8:30 PM  Result Value Ref Range Status   Enterococcus species NOT DETECTED NOT DETECTED Final   Listeria monocytogenes NOT DETECTED NOT DETECTED Final   Staphylococcus species NOT DETECTED NOT DETECTED Final   Staphylococcus aureus NOT DETECTED NOT DETECTED Final   Streptococcus species DETECTED (A) NOT DETECTED Final    Comment: Not Enterococcus species, Streptococcus agalactiae, Streptococcus pyogenes, or Streptococcus pneumoniae. CRITICAL RESULT CALLED TO, READ BACK BY AND VERIFIED WITH: J.GRIMSLEY,PHARMD 01/03/18 M.CAMPBELL    Streptococcus agalactiae NOT DETECTED NOT DETECTED Final   Streptococcus pneumoniae NOT DETECTED NOT DETECTED Final   Streptococcus pyogenes NOT DETECTED NOT DETECTED Final   Acinetobacter baumannii NOT DETECTED NOT DETECTED Final   Enterobacteriaceae species NOT DETECTED NOT DETECTED Final   Enterobacter cloacae complex NOT DETECTED NOT DETECTED Final   Escherichia coli NOT DETECTED NOT DETECTED Final   Klebsiella oxytoca NOT DETECTED NOT DETECTED Final   Klebsiella pneumoniae NOT DETECTED NOT DETECTED Final   Proteus species NOT DETECTED NOT DETECTED Final   Serratia marcescens NOT DETECTED NOT DETECTED Final   Haemophilus influenzae NOT DETECTED NOT DETECTED Final  Neisseria meningitidis NOT DETECTED NOT DETECTED Final    Pseudomonas aeruginosa NOT DETECTED NOT DETECTED Final   Candida albicans NOT DETECTED NOT DETECTED Final   Candida glabrata NOT DETECTED NOT DETECTED Final   Candida krusei NOT DETECTED NOT DETECTED Final   Candida parapsilosis NOT DETECTED NOT DETECTED Final   Candida tropicalis NOT DETECTED NOT DETECTED Final    Comment: Performed at [facility], [place] [address], [place], [place] [address]  Urine culture     Status: None   Collection Time: 01/01/18 10:40 PM  Result Value Ref Range Status   Specimen Description   Final    URINE, CLEAN CATCH Performed at [facility], [place] [address], [place], [place] [address]    Special Requests   Final    Normal Performed at [facility], [place] [address] [place], [place] [address]    Culture   Final    NO GROWTH Performed at [facility], [place] [address], [place], [place] [address]    Report Status 01/03/2018 FINAL  Final  Culture, blood (routine x 2)     Status: None (Preliminary result)   Collection Time: 01/03/18  8:07 PM  Result Value Ref Range Status   Specimen Description   Final    BLOOD LEFT ANTECUBITAL Performed at [facility] [address], [place], [place] [address]    Special Requests   Final    BOTTLES DRAWN AEROBIC AND ANAEROBIC Blood Culture adequate volume Performed at [facility] [address], [place], [place] [address]    Culture   Final    NO GROWTH 2 DAYS Performed at [facility] [address] [place], [place] [address]    Report Status PENDING  Incomplete  Culture, blood (routine x 2)     Status: None (Preliminary result)   Collection Time: 01/03/18  8:07 PM  Result Value Ref Range Status   Specimen Description   Final    BLOOD RIGHT ANTECUBITAL Performed at [facility] [address], [place], [place] [address]    Special Requests   Final    BOTTLES DRAWN AEROBIC ONLY Blood Culture  adequate volume Performed at [facility] [address], [place], [place] [address]    Culture   Final    NO GROWTH 2 DAYS Performed at [facility] [address] [place], [place] [address]    Report Status PENDING  Incomplete     Labs: BNP (last 3 results) No results for input(s): BNP in the last 8760 hours. Basic Metabolic Panel: Recent Labs  Lab 01/02/18 0408 01/03/18 0437 01/04/18 0511 01/05/18 0415 01/06/18 0344  NA 141 140 140 142 139  K 3.6 4.0 4.3 4.1 4.4  CL 109 108 107 107 108  CO2 25 25 26 26 24   GLUCOSE 97 95 97 94 101*  BUN 21 14 14 13 17   CREATININE 0.99 0.95 0.93 0.89 0.91  CALCIUM 8.1* 8.5* 8.5* 8.8* 8.7*   Liver Function Tests: Recent Labs  Lab 01/01/18 2028 01/02/18 0408  AST 20 18  ALT 25 21  ALKPHOS 56 47  BILITOT 0.8 1.0  PROT 6.6 5.7*  ALBUMIN 3.7 3.1*   No results for input(s): LIPASE, AMYLASE in the last 168 hours. No results for input(s): AMMONIA in the last 168 hours. CBC: Recent Labs  Lab 01/01/18 2028 01/02/18 0408 01/03/18 7680 01/04/18 8811 01/05/18 0415 01/06/18 0344  WBC 8.3 6.5 6.2 6.4 6.6 6.6  NEUTROABS 6.2  --   --   --   --   --   HGB 13.8 12.5* 13.6 13.8 13.9 13.7  HCT 40.3 37.0* 40.4 41.2 41.4 41.0  MCV 89.8 90.2 91.0 91.2 90.8 91.1  PLT 266 207 258 282 309 277   Cardiac Enzymes: No results for input(s): CKTOTAL, CKMB, CKMBINDEX, TROPONINI in the last 168 hours. BNP: Invalid input(s): POCBNP CBG: No results for input(s): GLUCAP in the last 168 hours. D-Dimer No results for input(s): DDIMER in the last 72 hours. Hgb A1c No results for input(s): HGBA1C in the last 72 hours. Lipid Profile No results for input(s): CHOL, HDL, LDLCALC, TRIG, CHOLHDL, LDLDIRECT in the last 72 hours. Thyroid function studies No results for input(s): TSH, T4TOTAL, T3FREE, THYROIDAB in the last 72 hours.  Invalid input(s): FREET3 Anemia work up No results for input(s): VITAMINB12, FOLATE, FERRITIN,  TIBC, IRON, RETICCTPCT in the last 72 hours. Urinalysis    Component Value Date/Time   COLORURINE YELLOW 01/01/2018 2243   APPEARANCEUR CLEAR 01/01/2018 2243   LABSPEC 1.026 01/01/2018 2243   PHURINE 5.0 01/01/2018 2243   GLUCOSEU NEGATIVE 01/01/2018 2243   HGBUR NEGATIVE 01/01/2018 2243   BILIRUBINUR NEGATIVE 01/01/2018 2243   KETONESUR 5 (A) 01/01/2018 2243   PROTEINUR NEGATIVE 01/01/2018 2243   UROBILINOGEN 1.0 07/10/2012 1636   NITRITE NEGATIVE 01/01/2018 2243   LEUKOCYTESUR NEGATIVE 01/01/2018 2243   Sepsis Labs Invalid input(s): PROCALCITONIN,  WBC,  LACTICIDVEN Microbiology Recent Results (from the past 240 hour(s))  Culture, blood (Routine x 2)     Status: Abnormal   Collection Time: 01/01/18  8:29 PM  Result Value Ref Range Status   Specimen Description   Final    BLOOD LEFT ANTECUBITAL Performed at [facility], [place] [address], [place], [place] [address]    Special Requests   Final    BOTTLES DRAWN AEROBIC AND ANAEROBIC Blood Culture adequate volume Performed at [facility] [address] [place], [place] [address]    Culture  Setup Time   Final    IN BOTH AEROBIC AND ANAEROBIC BOTTLES GRAM POSITIVE COCCI IN CHAINS CRITICAL VALUE NOTED.  VALUE IS CONSISTENT WITH PREVIOUSLY REPORTED AND CALLED VALUE.    Culture (A)  Final    VIRIDANS STREPTOCOCCUS SUSCEPTIBILITIES PERFORMED ON PREVIOUS CULTURE WITHIN THE LAST 5 DAYS. Performed at [facility], [place] [address], [place], [place] [address]    Report Status 01/04/2018 FINAL  Final  Culture, blood (Routine x 2)     Status: Abnormal   Collection Time: 01/01/18  8:30 PM  Result Value Ref Range Status   Specimen Description   Final    BLOOD RIGHT ANTECUBITAL Performed at [facility] [address], [place], [place] [address]    Special Requests   Final    BOTTLES DRAWN AEROBIC AND ANAEROBIC Blood Culture adequate volume Performed at [facility] [address] [place], [place] [address]    Culture  Setup Time   Final    GRAM POSITIVE COCCI IN CHAINS IN BOTH AEROBIC AND ANAEROBIC BOTTLES CRITICAL RESULT CALLED TO, READ BACK BY AND VERIFIED WITH: J.GRIMSLEY,PHARMD 0130 01/03/18 M.CAMPBELL Performed at [facility], [place] [address] [place], [place] [address]    Culture VIRIDANS STREPTOCOCCUS (A)  Final   Report Status 01/04/2018 FINAL  Final   Organism ID, Bacteria VIRIDANS STREPTOCOCCUS  Final  Susceptibility   Viridans streptococcus - MIC*    PENICILLIN <=0.06 SENSITIVE Sensitive     CEFTRIAXONE <=0.12 SENSITIVE Sensitive     ERYTHROMYCIN <=0.12 SENSITIVE Sensitive     LEVOFLOXACIN 1 SENSITIVE Sensitive     VANCOMYCIN 1 SENSITIVE Sensitive     * VIRIDANS STREPTOCOCCUS  Blood Culture ID Panel (Reflexed)     Status: Abnormal   Collection Time: 01/01/18  8:30 PM  Result Value Ref Range Status   Enterococcus species NOT DETECTED NOT DETECTED Final   Listeria monocytogenes NOT DETECTED NOT DETECTED Final   Staphylococcus species NOT DETECTED NOT DETECTED Final   Staphylococcus aureus NOT DETECTED NOT DETECTED Final   Streptococcus species DETECTED (A) NOT DETECTED Final    Comment: Not Enterococcus species, Streptococcus agalactiae, Streptococcus pyogenes, or Streptococcus pneumoniae. CRITICAL RESULT CALLED TO, READ BACK BY AND VERIFIED WITH: J.GRIMSLEY,PHARMD 01/03/18 M.CAMPBELL    Streptococcus agalactiae NOT DETECTED NOT DETECTED Final   Streptococcus pneumoniae NOT DETECTED NOT DETECTED Final   Streptococcus pyogenes NOT DETECTED NOT DETECTED Final   Acinetobacter baumannii NOT DETECTED NOT DETECTED Final   Enterobacteriaceae species NOT DETECTED NOT DETECTED Final   Enterobacter cloacae complex NOT DETECTED NOT DETECTED Final   Escherichia coli NOT DETECTED NOT DETECTED Final   Klebsiella oxytoca NOT DETECTED NOT DETECTED Final   Klebsiella pneumoniae NOT DETECTED NOT DETECTED Final    Proteus species NOT DETECTED NOT DETECTED Final   Serratia marcescens NOT DETECTED NOT DETECTED Final   Haemophilus influenzae NOT DETECTED NOT DETECTED Final   Neisseria meningitidis NOT DETECTED NOT DETECTED Final   Pseudomonas aeruginosa NOT DETECTED NOT DETECTED Final   Candida albicans NOT DETECTED NOT DETECTED Final   Candida glabrata NOT DETECTED NOT DETECTED Final   Candida krusei NOT DETECTED NOT DETECTED Final   Candida parapsilosis NOT DETECTED NOT DETECTED Final   Candida tropicalis NOT DETECTED NOT DETECTED Final    Comment: Performed at [facility], [place] [address], [place], [place] [address]  Urine culture     Status: None   Collection Time: 01/01/18 10:40 PM  Result Value Ref Range Status   Specimen Description   Final    URINE, CLEAN CATCH Performed at [facility], [place] [address], [place], [place] [address]    Special Requests   Final    Normal Performed at [facility], [place] [address], [place], [place] [address]    Culture   Final    NO GROWTH Performed at [facility], [place] [address], [place], [place] [address]    Report Status 01/03/2018 FINAL  Final  Culture, blood (routine x 2)     Status: None (Preliminary result)   Collection Time: 01/03/18  8:07 PM  Result Value Ref Range Status   Specimen Description   Final    BLOOD LEFT ANTECUBITAL Performed at [facility] [address], [place], [place] [address]    Special Requests   Final    BOTTLES DRAWN AEROBIC AND ANAEROBIC Blood Culture adequate volume Performed at [facility] [address], [place], [place] [address]    Culture   Final    NO GROWTH 2 DAYS Performed at [facility] [address] [place], [place] [address]    Report Status PENDING  Incomplete  Culture, blood (routine x 2)     Status: None (Preliminary result)   Collection Time: 01/03/18  8:07 PM  Result Value Ref Range Status  Specimen Description   Final    BLOOD RIGHT ANTECUBITAL Performed at [facility] [address] [place], [place] [address]    Special Requests   Final    BOTTLES DRAWN AEROBIC ONLY Blood Culture adequate volume Performed at [facility] [address] [place],  [address]    Culture   Final    NO GROWTH 2 DAYS Performed at [facility] [address] [place],  [address]    Report Status PENDING  Incomplete     Time coordinating discharge in minutes: 65  SIGNED:   Debbe Odea, MD  [facility] 01/07/2018, 9:33 AM Pager   If 7PM-7AM, please contact night-coverage [email or web address] Password [email or web address]

## 2018-01-07 NOTE — Discharge Instructions (Signed)
PICC Home Guide °A peripherally inserted central catheter (PICC) is a long, thin, flexible tube that is inserted into a vein in the upper arm. It is a form of intravenous (IV) access. It is considered to be a "central" line because the tip of the PICC ends in a large vein in your chest. This large vein is called the superior vena cava (SVC). The PICC tip ends in the SVC because there is a lot of blood flow in the SVC. This allows medicines and IV fluids to be quickly distributed throughout the body. The PICC is inserted using a sterile technique by a specially trained nurse or physician. After the PICC is inserted, a chest X-ray exam is done to be sure it is in the correct place. °A PICC may be placed for different reasons, such as: °· To give medicines and liquid nutrition that can only be given through a central line. Examples are: °? Certain antibiotic treatments. °? Chemotherapy. °? Total parenteral nutrition (TPN). °· To take frequent blood samples. °· To give IV fluids and blood products. °· If there is difficulty placing a peripheral intravenous (PIV) catheter. ° °If taken care of properly, a PICC can remain in place for several months. A PICC can also allow a person to go home from the hospital early. Medicine and PICC care can be managed at home by a family member or home health care team. °What problems can happen when I have a PICC? °Problems with a PICC can occasionally occur. These may include the following: °· A blood clot (thrombus) forming in or at the tip of the PICC. This can cause the PICC to become clogged. A clot-dissolving medicine called tissue plasminogen activator (tPA) can be given through the PICC to help break up the clot. °· Inflammation of the vein (phlebitis) in which the PICC is placed. Signs of inflammation may include redness, pain at the insertion site, red streaks, or being able to feel a "cord" in the vein where the PICC is located. °· Infection in the PICC or at the insertion  site. Signs of infection may include fever, chills, redness, swelling, or pus drainage from the PICC insertion site. °· PICC movement (malposition). The PICC tip may move from its original position due to excessive physical activity, forceful coughing, sneezing, or vomiting. °· A break or cut in the PICC. It is important to not use scissors near the PICC. °· Nerve or tendon irritation or injury during PICC insertion. ° °What should I keep in mind about activities when I have a PICC? °· You may bend your arm and move it freely. If your PICC is near or at the bend of your elbow, avoid activity with repeated motion at the elbow. °· Rest at home for the remainder of the day following PICC line insertion. °· Avoid lifting heavy objects as instructed by your health care provider. °· Avoid using a crutch with the arm on the same side as your PICC. You may need to use a walker. °What should I know about my PICC dressing? °· Keep your PICC bandage (dressing) clean and dry to prevent infection. °? Ask your health care provider when you may shower. Ask your health care provider to teach you how to wrap the PICC when you do take a shower. °· Change the PICC dressing as instructed by your health care provider. °· Change your PICC dressing if it becomes loose or wet. °What should I know about PICC care? °· Check the PICC insertion   site daily for leakage, redness, swelling, or pain. °· Do not take a bath, swim, or use hot tubs when you have a PICC. Cover PICC line with clear plastic wrap and tape to keep it dry while showering. °· Flush the PICC as directed by your health care provider. Let your health care provider know right away if the PICC is difficult to flush or does not flush. Do not use force to flush the PICC. °· Do not use a syringe that is less than 10 mL to flush the PICC. °· Never pull or tug on the PICC. °· Avoid blood pressure checks on the arm with the PICC. °· Keep your PICC identification card with you at all  times. °· Do not take the PICC out yourself. Only a trained clinical professional should remove the PICC. °Get help right away if: °· Your PICC is accidentally pulled all the way out. If this happens, cover the insertion site with a bandage or gauze dressing. Do not throw the PICC away. Your health care provider will need to inspect it. °· Your PICC was tugged or pulled and has partially come out. Do not  push the PICC back in. °· There is any type of drainage, redness, or swelling where the PICC enters the skin. °· You cannot flush the PICC, it is difficult to flush, or the PICC leaks around the insertion site when it is flushed. °· You hear a "flushing" sound when the PICC is flushed. °· You have pain, discomfort, or numbness in your arm, shoulder, or jaw on the same side as the PICC. °· You feel your heart "racing" or skipping beats. °· You notice a hole or tear in the PICC. °· You develop chills or a fever. °This information is not intended to replace advice given to you by your health care provider. Make sure you discuss any questions you have with your health care provider. °Document Released: 12/21/2002 Document Revised: 01/04/2016 Document Reviewed: 04/08/2013 °Elsevier Interactive Patient Education © 2017 [employer] ° °

## 2018-01-07 NOTE — Progress Notes (Signed)
   01/07/18 1200  Clinical Encounter Type  Visited With Patient and family together  Visit Type Initial;Psychological support;Spiritual support  Referral From Nurse  Consult/Referral To Chaplain  Spiritual Encounters  Spiritual Needs Brochure;Emotional;Other (Comment) (Spiritual Care Conversation/Support)  Stress Factors  Patient Stress Factors Other (Comment) (Advance Directive )  Family Stress Factors Other (Comment)  Advance Directives (For Healthcare)  Does Patient Have a Medical Advance Directive? No  Would patient like information on creating a medical advance directive? Yes (Inpatient - patient requests chaplain consult to create a medical advance directive) (Does not want to complete at this time )   I visited with the patient per Spiritual Care consult. I provided the patient with a copy of the Advance Directive document.  The patient does not wish to complete at this time.   Please, contact Spiritual Care consult for further assistance.   Chaplain Shanon Ace M.Div., [facility]

## 2018-01-07 NOTE — Progress Notes (Signed)
Patient discharged to home with family, discharge instructions reviewed with patient who verbalized understanding. 

## 2018-01-08 DIAGNOSIS — I341 Nonrheumatic mitral (valve) prolapse: Secondary | ICD-10-CM | POA: Diagnosis not present

## 2018-01-08 DIAGNOSIS — A491 Streptococcal infection, unspecified site: Secondary | ICD-10-CM | POA: Diagnosis not present

## 2018-01-08 DIAGNOSIS — I5030 Unspecified diastolic (congestive) heart failure: Secondary | ICD-10-CM | POA: Diagnosis not present

## 2018-01-08 DIAGNOSIS — I110 Hypertensive heart disease with heart failure: Secondary | ICD-10-CM | POA: Diagnosis not present

## 2018-01-08 DIAGNOSIS — I340 Nonrheumatic mitral (valve) insufficiency: Secondary | ICD-10-CM | POA: Diagnosis not present

## 2018-01-08 DIAGNOSIS — I480 Paroxysmal atrial fibrillation: Secondary | ICD-10-CM | POA: Diagnosis not present

## 2018-01-09 DIAGNOSIS — I480 Paroxysmal atrial fibrillation: Secondary | ICD-10-CM | POA: Diagnosis not present

## 2018-01-09 DIAGNOSIS — I341 Nonrheumatic mitral (valve) prolapse: Secondary | ICD-10-CM | POA: Diagnosis not present

## 2018-01-09 DIAGNOSIS — I5030 Unspecified diastolic (congestive) heart failure: Secondary | ICD-10-CM | POA: Diagnosis not present

## 2018-01-09 DIAGNOSIS — A491 Streptococcal infection, unspecified site: Secondary | ICD-10-CM | POA: Diagnosis not present

## 2018-01-09 DIAGNOSIS — I340 Nonrheumatic mitral (valve) insufficiency: Secondary | ICD-10-CM | POA: Diagnosis not present

## 2018-01-09 DIAGNOSIS — I110 Hypertensive heart disease with heart failure: Secondary | ICD-10-CM | POA: Diagnosis not present

## 2018-01-09 LAB — CULTURE, BLOOD (ROUTINE X 2)
CULTURE: NO GROWTH
Culture: NO GROWTH
SPECIAL REQUESTS: ADEQUATE
Special Requests: ADEQUATE

## 2018-01-13 DIAGNOSIS — A491 Streptococcal infection, unspecified site: Secondary | ICD-10-CM | POA: Diagnosis not present

## 2018-01-13 DIAGNOSIS — I5030 Unspecified diastolic (congestive) heart failure: Secondary | ICD-10-CM | POA: Diagnosis not present

## 2018-01-13 DIAGNOSIS — I110 Hypertensive heart disease with heart failure: Secondary | ICD-10-CM | POA: Diagnosis not present

## 2018-01-13 DIAGNOSIS — I480 Paroxysmal atrial fibrillation: Secondary | ICD-10-CM | POA: Diagnosis not present

## 2018-01-13 DIAGNOSIS — I340 Nonrheumatic mitral (valve) insufficiency: Secondary | ICD-10-CM | POA: Diagnosis not present

## 2018-01-13 DIAGNOSIS — A419 Sepsis, unspecified organism: Secondary | ICD-10-CM | POA: Diagnosis not present

## 2018-01-13 DIAGNOSIS — I341 Nonrheumatic mitral (valve) prolapse: Secondary | ICD-10-CM | POA: Diagnosis not present

## 2018-01-18 DIAGNOSIS — R7881 Bacteremia: Secondary | ICD-10-CM | POA: Diagnosis not present

## 2018-01-19 ENCOUNTER — Encounter: Payer: Self-pay | Admitting: Infectious Diseases

## 2018-01-19 DIAGNOSIS — I480 Paroxysmal atrial fibrillation: Secondary | ICD-10-CM | POA: Diagnosis not present

## 2018-01-19 DIAGNOSIS — A419 Sepsis, unspecified organism: Secondary | ICD-10-CM | POA: Diagnosis not present

## 2018-01-19 DIAGNOSIS — A491 Streptococcal infection, unspecified site: Secondary | ICD-10-CM | POA: Diagnosis not present

## 2018-01-19 DIAGNOSIS — I5030 Unspecified diastolic (congestive) heart failure: Secondary | ICD-10-CM | POA: Diagnosis not present

## 2018-01-19 DIAGNOSIS — I340 Nonrheumatic mitral (valve) insufficiency: Secondary | ICD-10-CM | POA: Diagnosis not present

## 2018-01-19 DIAGNOSIS — I341 Nonrheumatic mitral (valve) prolapse: Secondary | ICD-10-CM | POA: Diagnosis not present

## 2018-01-19 DIAGNOSIS — I110 Hypertensive heart disease with heart failure: Secondary | ICD-10-CM | POA: Diagnosis not present

## 2018-01-26 DIAGNOSIS — I5030 Unspecified diastolic (congestive) heart failure: Secondary | ICD-10-CM | POA: Diagnosis not present

## 2018-01-26 DIAGNOSIS — I340 Nonrheumatic mitral (valve) insufficiency: Secondary | ICD-10-CM | POA: Diagnosis not present

## 2018-01-26 DIAGNOSIS — I480 Paroxysmal atrial fibrillation: Secondary | ICD-10-CM | POA: Diagnosis not present

## 2018-01-26 DIAGNOSIS — I110 Hypertensive heart disease with heart failure: Secondary | ICD-10-CM | POA: Diagnosis not present

## 2018-01-26 DIAGNOSIS — I341 Nonrheumatic mitral (valve) prolapse: Secondary | ICD-10-CM | POA: Diagnosis not present

## 2018-01-26 DIAGNOSIS — A419 Sepsis, unspecified organism: Secondary | ICD-10-CM | POA: Diagnosis not present

## 2018-01-26 DIAGNOSIS — A491 Streptococcal infection, unspecified site: Secondary | ICD-10-CM | POA: Diagnosis not present

## 2018-02-02 DIAGNOSIS — A419 Sepsis, unspecified organism: Secondary | ICD-10-CM | POA: Diagnosis not present

## 2018-02-02 DIAGNOSIS — I5030 Unspecified diastolic (congestive) heart failure: Secondary | ICD-10-CM | POA: Diagnosis not present

## 2018-02-02 DIAGNOSIS — I480 Paroxysmal atrial fibrillation: Secondary | ICD-10-CM | POA: Diagnosis not present

## 2018-02-02 DIAGNOSIS — A491 Streptococcal infection, unspecified site: Secondary | ICD-10-CM | POA: Diagnosis not present

## 2018-02-02 DIAGNOSIS — I110 Hypertensive heart disease with heart failure: Secondary | ICD-10-CM | POA: Diagnosis not present

## 2018-02-02 DIAGNOSIS — I341 Nonrheumatic mitral (valve) prolapse: Secondary | ICD-10-CM | POA: Diagnosis not present

## 2018-02-02 DIAGNOSIS — I340 Nonrheumatic mitral (valve) insufficiency: Secondary | ICD-10-CM | POA: Diagnosis not present

## 2018-02-03 ENCOUNTER — Telehealth: Payer: Self-pay | Admitting: Behavioral Health

## 2018-02-03 NOTE — Telephone Encounter (Signed)
Larena Glassman from [facility] called stating patient's end date for IV Ceftriaxone is 02/13/2018 however patient's follow up appointment is 02/18/2018 with Dr. Johnnye Sima.  Larena Glassman wants to know what the plan for antibiotics will be.  She wants to know if IV antibiotics need to be continued until patient's appointment with Dr. Johnnye Sima 02/18/2018 or Pull PICC on original end date of 02/13/2018. Pricilla Riffle RN

## 2018-02-09 DIAGNOSIS — I5030 Unspecified diastolic (congestive) heart failure: Secondary | ICD-10-CM | POA: Diagnosis not present

## 2018-02-09 DIAGNOSIS — I110 Hypertensive heart disease with heart failure: Secondary | ICD-10-CM | POA: Diagnosis not present

## 2018-02-09 DIAGNOSIS — A419 Sepsis, unspecified organism: Secondary | ICD-10-CM | POA: Diagnosis not present

## 2018-02-09 DIAGNOSIS — I480 Paroxysmal atrial fibrillation: Secondary | ICD-10-CM | POA: Diagnosis not present

## 2018-02-09 DIAGNOSIS — A491 Streptococcal infection, unspecified site: Secondary | ICD-10-CM | POA: Diagnosis not present

## 2018-02-09 DIAGNOSIS — I341 Nonrheumatic mitral (valve) prolapse: Secondary | ICD-10-CM | POA: Diagnosis not present

## 2018-02-09 DIAGNOSIS — I340 Nonrheumatic mitral (valve) insufficiency: Secondary | ICD-10-CM | POA: Diagnosis not present

## 2018-02-11 NOTE — Telephone Encounter (Signed)
Spoke with advanced thanks

## 2018-02-13 DIAGNOSIS — A491 Streptococcal infection, unspecified site: Secondary | ICD-10-CM | POA: Diagnosis not present

## 2018-02-13 DIAGNOSIS — I480 Paroxysmal atrial fibrillation: Secondary | ICD-10-CM | POA: Diagnosis not present

## 2018-02-13 DIAGNOSIS — I340 Nonrheumatic mitral (valve) insufficiency: Secondary | ICD-10-CM | POA: Diagnosis not present

## 2018-02-13 DIAGNOSIS — I110 Hypertensive heart disease with heart failure: Secondary | ICD-10-CM | POA: Diagnosis not present

## 2018-02-13 DIAGNOSIS — I5030 Unspecified diastolic (congestive) heart failure: Secondary | ICD-10-CM | POA: Diagnosis not present

## 2018-02-13 DIAGNOSIS — I341 Nonrheumatic mitral (valve) prolapse: Secondary | ICD-10-CM | POA: Diagnosis not present

## 2018-02-18 ENCOUNTER — Ambulatory Visit (INDEPENDENT_AMBULATORY_CARE_PROVIDER_SITE_OTHER): Payer: Medicare Other | Admitting: Infectious Diseases

## 2018-02-18 ENCOUNTER — Encounter: Payer: Self-pay | Admitting: Infectious Diseases

## 2018-02-18 DIAGNOSIS — A491 Streptococcal infection, unspecified site: Secondary | ICD-10-CM | POA: Diagnosis not present

## 2018-02-18 NOTE — Assessment & Plan Note (Signed)
He is doing well and is asx now.  Will repeat his BCx today He will f/u with [facility] with regards to repeat TEE.  Would give him prophylaxis for endocarditis with any future procedures.  I am available as needed.

## 2018-02-18 NOTE — Progress Notes (Signed)
   Subjective:    Patient ID: Chase Martin., male    DOB: 1945-07-15, 72 y.o.   MRN: [id number]  HPI 72 y.o. male with hx of afib, mitral valve prolapse who started to experience fever, chills x 10days was seen by his PCP, Dr Inda Merlin who started FUO work up. " I felt like I had the flu without the cough". His blood cx 7-5 were positive for gram positive cocci in chains and clusters, thus sent to ED 7-6 for further management. In the ED, he was found to be febrile at Temp of 101F. His BCx grewStreptococcal mutans in 1 set. And 2nd set showing streptococcal mutans.   Had previous gi illness, had colonoscopy in may with removal of 5 benign polyps, had dental cleaning in march, and eye surgery in late winter/early spring. Dental cleaning ~ 2 months prior.  He had BCx x 2 on 7-7 that were negative.  He underwent TEE showing MV IE on 01-05-18. He was d/c home on 7-10 with plan for ceftriaxone to end on 02-13-18.  PIC was out on day after. Has CV f/u appt on 02-22-18.   Had recent dental extraction and root canal.  Has been walking up to 3 miles a day.   Review of Systems  Constitutional: Negative for chills and fever.  Respiratory: Negative for cough and shortness of breath.   Cardiovascular: Negative for chest pain and leg swelling.  Please see HPI. All other systems reviewed and negative.      Objective:   Physical Exam  Constitutional: He is oriented to person, place, and time. He appears well-developed and well-nourished.  HENT:  Mouth/Throat: No oropharyngeal exudate.  Eyes: Pupils are equal, round, and reactive to light. EOM are normal.  Neck: Normal range of motion. Neck supple.  Cardiovascular: Normal rate and regular rhythm.  Murmur heard. Pulmonary/Chest: Effort normal and breath sounds normal.  Abdominal: Soft. Bowel sounds are normal. He exhibits no distension. There is no tenderness.  Musculoskeletal: He exhibits no edema.  Lymphadenopathy:    He has no cervical  adenopathy.  Neurological: He is alert and oriented to person, place, and time.  Skin: Skin is warm and dry.             Assessment & Plan:

## 2018-02-22 ENCOUNTER — Encounter: Payer: Self-pay | Admitting: Cardiology

## 2018-02-22 ENCOUNTER — Ambulatory Visit (INDEPENDENT_AMBULATORY_CARE_PROVIDER_SITE_OTHER): Payer: Medicare Other | Admitting: Cardiology

## 2018-02-22 VITALS — BP 118/80 | HR 68 | Ht 72.0 in | Wt 207.6 lb

## 2018-02-22 DIAGNOSIS — I340 Nonrheumatic mitral (valve) insufficiency: Secondary | ICD-10-CM

## 2018-02-22 DIAGNOSIS — I480 Paroxysmal atrial fibrillation: Secondary | ICD-10-CM | POA: Diagnosis not present

## 2018-02-22 DIAGNOSIS — I10 Essential (primary) hypertension: Secondary | ICD-10-CM

## 2018-02-22 DIAGNOSIS — I77810 Thoracic aortic ectasia: Secondary | ICD-10-CM | POA: Diagnosis not present

## 2018-02-22 MED ORDER — CARVEDILOL 6.25 MG PO TABS: 3.1250 mg | ORAL_TABLET | Freq: Two times a day (BID) | ORAL | 3 refills | Status: DC

## 2018-02-22 NOTE — Progress Notes (Signed)
Cardiology Office Note:    Date:  02/22/2018   ID:  [name], DOB April 10, 1946, MRN [id number]  PCP:  Josetta Huddle, MD  Cardiologist:  Fransico Him, MD    Referring MD: Josetta Huddle, MD   Chief Complaint  Patient presents with  . Atrial Fibrillation  . Mitral Regurgitation  . Hypertension    History of Present Illness:    Dayvian Blixt. is a 72 y.o. male with a hx of bileaflet mitral valve prolapse with mildMR, dilated aortic root, grade 2 diastolic dysfunction and PAF.  He is on Eliquis for a CHADS2VASC score of 4.    He was admitted with strep viridans bacteremia in July 2019.  2D echo 12/2017 showed normal LVF with EF 60-65% with severe holosystolic MVP of the posterior MV leaflet with severe MR that was eccentric.  Due to bacteremia patient underwent TEE which confirmed severe mitral prolapse of the sleep with severe MR.  There was also an oscillating density that was felt to possibly be due to a ruptured cord but could not rule out small vegetation and patient was treated for possible endocarditis.  He is here today for followup and is doing well.  He denies any chest pain or pressure, SOB, DOE, PND, orthopnea, LE edema, dizziness or syncope.  He still has breakthrough atrial fibrillation about 2 times weekly lasting about a half a day each time he gets it.  He says is just annoying.  He is compliant with  meds and is tolerating meds with no SE.    Past Medical History:  Diagnosis Date  . Abdominal mass, RLQ (right lower quadrant) 07/27/2012  . Abdominal pain   . Actinic keratoses   . Allergic rhinitis   . Benign essential HTN 02/07/2015  . BPH (benign prostatic hyperplasia)    mild  . Dilated aortic root ([facility])    34 mm by echo 12/2017  . ED (erectile dysfunction) 08/2011   cancelled f/u with Urology  . Epididymitis    recurrent  . Folliculitis   . Generalized headaches    slight, dull  . Hypertension    in the past - resolved  . Left ventricular diastolic  dysfunction, NYHA class 2   . Meckel's diverticulitis 08/27/2012  . Moderate mitral regurgitation 08/08/2013  . MVP (mitral valve prolapse)    Bileaflet MVP with mild to moderate MR by echo 10/2017  . PAF (paroxysmal atrial fibrillation) ([facility]) 05/13/2017   CHADS2VASC score is 2 (HTN and age > 49) on Apixaban  . Seborrheic keratosis   . Sigmoid diverticulosis 08/1999   noted on flexible sigmoidoscopy   . Uvulitis 11/2000   acute - responded to augmentin  . Vertigo    episodic mild positional    Past Surgical History:  Procedure Laterality Date  . LAPAROSCOPIC APPENDECTOMY N/A 08/11/2012   Procedure: APPENDECTOMY LAPAROSCOPIC;  Surgeon: Stark Klein, MD;  Location: [facility];  Service: General;  Laterality: N/A;  . LAPAROSCOPIC SMALL BOWEL RESECTION N/A 08/11/2012   Procedure: Diagnostic Laparoscopy,  Diverticulectomy and laparoscopic appendectomy;  Surgeon: Stark Klein, MD;  Location: [facility];  Service: General;  Laterality: N/A;  Diagnostic Laparoscopy, Small Bowel Resection vs Diverticulectomy   . PILONIDAL CYST EXCISION    . RECONSTRUCTION OF NOSE     secondary to deviated septum  . TEE WITHOUT CARDIOVERSION N/A 08/08/2013   Procedure: TRANSESOPHAGEAL ECHOCARDIOGRAM (TEE);  Surgeon: Sueanne Margarita, MD;  Location: [facility];  Service: Cardiovascular;  Laterality: N/A;  .  TEE WITHOUT CARDIOVERSION N/A 01/05/2018   Procedure: TRANSESOPHAGEAL ECHOCARDIOGRAM (TEE);  Surgeon: Lelon Perla, MD;  Location: [facility] END[facility]COPY;  Service: Cardiovascular;  Laterality: N/A;  . TONSILLECTOMY      Current Medications: Current Meds  Medication Sig  . acetaminophen (TYLENOL) 325 MG tablet Take 2 tablets (650 mg total) by mouth every 6 (six) hours as needed for mild pain (or Fever >/= 101).  . carvedilol (COREG) 3.125 MG tablet Take 3.125 mg by mouth 2 (two) times daily.  . Cholecalciferol (VITAMIN D) 2000 UNITS tablet Take 2,000 Units daily by mouth.   . Coenzyme Q10 (CO Q-10 PO) Take 1 capsule by mouth  daily.  . Cyanocobalamin (VITAMIN B12 PO) Take 500 mg by mouth daily.  Marland Kitchen ELIQUIS 5 MG TABS tablet Take 5 mg by mouth 2 (two) times daily.  . flunisolide (NASALIDE) 25 MCG/ACT (0.025%) SOLN Place 2 sprays into the nose 2 (two) times daily as needed.  Marland Kitchen losartan (COZAAR) 50 MG tablet Take 50 mg by mouth daily.  . rosuvastatin (CRESTOR) 5 MG tablet Take 5 mg by mouth 3 (three) times a week.      Allergies:   Sulfa antibiotics; Ace inhibitors; and Antihistamines, chlorpheniramine-type   Social History   Socioeconomic History  . Marital status: Married    Spouse name: Not on file  . Number of children: Not on file  . Years of education: Not on file  . Highest education level: Not on file  Occupational History  . Not on file  Social Needs  . Financial resource strain: Not on file  . Food insecurity:    Worry: Not on file    Inability: Not on file  . Transportation needs:    Medical: Not on file    Non-medical: Not on file  Tobacco Use  . Smoking status: Never Smoker  . Smokeless tobacco: Never Used  Substance and Sexual Activity  . Alcohol use: Yes    Comment: one drink per day (wine or beer)  . Drug use: No  . Sexual activity: Yes  Lifestyle  . Physical activity:    Days per week: Not on file    Minutes per session: Not on file  . Stress: Not on file  Relationships  . Social connections:    Talks on phone: Not on file    Gets together: Not on file    Attends religious service: Not on file    Active member of club or organization: Not on file    Attends meetings of clubs or organizations: Not on file    Relationship status: Not on file  Other Topics Concern  . Not on file  Social History Narrative  . Not on file     Family History: The patient's family history includes Cancer in his father, maternal aunt, maternal uncle, mother, and paternal uncle; Heart failure in his father.  ROS:   Please see the history of present illness.    ROS  All other systems reviewed  and negative.   EKGs/Labs/Other Studies Reviewed:    The following studies were reviewed today: 2D echo and TEE from 12/2017 admisison  EKG:  EKG is not ordered today.  Recent Labs: 01/02/2018: ALT 21 01/06/2018: BUN 17; Creatinine, Ser 0.91; Hemoglobin 13.7; Platelets 277; Potassium 4.4; Sodium 139   Recent Lipid Panel No results found for: CHOL, TRIG, HDL, CHOLHDL, VLDL, LDLCALC, LDLDIRECT  Physical Exam:    VS:  BP 118/80   Pulse 68   Ht 6' (  1.829 m)   Wt 207 lb 9.6 oz (94.2 kg)   BMI 28.16 kg/m     Wt Readings from Last 3 Encounters:  02/22/18 207 lb 9.6 oz (94.2 kg)  02/18/18 207 lb (93.9 kg)  01/02/18 205 lb 0.4 oz (93 kg)     GEN:  Well nourished, well developed in no acute distress HEENT: Normal NECK: No JVD; No carotid bruits LYMPHATICS: No lymphadenopathy CARDIAC: RRR, no  rubs, gallops.  Midsystolic click at the apex with a 2/6 late peaking systolic murmur at the apex  rESPIRATORY:  Clear to auscultation without rales, wheezing or rhonchi  ABDOMEN: Soft, non-tender, non-distended MUSCULOSKELETAL:  No edema; No deformity  SKIN: Warm and dry NEUROLOGIC:  Alert and oriented x 3 PSYCHIATRIC:  Normal affect   ASSESSMENT:    1. Severe mitral valve regurgitation   2. PAF (paroxysmal atrial fibrillation) ([facility])   3. Dilated aortic root ([facility])   4. Benign essential HTN    PLAN:    In order of problems listed above:  1.  Severe mitral regurgitation secondary to severe mitral valve prolapse of the posterior mitral valve leaflet with possible ruptured chordae tendonae in the setting of possible strep viridans endocarditis.  This was treated with prolonged course of antibiotics per ID.  I will repeat a 2D echocardiogram today to reassess his MR.  If it is still severe then will refer to structural heart team for further evaluation.  2.  Paroxysmal atrial fibrillation -he has maintain normal sinus rhythm on exam but continues to have episodes of breakthrough at least  twice weekly lasting half a day at a time.  I have instructed him to increase his carvedilol to 6.25 mg twice daily and continue apixaban 5 mg twice daily.  Creatinine was normal at 0.91 and hemoglobin 13.7 on 01/06/2018.  3.  Dilated aortic root -echo 01/02/2017 showed normal size of 34 mm  4.  Hypertension -BP is well controlled on exam today.  Since we are increasing carvedilol to 6.25 mg twice daily for better suppression of A. Fib,  I will decrease losartan 25 mg daily to prevent hypotension.  Medication Adjustments/Labs and Tests Ordered: Current medicines are reviewed at length with the patient today.  Concerns regarding medicines are outlined above.  No orders of the defined types were placed in this encounter.  No orders of the defined types were placed in this encounter.   Signed, Fransico Him, MD  02/22/2018 12:44 PM    [place]

## 2018-02-22 NOTE — Patient Instructions (Signed)
Your physician has recommended you make the following change in your medication:  DECREASE LOSARTAN TO 25 MG EVERY DAY  INCREASE CARVEDILOL TO 6.25 MG TWICE DAILY   Your physician has requested that you have an echocardiogram. Echocardiography is a painless test that uses sound waves to create images of your heart. It provides your doctor with information about the size and shape of your heart and how well your heart's chambers and valves are working. This procedure takes approximately one hour. There are no restrictions for this procedure.  Your physician wants you to follow-up in: [place] will receive a reminder letter in the mail two months in advance. If you don't receive a letter, please call our office to schedule the follow-up appointment.

## 2018-02-24 LAB — CULTURE, BLOOD (SINGLE)
MICRO NUMBER: 91005186
MICRO NUMBER:: 91005185

## 2018-03-03 ENCOUNTER — Other Ambulatory Visit: Payer: Self-pay

## 2018-03-03 ENCOUNTER — Telehealth: Payer: Self-pay

## 2018-03-03 ENCOUNTER — Ambulatory Visit (HOSPITAL_COMMUNITY): Payer: Medicare Other | Attending: Cardiovascular Disease

## 2018-03-03 DIAGNOSIS — I480 Paroxysmal atrial fibrillation: Secondary | ICD-10-CM | POA: Insufficient documentation

## 2018-03-03 DIAGNOSIS — I340 Nonrheumatic mitral (valve) insufficiency: Secondary | ICD-10-CM | POA: Diagnosis not present

## 2018-03-03 DIAGNOSIS — I119 Hypertensive heart disease without heart failure: Secondary | ICD-10-CM | POA: Insufficient documentation

## 2018-03-03 DIAGNOSIS — I081 Rheumatic disorders of both mitral and tricuspid valves: Secondary | ICD-10-CM | POA: Diagnosis not present

## 2018-03-03 NOTE — Telephone Encounter (Signed)
-----   Message from Sueanne Margarita, MD sent at 03/03/2018  2:25 PM EDT ----- Echo showed normal LVF with EF 60-65% with flail posterior MVL with moderate but possible severe MR.  Please refer to structural heart team for evaluation of MR>

## 2018-03-15 ENCOUNTER — Ambulatory Visit (INDEPENDENT_AMBULATORY_CARE_PROVIDER_SITE_OTHER): Payer: Medicare Other | Admitting: Cardiovascular Disease

## 2018-03-15 ENCOUNTER — Encounter: Payer: Self-pay | Admitting: Physician Assistant

## 2018-03-15 VITALS — BP 104/64 | HR 62 | Ht 72.0 in | Wt 209.8 lb

## 2018-03-15 DIAGNOSIS — I340 Nonrheumatic mitral (valve) insufficiency: Secondary | ICD-10-CM | POA: Diagnosis not present

## 2018-03-15 DIAGNOSIS — I480 Paroxysmal atrial fibrillation: Secondary | ICD-10-CM | POA: Diagnosis not present

## 2018-03-15 NOTE — Patient Instructions (Addendum)
Medication Instructions:  Your provider recommends that you continue on your current medications as directed. Please refer to the Current Medication list given to you today.    Labwork: TODAY: BMET, CBC  Testing/Procedures: Your physician has requested that you have a cardiac catheterization. Cardiac catheterization is used to diagnose and/or treat various heart conditions. Doctors may recommend this procedure for a number of different reasons. The most common reason is to evaluate chest pain. Chest pain can be a symptom of coronary artery disease (CAD), and cardiac catheterization can show whether plaque is narrowing or blocking your heart's arteries. This procedure is also used to evaluate the valves, as well as measure the blood flow and oxygen levels in different parts of your heart. For further information please visit [email or web address]. Please follow instruction sheet, as given.  Follow-Up: You will be called to arrange your follow-up with Dr. Roxy Manns.  Any Other Special Instructions Will Be Listed Below (If Applicable).   _ [place] OFFICE [address], [place] [address] Dept: [phone] Loc: [place].  03/15/2018  You are scheduled for a Cardiac Catheterization on Wednesday, September 25 with Dr. Larae Grooms.  1. Please arrive at the [facility] (Main Entrance A) at [facility]: [address] [place], [place] [address] at 6:30 AM (This time is two hours before your procedure to ensure your preparation). Free valet parking service is available.   Special note: Every effort is made to have your procedure done on time. Please understand that emergencies sometimes delay scheduled procedures.  2. Diet: Do not eat solid foods after midnight.  The patient may have clear liquids until 5am upon the day of the procedure.  3. Labs: Today!  4. Medication  instructions in preparation for your procedure:  1) Take your last dose of Eliquis on Sunday night (9/22). You will be told when to resume.  2) Take ASPIRIN 81 mg the morning of your catheterization.  3) you may take your other meds as directed (except Eliquis) with sips of water the morning of your procedure.  5. Plan for one night stay--bring personal belongings. 6. Bring a current list of your medications and current insurance cards. 7. You MUST have a responsible person to drive you home. 8. Someone MUST be with you the first 24 hours after you arrive home or your discharge will be delayed. 9. Please wear clothes that are easy to get on and off and wear slip-on shoes.  Thank you for allowing [place] to care for you!   -- [facility]

## 2018-03-15 NOTE — H&P (View-Only) (Signed)
Cardiology Office Note:    Date:  03/15/2018   ID:  Chase Martin., DOB Apr 04, 1946, MRN [id number]  PCP:  Josetta Huddle, MD  Cardiologist:  Fransico Him, MD  Electrophysiologist:  None   Referring MD: Josetta Huddle, MD   Chief Complaint  Patient presents with  . Shortness of Breath   History of Present Illness:    Chase Coster. is a 72 y.o. male with a hx of mitral valve prolapse referred for structural heart evaluation of progressive mitral valve insufficiency.   The patient has a long-standing heart murmur.  He was diagnosed with mitral valve prolapse in the 1990s.  He has been followed closely by Dr. Radford Pax and has had moderate mitral regurgitation with fairly minimal symptoms of exertional dyspnea over time.  He is been able to remain physically active and he does quite a bit of work at his property in [place].  He also walks for exercise regularly, covering about 3 miles in 1 hour.  He only has shortness of breath with going uphill.  He otherwise has no limitation.  He denies chest pain, chest pressure, or lightheadedness.  He does experience heart palpitations on a fairly regular basis and has been diagnosed with paroxysmal atrial fibrillation over the past 18 months.  He is anticoagulated with apixaban and has had no bleeding problems.  He feels like he experiences atrial fibrillation for several hours about every other week.  The patient was hospitalized with Strep Viridans bacteremia in July 2019 when he presented with myalgias, chills, fever, night sweats, and headaches. He was evaluated by ID and treated with a 6 week course of antibiotics. Prior to his presentation he had undergone dental cleaning several months earlier and colonoscopy with polypectomy about 3 months earlier. FU blood cultures were clear.  He later went for dental evaluation and required tooth extraction and a root canal. A TEE demonstrated severe prolapse of the posterior mitral valve leaflet with severe  eccentric MR.  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  He feels like he's completely recovered and is back to his normal walking program.  He has had no recurrence of fevers or chills.   Past Medical History:  Diagnosis Date  . Abdominal mass, RLQ (right lower quadrant) 07/27/2012  . Benign essential HTN 02/07/2015  . BPH (benign prostatic hyperplasia)    mild  . Dilated aortic root (HCC)    34 mm by echo 12/2017  . ED (erectile dysfunction) 08/2011   cancelled f/u with Urology  . Epididymitis    recurrent  . Hypertension    in the past - resolved  . Left ventricular diastolic dysfunction, NYHA class 2   . Meckel's diverticulitis 08/27/2012  . Moderate mitral regurgitation 08/08/2013  . MVP (mitral valve prolapse)    Bileaflet MVP with mild to moderate MR by echo 10/2017  . PAF (paroxysmal atrial fibrillation) ([place]) 05/13/2017   CHADS2VASC score is 2 (HTN and age > 103) on Apixaban  . Vertigo    episodic mild positional    Past Surgical History:  Procedure Laterality Date  . LAPAROSCOPIC APPENDECTOMY N/A 08/11/2012   Procedure: APPENDECTOMY LAPAROSCOPIC;  Surgeon: Stark Klein, MD;  Location: [facility] ORS;  Service: General;  Laterality: N/A;  . LAPAROSCOPIC SMALL BOWEL RESECTION N/A 08/11/2012   Procedure: Diagnostic Laparoscopy,  Diverticulectomy and laparoscopic appendectomy;  Surgeon: Stark Klein, MD;  Location: [facility];  Service: General;  Laterality: N/A;  Diagnostic Laparoscopy, Small Bowel Resection vs  Diverticulectomy   . PILONIDAL CYST EXCISION    . RECONSTRUCTION OF NOSE     secondary to deviated septum  . TEE WITHOUT CARDIOVERSION N/A 08/08/2013   Procedure: TRANSESOPHAGEAL ECHOCARDIOGRAM (TEE);  Surgeon: Sueanne Margarita, MD;  Location[facility];  Service: Cardiovascular;  Laterality: N/A;  . TEE WITHOUT CARDIOVERSION N/A 01/05/2018   Procedure: TRANSESOPHAGEAL ECHOCARDIOGRAM (TEE);  Surgeon: Lelon Perla, MD;  Location: [facility] ENDOSCOPY;  Service: Cardiovascular;   Laterality: N/A;  . TONSILLECTOMY      Current Medications: Current Meds  Medication Sig  . acetaminophen (TYLENOL) 325 MG tablet Take 2 tablets (650 mg total) by mouth every 6 (six) hours as needed for mild pain (or Fever >/= 101).  . carvedilol (COREG) 6.25 MG tablet Take 6.25 mg by mouth 2 (two) times daily with a meal.  . Cholecalciferol (VITAMIN D) 2000 UNITS tablet Take 2,000 Units daily by mouth.   . Coenzyme Q10 (CO Q-10 PO) Take 1 capsule by mouth daily.  . Cyanocobalamin (VITAMIN B12 PO) Take 500 mg by mouth daily.  Marland Kitchen ELIQUIS 5 MG TABS tablet Take 5 mg by mouth 2 (two) times daily.  . flunisolide (NASALIDE) 25 MCG/ACT (0.025%) SOLN Place 2 sprays into the nose 2 (two) times daily as needed.  Marland Kitchen losartan (COZAAR) 50 MG tablet Take 25 mg by mouth daily.  . rosuvastatin (CRESTOR) 5 MG tablet Take 5 mg by mouth 3 (three) times a week.      Allergies:   Sulfa antibiotics; Ace inhibitors; and Antihistamines, chlorpheniramine-type   Social History   Socioeconomic History  . Marital status: Married    Spouse name: Not on file  . Number of children: Not on file  . Years of education: Not on file  . Highest education level: Not on file  Occupational History  . Not on file  Social Needs  . Financial resource strain: Not on file  . Food insecurity:    Worry: Not on file    Inability: Not on file  . Transportation needs:    Medical: Not on file    Non-medical: Not on file  Tobacco Use  . Smoking status: Never Smoker  . Smokeless tobacco: Never Used  Substance and Sexual Activity  . Alcohol use: Yes    Comment: one drink per day (wine or beer)  . Drug use: No  . Sexual activity: Yes  Lifestyle  . Physical activity:    Days per week: Not on file    Minutes per session: Not on file  . Stress: Not on file  Relationships  . Social connections:    Talks on phone: Not on file    Gets together: Not on file    Attends religious service: Not on file    Active member of club  or organization: Not on file    Attends meetings of clubs or organizations: Not on file    Relationship status: Not on file  Other Topics Concern  . Not on file  Social History Narrative  . Not on file     Family History: The patient's family history includes Cancer in his father, maternal aunt, maternal uncle, mother, and paternal uncle; Heart failure in his father.  ROS:   Please see the history of present illness.    All other systems reviewed and are negative.  EKGs/Labs/Other Studies Reviewed:    The following studies were reviewed today: Echo 03-03-2018: Study Conclusions  - Left ventricle: The cavity size was normal. Wall thickness  was   increased in a pattern of mild LVH. Systolic function was normal.   The estimated ejection fraction was in the range of 60% to 65%.   Doppler parameters are consistent with both elevated ventricular   end-diastolic filling pressure and elevated left atrial filling   pressure. - Mitral valve: Probable flail segment to posterior leaflet with   anteriorly directed MR> Only appears moderate by this current TTE   but morphologically suggests its severe. There was moderate   regurgitation. - Atrial septum: No defect or patent foramen ovale was identified.  TEE: Study Conclusions  - Left ventricle: Systolic function was normal. The estimated   ejection fraction was in the range of 55% to 60%. Wall motion was   normal; there were no regional wall motion abnormalities. - Aortic valve: No evidence of vegetation. There was mild   regurgitation. - Mitral valve: Moderate thickening. Severe prolapse, involving the   posterior leaflet. There was severe regurgitation directed   eccentrically, anteriorly, and toward the septum. - Left atrium: The atrium was moderately dilated. - Right atrium: The atrium was moderately dilated. - Atrial septum: No defect or patent foramen ovale was identified. - Tricuspid valve: No evidence of vegetation. -  Pulmonic valve: No evidence of vegetation.  Impressions:  - Normal LV function; biatrial enlargement; sclerotic aortic valve   with mild AI; severe prolapse of posterior MV leaflet; linear   oscillating density noted (possible rupture chord; cannot R/O   small vegetation); severe, eccentric MR; mild TR and PI.  EKG:  EKG is not ordered today.    Recent Labs: 01/02/2018: ALT 21 01/06/2018: BUN 17; Creatinine, Ser 0.91; Hemoglobin 13.7; Platelets 277; Potassium 4.4; Sodium 139  Recent Lipid Panel No results found for: CHOL, TRIG, HDL, CHOLHDL, VLDL, LDLCALC, LDLDIRECT  Physical Exam:    VS:  BP 104/64   Pulse 62   Ht 6' (1.829 m)   Wt 209 lb 12 oz (95.1 kg)   SpO2 96%   BMI 28.45 kg/m     Wt Readings from Last 3 Encounters:  03/15/18 209 lb 12 oz (95.1 kg)  02/22/18 207 lb 9.6 oz (94.2 kg)  02/18/18 207 lb (93.9 kg)     GEN:  Well nourished, well developed in no acute distress HEENT: Normal NECK: No JVD; No carotid bruits LYMPHATICS: No lymphadenopathy CARDIAC: RRR, 3/6 late systolic murmur at the apex RESPIRATORY:  Clear to auscultation without rales, wheezing or rhonchi  ABDOMEN: Soft, non-tender, non-distended MUSCULOSKELETAL:  No edema; No deformity  SKIN: Warm and dry NEUROLOGIC:  Alert and oriented x 3 PSYCHIATRIC:  Normal affect   ASSESSMENT:    1. Mitral valve insufficiency, unspecified etiology   2. PAF (paroxysmal atrial fibrillation) (HCC)    PLAN:    In order of problems listed above:  Echo and TEE images are reviewed. The patient has severe symptomatic mitral regurgitation from primary dysfunction of the mitral valve with severe posterior leaflet prolapse and possible ruptured chord.  Natural hx of mitral valve regurgitation reviewed with the patient. We discussed potential treatment options of continued medical therapy with serial echo studies, surgical mitral valve repair with concomitant Maze procedure, or percutaneous treatment with MitraClip. The  patient would be at low risk of surgery and likely would be a good candidate for minimally invasive mitral repair with Maze procedure. He has NYHA I symptoms of chronic diastolic HF, but does have symptomatic atrial fibrillation. I think he would benefit from surgical repair of his mitral valve.  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 We discussed proceeding with R/L heart catheterization followed by surgical consultation with Dr Roxy Manns. I have reviewed the risks, indications, and alternatives to cardiac catheterization, possible angioplasty, and stenting with the patient. Risks include but are not limited to bleeding, infection, vascular injury, stroke, myocardial infection, arrhythmia, kidney injury, radiation-related injury in the case of prolonged fluoroscopy use, emergency cardiac surgery, and death. The patient understands the risks of serious complication is 1-2 in 5929 with diagnostic cardiac cath and 1-2% or less with angioplasty/stenting.   Medication Adjustments/Labs and Tests Ordered: Current medicines are reviewed at length with the patient today.  Concerns regarding medicines are outlined above.  Orders Placed This Encounter  Procedures  . Basic metabolic panel  . CBC with Differential/Platelet  . Ambulatory referral to Cardiothoracic Surgery   No orders of the defined types were placed in this encounter.   Patient Instructions  Medication Instructions:  Your provider recommends that you continue on your current medications as directed. Please refer to the Current Medication list given to you today.    Labwork: TODAY: BMET, CBC  Testing/Procedures: Your physician has requested that you have a cardiac catheterization. Cardiac catheterization is used to diagnose and/or treat various heart conditions. Doctors may recommend this procedure for a number of different reasons. The most common reason is to evaluate chest pain. Chest pain  can be a symptom of coronary artery disease (CAD), and cardiac catheterization can show whether plaque is narrowing or blocking your heart's arteries. This procedure is also used to evaluate the valves, as well as measure the blood flow and oxygen levels in different parts of your heart. For further information please visit [email or web address]. Please follow instruction sheet, as given.  Follow-Up: You will be called to arrange your follow-up with Dr. Roxy Manns.  Any Other Special Instructions Will Be Listed Below (If Applicable).     If you need a refill on your cardiac medications before your next appointment, please call your pharmacy.      Signed, Sherren Mocha, MD  03/15/2018 4:58 PM    [place]

## 2018-03-15 NOTE — Progress Notes (Signed)
Cardiology Office Note:    Date:  03/15/2018   ID:  Chase Martin., DOB 07-Mar-1946, MRN [id number]  PCP:  Josetta Huddle, MD  Cardiologist:  Fransico Him, MD  Electrophysiologist:  None   Referring MD: Josetta Huddle, MD   Chief Complaint  Patient presents with  . Shortness of Breath   History of Present Illness:    Chase Whalley. is a 72 y.o. male with a hx of mitral valve prolapse referred for structural heart evaluation of progressive mitral valve insufficiency.   The patient has a long-standing heart murmur.  He was diagnosed with mitral valve prolapse in the 1990s.  He has been followed closely by Dr. Radford Smith and has had moderate mitral regurgitation with fairly minimal symptoms of exertional dyspnea over time.  He is been able to remain physically active and he does quite a bit of work at his property in [place].  He also walks for exercise regularly, covering about 3 miles in 1 hour.  He only has shortness of breath with going uphill.  He otherwise has no limitation.  He denies chest pain, chest pressure, or lightheadedness.  He does experience heart palpitations on a fairly regular basis and has been diagnosed with paroxysmal atrial fibrillation over the past 18 months.  He is anticoagulated with apixaban and has had no bleeding problems.  He feels like he experiences atrial fibrillation for several hours about every other week.  The patient was hospitalized with Strep Viridans bacteremia in July 2019 when he presented with myalgias, chills, fever, night sweats, and headaches. He was evaluated by ID and treated with a 6 week course of antibiotics. Prior to his presentation he had undergone dental cleaning several months earlier and colonoscopy with polypectomy about 3 months earlier. FU blood cultures were clear.  He later went for dental evaluation and required tooth extraction and a root canal. A TEE demonstrated severe prolapse of the posterior mitral valve leaflet with severe  eccentric MR.  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  He feels like he's completely recovered and is back to his normal walking program.  He has had no recurrence of fevers or chills.   Past Medical History:  Diagnosis Date  . Abdominal mass, RLQ (right lower quadrant) 07/27/2012  . Benign essential HTN 02/07/2015  . BPH (benign prostatic hyperplasia)    mild  . Dilated aortic root (HCC)    34 mm by echo 12/2017  . ED (erectile dysfunction) 08/2011   cancelled f/u with Urology  . Epididymitis    recurrent  . Hypertension    in the past - resolved  . Left ventricular diastolic dysfunction, NYHA class 2   . Meckel's diverticulitis 08/27/2012  . Moderate mitral regurgitation 08/08/2013  . MVP (mitral valve prolapse)    Bileaflet MVP with mild to moderate MR by echo 10/2017  . PAF (paroxysmal atrial fibrillation) ([place]) 05/13/2017   CHADS2VASC score is 2 (HTN and age > 73) on Apixaban  . Vertigo    episodic mild positional    Past Surgical History:  Procedure Laterality Date  . LAPAROSCOPIC APPENDECTOMY N/A 08/11/2012   Procedure: APPENDECTOMY LAPAROSCOPIC;  Surgeon: Stark Klein, MD;  Location: [facility] ORS;  Service: General;  Laterality: N/A;  . LAPAROSCOPIC SMALL BOWEL RESECTION N/A 08/11/2012   Procedure: Diagnostic Laparoscopy,  Diverticulectomy and laparoscopic appendectomy;  Surgeon: Stark Klein, MD;  Location: [facility];  Service: General;  Laterality: N/A;  Diagnostic Laparoscopy, Small Bowel Resection vs  Diverticulectomy   . PILONIDAL CYST EXCISION    . RECONSTRUCTION OF NOSE     secondary to deviated septum  . TEE WITHOUT CARDIOVERSION N/A 08/08/2013   Procedure: TRANSESOPHAGEAL ECHOCARDIOGRAM (TEE);  Surgeon: Sueanne Margarita, MD;  Location[facility] ENDOSCOPY;  Service: Cardiovascular;  Laterality: N/A;  . TEE WITHOUT CARDIOVERSION N/A 01/05/2018   Procedure: TRANSESOPHAGEAL ECHOCARDIOGRAM (TEE);  Surgeon: Lelon Perla, MD;  Location: [facility] ENDOSCOPY;  Service: Cardiovascular;   Laterality: N/A;  . TONSILLECTOMY      Current Medications: Current Meds  Medication Sig  . acetaminophen (TYLENOL) 325 MG tablet Take 2 tablets (650 mg total) by mouth every 6 (six) hours as needed for mild pain (or Fever >/= 101).  . carvedilol (COREG) 6.25 MG tablet Take 6.25 mg by mouth 2 (two) times daily with a meal.  . Cholecalciferol (VITAMIN D) 2000 UNITS tablet Take 2,000 Units daily by mouth.   . Coenzyme Q10 (CO Q-10 PO) Take 1 capsule by mouth daily.  . Cyanocobalamin (VITAMIN B12 PO) Take 500 mg by mouth daily.  Marland Kitchen ELIQUIS 5 MG TABS tablet Take 5 mg by mouth 2 (two) times daily.  . flunisolide (NASALIDE) 25 MCG/ACT (0.025%) SOLN Place 2 sprays into the nose 2 (two) times daily as needed.  Marland Kitchen losartan (COZAAR) 50 MG tablet Take 25 mg by mouth daily.  . rosuvastatin (CRESTOR) 5 MG tablet Take 5 mg by mouth 3 (three) times a week.      Allergies:   Sulfa antibiotics; Ace inhibitors; and Antihistamines, chlorpheniramine-type   Social History   Socioeconomic History  . Marital status: Married    Spouse name: Not on file  . Number of children: Not on file  . Years of education: Not on file  . Highest education level: Not on file  Occupational History  . Not on file  Social Needs  . Financial resource strain: Not on file  . Food insecurity:    Worry: Not on file    Inability: Not on file  . Transportation needs:    Medical: Not on file    Non-medical: Not on file  Tobacco Use  . Smoking status: Never Smoker  . Smokeless tobacco: Never Used  Substance and Sexual Activity  . Alcohol use: Yes    Comment: one drink per day (wine or beer)  . Drug use: No  . Sexual activity: Yes  Lifestyle  . Physical activity:    Days per week: Not on file    Minutes per session: Not on file  . Stress: Not on file  Relationships  . Social connections:    Talks on phone: Not on file    Gets together: Not on file    Attends religious service: Not on file    Active member of club  or organization: Not on file    Attends meetings of clubs or organizations: Not on file    Relationship status: Not on file  Other Topics Concern  . Not on file  Social History Narrative  . Not on file     Family History: The patient's family history includes Cancer in his father, maternal aunt, maternal uncle, mother, and paternal uncle; Heart failure in his father.  ROS:   Please see the history of present illness.    All other systems reviewed and are negative.  EKGs/Labs/Other Studies Reviewed:    The following studies were reviewed today: Echo 03-03-2018: Study Conclusions  - Left ventricle: The cavity size was normal. Wall thickness  was   increased in a pattern of mild LVH. Systolic function was normal.   The estimated ejection fraction was in the range of 60% to 65%.   Doppler parameters are consistent with both elevated ventricular   end-diastolic filling pressure and elevated left atrial filling   pressure. - Mitral valve: Probable flail segment to posterior leaflet with   anteriorly directed MR> Only appears moderate by this current TTE   but morphologically suggests its severe. There was moderate   regurgitation. - Atrial septum: No defect or patent foramen ovale was identified.  TEE: Study Conclusions  - Left ventricle: Systolic function was normal. The estimated   ejection fraction was in the range of 55% to 60%. Wall motion was   normal; there were no regional wall motion abnormalities. - Aortic valve: No evidence of vegetation. There was mild   regurgitation. - Mitral valve: Moderate thickening. Severe prolapse, involving the   posterior leaflet. There was severe regurgitation directed   eccentrically, anteriorly, and toward the septum. - Left atrium: The atrium was moderately dilated. - Right atrium: The atrium was moderately dilated. - Atrial septum: No defect or patent foramen ovale was identified. - Tricuspid valve: No evidence of vegetation. -  Pulmonic valve: No evidence of vegetation.  Impressions:  - Normal LV function; biatrial enlargement; sclerotic aortic valve   with mild AI; severe prolapse of posterior MV leaflet; linear   oscillating density noted (possible rupture chord; cannot R/O   small vegetation); severe, eccentric MR; mild TR and PI.  EKG:  EKG is not ordered today.    Recent Labs: 01/02/2018: ALT 21 01/06/2018: BUN 17; Creatinine, Ser 0.91; Hemoglobin 13.7; Platelets 277; Potassium 4.4; Sodium 139  Recent Lipid Panel No results found for: CHOL, TRIG, HDL, CHOLHDL, VLDL, LDLCALC, LDLDIRECT  Physical Exam:    VS:  BP 104/64   Pulse 62   Ht 6' (1.829 m)   Wt 209 lb 12 oz (95.1 kg)   SpO2 96%   BMI 28.45 kg/m     Wt Readings from Last 3 Encounters:  03/15/18 209 lb 12 oz (95.1 kg)  02/22/18 207 lb 9.6 oz (94.2 kg)  02/18/18 207 lb (93.9 kg)     GEN:  Well nourished, well developed in no acute distress HEENT: Normal NECK: No JVD; No carotid bruits LYMPHATICS: No lymphadenopathy CARDIAC: RRR, 3/6 late systolic murmur at the apex RESPIRATORY:  Clear to auscultation without rales, wheezing or rhonchi  ABDOMEN: Soft, non-tender, non-distended MUSCULOSKELETAL:  No edema; No deformity  SKIN: Warm and dry NEUROLOGIC:  Alert and oriented x 3 PSYCHIATRIC:  Normal affect   ASSESSMENT:    1. Mitral valve insufficiency, unspecified etiology   2. PAF (paroxysmal atrial fibrillation) (HCC)    PLAN:    In order of problems listed above:  Echo and TEE images are reviewed. The patient has severe symptomatic mitral regurgitation from primary dysfunction of the mitral valve with severe posterior leaflet prolapse and possible ruptured chord.  Natural hx of mitral valve regurgitation reviewed with the patient. We discussed potential treatment options of continued medical therapy with serial echo studies, surgical mitral valve repair with concomitant Maze procedure, or percutaneous treatment with MitraClip. The  patient would be at low risk of surgery and likely would be a good candidate for minimally invasive mitral repair with Maze procedure. He has NYHA I symptoms of chronic diastolic HF, but does have symptomatic atrial fibrillation. I think he would benefit from surgical repair of his mitral valve.  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 We discussed proceeding with R/L heart catheterization followed by surgical consultation with Dr Roxy Manns. I have reviewed the risks, indications, and alternatives to cardiac catheterization, possible angioplasty, and stenting with the patient. Risks include but are not limited to bleeding, infection, vascular injury, stroke, myocardial infection, arrhythmia, kidney injury, radiation-related injury in the case of prolonged fluoroscopy use, emergency cardiac surgery, and death. The patient understands the risks of serious complication is 1-2 in 2355 with diagnostic cardiac cath and 1-2% or less with angioplasty/stenting.   Medication Adjustments/Labs and Tests Ordered: Current medicines are reviewed at length with the patient today.  Concerns regarding medicines are outlined above.  Orders Placed This Encounter  Procedures  . Basic metabolic panel  . CBC with Differential/Platelet  . Ambulatory referral to Cardiothoracic Surgery   No orders of the defined types were placed in this encounter.   Patient Instructions  Medication Instructions:  Your provider recommends that you continue on your current medications as directed. Please refer to the Current Medication list given to you today.    Labwork: TODAY: BMET, CBC  Testing/Procedures: Your physician has requested that you have a cardiac catheterization. Cardiac catheterization is used to diagnose and/or treat various heart conditions. Doctors may recommend this procedure for a number of different reasons. The most common reason is to evaluate chest pain. Chest pain  can be a symptom of coronary artery disease (CAD), and cardiac catheterization can show whether plaque is narrowing or blocking your heart's arteries. This procedure is also used to evaluate the valves, as well as measure the blood flow and oxygen levels in different parts of your heart. For further information please visit [facility]. Please follow instruction sheet, as given.  Follow-Up: You will be called to arrange your follow-up with Dr. Roxy Manns.  Any Other Special Instructions Will Be Listed Below (If Applicable).     If you need a refill on your cardiac medications before your next appointment, please call your pharmacy.      Signed, Sherren Mocha, MD  03/15/2018 4:58 PM    [place]

## 2018-03-16 LAB — CBC WITH DIFFERENTIAL/PLATELET
Basophils Absolute: 0.1 10*3/uL (ref 0.0–0.2)
Basos: 1 %
EOS (ABSOLUTE): 0.3 10*3/uL (ref 0.0–0.4)
EOS: 3 %
HEMATOCRIT: 45.3 % (ref 37.5–51.0)
HEMOGLOBIN: 15.3 g/dL (ref 13.0–17.7)
IMMATURE GRANULOCYTES: 0 %
Immature Grans (Abs): 0 10*3/uL (ref 0.0–0.1)
LYMPHS ABS: 1.9 10*3/uL (ref 0.7–3.1)
Lymphs: 25 %
MCH: 30.4 pg (ref 26.6–33.0)
MCHC: 33.8 g/dL (ref 31.5–35.7)
MCV: 90 fL (ref 79–97)
MONOCYTES: 9 %
Monocytes Absolute: 0.7 10*3/uL (ref 0.1–0.9)
Neutrophils Absolute: 4.6 10*3/uL (ref 1.4–7.0)
Neutrophils: 62 %
Platelets: 241 10*3/uL (ref 150–450)
RBC: 5.04 x10E6/uL (ref 4.14–5.80)
RDW: 13.4 % (ref 12.3–15.4)
WBC: 7.5 10*3/uL (ref 3.4–10.8)

## 2018-03-16 LAB — BASIC METABOLIC PANEL
BUN / CREAT RATIO: 18 (ref 10–24)
BUN: 17 mg/dL (ref 8–27)
CO2: 20 mmol/L (ref 20–29)
CREATININE: 0.94 mg/dL (ref 0.76–1.27)
Calcium: 9.1 mg/dL (ref 8.6–10.2)
Chloride: 104 mmol/L (ref 96–106)
GFR calc non Af Amer: 82 mL/min/{1.73_m2} (ref >59)
GFR, EST AFRICAN AMERICAN: 95 mL/min/{1.73_m2} (ref >59)
GLUCOSE: 73 mg/dL (ref 65–99)
Potassium: 4.8 mmol/L (ref 3.5–5.2)
Sodium: 140 mmol/L (ref 134–144)

## 2018-03-17 ENCOUNTER — Other Ambulatory Visit: Payer: Self-pay | Admitting: *Deleted

## 2018-03-22 ENCOUNTER — Telehealth: Payer: Self-pay | Admitting: Cardiovascular Disease

## 2018-03-22 MED ORDER — AMOXICILLIN 500 MG PO TABS: ORAL_TABLET | ORAL | 2 refills | Status: DC

## 2018-03-22 NOTE — Telephone Encounter (Signed)
New message:      Pt is wanting to know if he needs to start back taking antibiotics again before he has dental work.

## 2018-03-22 NOTE — Telephone Encounter (Signed)
Agree - Amoxil dose is 500mg , take 4. thanks

## 2018-03-22 NOTE — Telephone Encounter (Signed)
   Primary Cardiologist: Fransico Him, MD/ Structural - Dr. Burt Knack  Chart reviewed as part of pre-operative protocol coverage. Patient has history of mitral valve prolapse with severe mitral regurgitation, PAF, HTN, dilated aortic root, and endocarditis. The patient was hospitalized with Strep Viridans bacteremia in July 2019 when he presented with myalgias, chills, fever, night sweats, and headaches. He was evaluated by ID and treated with a 6 week course of antibiotics. Prior to his presentation he had undergone dental cleaning several months earlier and colonoscopy with polypectomy about 3 months earlier. He later went for dental evaluation and required tooth extraction and a root canal.  He is now calling in inquiring about antibiotics for future dental work.  I reviewed infectious disease Dr. Algis Downs last clinic note which states, "Would give him prophylaxis for endocarditis with any future procedures." Therefore, I would advise we call in amoxicillin 500mg  take 4 tablets 30-60 minutes prior to any dental procedures, disp #4 with 2 refills. Will route to pre-op callback staff to please let patient know and send into desired pharmacy. This may apply to future colonoscopies as well but this can be addressed with Dr. Burt Knack as the situation arises. Will also cc to Dr. Burt Knack to make him aware given ongoing mitral valve workup.  Charlie Pitter, PA-C 03/22/2018, 3:18 PM

## 2018-03-22 NOTE — Telephone Encounter (Signed)
Returned pts call re: Abx for future dental procedures. Per Melina Copa, PA-C's note, pt is to get Amoxicillin 400- mg taking 4 tablets 30-60 mins prior to dental procedure. Also made pt aware that this could as well go for his future colonoscopies as well, but he could address that with Dr. Burt Knack as the situation arises. Pt verbalized understanding.

## 2018-03-23 ENCOUNTER — Ambulatory Visit: Payer: Medicare Other

## 2018-03-23 ENCOUNTER — Telehealth: Payer: Self-pay | Admitting: *Deleted

## 2018-03-23 NOTE — Telephone Encounter (Signed)
Pt contacted pre-catheterization scheduled at [facility] for: Wednesday March 24, 2018 8:30 AM Verified arrival time and place: [place] Entrance A at: 6:30 AM  No solid food after midnight prior to cath, clear liquids until 5 AM day of procedure. Verified no contrast allergy  Hold-Eliquis-last dose PM 03/21/18 until post procedure.  Except hold medications AM meds can be  taken pre-cath with sip of water including: ASA 81 mg  Confirmed patient has responsible person to drive home post procedure and for 24 hours after you arrive home: yes

## 2018-03-24 ENCOUNTER — Encounter (HOSPITAL_COMMUNITY)
Admission: RE | Disposition: A | Discharge status: Home / Self Care | Payer: Self-pay | Source: Ambulatory Visit | Attending: Interventional Cardiology

## 2018-03-24 ENCOUNTER — Encounter (HOSPITAL_COMMUNITY): Payer: Self-pay | Admitting: Interventional Cardiology

## 2018-03-24 ENCOUNTER — Ambulatory Visit (HOSPITAL_COMMUNITY)
Admission: RE | Admit: 2018-03-24 | Discharge: 2018-03-24 | Disposition: A | Discharge status: Home / Self Care | Payer: Medicare Other | Source: Ambulatory Visit | Attending: Interventional Cardiology | Admitting: Interventional Cardiology

## 2018-03-24 ENCOUNTER — Other Ambulatory Visit: Payer: Self-pay

## 2018-03-24 DIAGNOSIS — Z9889 Other specified postprocedural states: Secondary | ICD-10-CM | POA: Diagnosis not present

## 2018-03-24 DIAGNOSIS — I340 Nonrheumatic mitral (valve) insufficiency: Secondary | ICD-10-CM | POA: Diagnosis not present

## 2018-03-24 DIAGNOSIS — Z8249 Family history of ischemic heart disease and other diseases of the circulatory system: Secondary | ICD-10-CM | POA: Diagnosis not present

## 2018-03-24 DIAGNOSIS — I480 Paroxysmal atrial fibrillation: Secondary | ICD-10-CM | POA: Diagnosis not present

## 2018-03-24 DIAGNOSIS — I110 Hypertensive heart disease with heart failure: Secondary | ICD-10-CM | POA: Diagnosis not present

## 2018-03-24 DIAGNOSIS — N529 Male erectile dysfunction, unspecified: Secondary | ICD-10-CM | POA: Diagnosis not present

## 2018-03-24 DIAGNOSIS — Z882 Allergy status to sulfonamides status: Secondary | ICD-10-CM | POA: Insufficient documentation

## 2018-03-24 DIAGNOSIS — Z79899 Other long term (current) drug therapy: Secondary | ICD-10-CM | POA: Insufficient documentation

## 2018-03-24 DIAGNOSIS — N400 Enlarged prostate without lower urinary tract symptoms: Secondary | ICD-10-CM | POA: Insufficient documentation

## 2018-03-24 DIAGNOSIS — Z888 Allergy status to other drugs, medicaments and biological substances status: Secondary | ICD-10-CM | POA: Diagnosis not present

## 2018-03-24 DIAGNOSIS — Z7901 Long term (current) use of anticoagulants: Secondary | ICD-10-CM | POA: Insufficient documentation

## 2018-03-24 DIAGNOSIS — I5032 Chronic diastolic (congestive) heart failure: Secondary | ICD-10-CM | POA: Diagnosis not present

## 2018-03-24 DIAGNOSIS — R0602 Shortness of breath: Secondary | ICD-10-CM | POA: Insufficient documentation

## 2018-03-24 DIAGNOSIS — Z8619 Personal history of other infectious and parasitic diseases: Secondary | ICD-10-CM | POA: Insufficient documentation

## 2018-03-24 HISTORY — PX: RIGHT/LEFT HEART CATH AND CORONARY ANGIOGRAPHY: CATH118266

## 2018-03-24 HISTORY — PX: CARDIAC CATHETERIZATION: SHX172

## 2018-03-24 LAB — POCT I-STAT 3, VENOUS BLOOD GAS (G3P V)
Acid-base deficit: 3 mmol/L — ABNORMAL HIGH (ref 0.0–2.0)
BICARBONATE: 21.3 mmol/L (ref 20.0–28.0)
O2 Saturation: 75 %
PCO2 VEN: 34.5 mmHg — AB (ref 44.0–60.0)
TCO2: 22 mmol/L (ref 22–32)
pH, Ven: 7.398 (ref 7.250–7.430)
pO2, Ven: 40 mmHg (ref 32.0–45.0)

## 2018-03-24 LAB — POCT I-STAT 3, ART BLOOD GAS (G3+)
Acid-base deficit: 4 mmol/L — ABNORMAL HIGH (ref 0.0–2.0)
Bicarbonate: 21.1 mmol/L (ref 20.0–28.0)
O2 Saturation: 97 %
PCO2 ART: 37.6 mmHg (ref 32.0–48.0)
PO2 ART: 91 mmHg (ref 83.0–108.0)
TCO2: 22 mmol/L (ref 22–32)
pH, Arterial: 7.356 (ref 7.350–7.450)

## 2018-03-24 LAB — POCT ACTIVATED CLOTTING TIME: ACTIVATED CLOTTING TIME: 125 s

## 2018-03-24 SURGERY — RIGHT/LEFT HEART CATH AND CORONARY ANGIOGRAPHY
Anesthesia: LOCAL

## 2018-03-24 MED ORDER — HEPARIN (PORCINE) IN NACL 1000-0.9 UT/500ML-% IV SOLN
INTRAVENOUS | Status: DC | PRN
  Administered 2018-03-24 (×2): 500 mL

## 2018-03-24 MED ORDER — SODIUM CHLORIDE 0.9 % IV SOLN: 250.0000 mL | INTRAVENOUS | Status: DC | PRN

## 2018-03-24 MED ORDER — SODIUM CHLORIDE 0.9% FLUSH: 3.0000 mL | Freq: Two times a day (BID) | INTRAVENOUS | Status: DC

## 2018-03-24 MED ORDER — ONDANSETRON HCL 4 MG/2ML IJ SOLN: 4.0000 mg | Freq: Four times a day (QID) | INTRAMUSCULAR | Status: DC | PRN

## 2018-03-24 MED ORDER — MIDAZOLAM HCL 2 MG/2ML IJ SOLN
INTRAMUSCULAR | Status: AC
  Filled 2018-03-24: qty 2

## 2018-03-24 MED ORDER — LIDOCAINE HCL (PF) 1 % IJ SOLN
INTRAMUSCULAR | Status: AC
  Filled 2018-03-24: qty 30

## 2018-03-24 MED ORDER — HEPARIN SODIUM (PORCINE) 1000 UNIT/ML IJ SOLN
INTRAMUSCULAR | Status: AC
  Filled 2018-03-24: qty 1

## 2018-03-24 MED ORDER — MIDAZOLAM HCL 2 MG/2ML IJ SOLN
INTRAMUSCULAR | Status: DC | PRN
  Administered 2018-03-24: 2 mg via INTRAVENOUS

## 2018-03-24 MED ORDER — SODIUM CHLORIDE 0.9% FLUSH: 3.0000 mL | INTRAVENOUS | Status: DC | PRN

## 2018-03-24 MED ORDER — HEPARIN SODIUM (PORCINE) 1000 UNIT/ML IJ SOLN
INTRAMUSCULAR | Status: DC | PRN
  Administered 2018-03-24: 4500 [IU] via INTRAVENOUS

## 2018-03-24 MED ORDER — VERAPAMIL HCL 2.5 MG/ML IV SOLN
INTRAVENOUS | Status: DC | PRN
  Administered 2018-03-24: 10 mL via INTRA_ARTERIAL

## 2018-03-24 MED ORDER — VERAPAMIL HCL 2.5 MG/ML IV SOLN
INTRAVENOUS | Status: AC
  Filled 2018-03-24: qty 2

## 2018-03-24 MED ORDER — ELIQUIS 5 MG PO TABS: 5.0000 mg | ORAL_TABLET | Freq: Two times a day (BID) | ORAL | Status: DC

## 2018-03-24 MED ORDER — LIDOCAINE HCL (PF) 1 % IJ SOLN
INTRAMUSCULAR | Status: DC | PRN
  Administered 2018-03-24 (×2): 2 mL via INTRADERMAL

## 2018-03-24 MED ORDER — ASPIRIN 81 MG PO CHEW: 81.0000 mg | CHEWABLE_TABLET | ORAL | Status: DC

## 2018-03-24 MED ORDER — FENTANYL CITRATE (PF) 100 MCG/2ML IJ SOLN
INTRAMUSCULAR | Status: AC
  Filled 2018-03-24: qty 2

## 2018-03-24 MED ORDER — SODIUM CHLORIDE 0.9 % IV SOLN: INTRAVENOUS | Status: DC

## 2018-03-24 MED ORDER — SODIUM CHLORIDE 0.9 % WEIGHT BASED INFUSION: 1.0000 mL/kg/h | INTRAVENOUS | Status: DC

## 2018-03-24 MED ORDER — HEPARIN (PORCINE) IN NACL 1000-0.9 UT/500ML-% IV SOLN
INTRAVENOUS | Status: AC
  Filled 2018-03-24: qty 1000

## 2018-03-24 MED ORDER — ACETAMINOPHEN 325 MG PO TABS: 650.0000 mg | ORAL_TABLET | ORAL | Status: DC | PRN

## 2018-03-24 MED ORDER — FENTANYL CITRATE (PF) 100 MCG/2ML IJ SOLN
INTRAMUSCULAR | Status: DC | PRN
  Administered 2018-03-24: 25 ug via INTRAVENOUS

## 2018-03-24 MED ORDER — SODIUM CHLORIDE 0.9 % WEIGHT BASED INFUSION: 3.0000 mL/kg/h | INTRAVENOUS | Status: DC

## 2018-03-24 SURGICAL SUPPLY — 13 items
CATH 5FR JL3.5 JR4 ANG PIG MP (CATHETERS) ×1 IMPLANT
CATH BALLN WEDGE 5F 110CM (CATHETERS) ×1 IMPLANT
DEVICE RAD COMP TR BAND LRG (VASCULAR PRODUCTS) ×1 IMPLANT
GLIDESHEATH SLEND SS 6F .021 (SHEATH) ×1 IMPLANT
GUIDEWIRE INQWIRE 1.5J.035X260 (WIRE) IMPLANT
INQWIRE 1.5J .035X260CM (WIRE) ×2
KIT HEART LEFT (KITS) ×2 IMPLANT
PACK CARDIAC CATHETERIZATION (CUSTOM PROCEDURE TRAY) ×2 IMPLANT
SHEATH GLIDE SLENDER 4/5FR (SHEATH) ×1 IMPLANT
SYR MEDRAD MARK V 150ML (SYRINGE) ×2 IMPLANT
TRANSDUCER W/STOPCOCK (MISCELLANEOUS) ×2 IMPLANT
TUBING CIL FLEX 10 FLL-RA (TUBING) ×2 IMPLANT
WIRE HI TORQ VERSACORE-J 145CM (WIRE) ×1 IMPLANT

## 2018-03-24 NOTE — Discharge Instructions (Signed)

## 2018-03-24 NOTE — Interval H&P Note (Signed)
Cath Lab Visit (complete for each Cath Lab visit)  Clinical Evaluation Leading to the Procedure:   ACS: No.  Non-ACS:    Anginal Classification: CCS III  Anti-ischemic medical therapy: Minimal Therapy (1 class of medications)  Non-Invasive Test Results: No non-invasive testing performed  Prior CABG: No previous CABG   Severe MR   History and Physical Interval Note:  03/24/2018 8:38 AM  Chase Smith.  has presented today for surgery, with the diagnosis of mr  The various methods of treatment have been discussed with the patient and family. After consideration of risks, benefits and other options for treatment, the patient has consented to  Procedure(s): RIGHT/LEFT HEART CATH AND CORONARY ANGIOGRAPHY (N/A) as a surgical intervention .  The patient's history has been reviewed, patient examined, no change in status, stable for surgery.  I have reviewed the patient's chart and labs.  Questions were answered to the patient's satisfaction.     Larae Grooms

## 2018-03-31 ENCOUNTER — Encounter: Payer: Self-pay | Admitting: Thoracic Surgery (Cardiothoracic Vascular Surgery)

## 2018-03-31 ENCOUNTER — Institutional Professional Consult (permissible substitution) (INDEPENDENT_AMBULATORY_CARE_PROVIDER_SITE_OTHER): Payer: Medicare Other | Admitting: Thoracic Surgery (Cardiothoracic Vascular Surgery)

## 2018-03-31 ENCOUNTER — Other Ambulatory Visit: Payer: Self-pay | Admitting: *Deleted

## 2018-03-31 VITALS — BP 121/75 | HR 62 | Resp 20 | Ht 72.0 in | Wt 208.0 lb

## 2018-03-31 DIAGNOSIS — I341 Nonrheumatic mitral (valve) prolapse: Secondary | ICD-10-CM | POA: Diagnosis not present

## 2018-03-31 DIAGNOSIS — I340 Nonrheumatic mitral (valve) insufficiency: Secondary | ICD-10-CM

## 2018-03-31 DIAGNOSIS — Z01818 Encounter for other preprocedural examination: Secondary | ICD-10-CM

## 2018-03-31 DIAGNOSIS — I7409 Other arterial embolism and thrombosis of abdominal aorta: Secondary | ICD-10-CM

## 2018-03-31 DIAGNOSIS — I480 Paroxysmal atrial fibrillation: Secondary | ICD-10-CM | POA: Diagnosis not present

## 2018-03-31 DIAGNOSIS — I4891 Unspecified atrial fibrillation: Secondary | ICD-10-CM

## 2018-03-31 NOTE — Progress Notes (Signed)
[address]       [place] [address]             [facility] REPORT  Referring Provider is Sherren Mocha, MD  Primary Cardiologist is Sueanne Margarita, MD PCP is Josetta Huddle, MD  Chief Complaint  Patient presents with  . Mitral Regurgitation    Surgical eval, ECHO 03/03/18, Cardiac Cath 03/24/18, TEE 01/05/18    HPI:  Patient is a 72 year old male with history of mitral valve prolapse with mitral regurgitation, recurrent paroxysmal atrial fibrillation and hypertension who has been referred for surgical consultation to discuss treatment options for management of severe symptomatic primary mitral regurgitation with recurrent paroxysmal atrial fibrillation.  Patient states that he was first noted to have a heart murmur on physical exam in 1995.  He was followed for many years by his primary care physician, and more recently he has been followed by Dr. Radford Pax.  Previous echocardiograms have documented the presence of mitral valve prolapse with what was felt to be moderate mitral regurgitation.  Several years ago he began to experience episodes of tachycardia palpitations and shortness of breath.  He was diagnosed with paroxysmal atrial fibrillation.  He has been anticoagulated using Eliquis and for the most part he remains in sinus rhythm.  The patient was hospitalized in July 2019 with Streptococcus viridans bacteremia.  Transesophageal echocardiogram performed at that time demonstrated the presence of mitral valve prolapse with severe mitral regurgitation.  No definite vegetation was identified.  The patient was treated for presumed bacterial endocarditis with a 6-week course of intravenous antibiotics.  Symptoms of myalgias, fevers, night sweats, and headaches all resolved.  He was evaluated by his dentist as an outpatient and noted to have a root abscess requiring tooth extraction and a second problematic tooth requiring root  canal.  These were performed uneventfully.  The patient was subsequently referred to Dr. Burt Knack for consultation.  He subsequently underwent left and right heart catheterization on March 24, 2018.  Catheterization confirmed the presence of severe mitral regurgitation.  The patient was noted to have normal coronary arteries with no significant coronary artery disease.  Right heart pressures and left ventricular end-diastolic pressure was normal.  The patient was referred for surgical consultation.  Patient is married and lives with his wife in [place], [place] in a retirement home that the patient built several years ago.  He has remained fairly active physically.  He enjoys doing numerous projects on his house or in his yard.  He walks on a regular basis, albeit at a moderate pace.  He states that he gets short of breath going up hills.  He reports occasional palpitations associated with shortness of breath that seem to correlate with episodes of paroxysmal atrial fibrillation.  He thinks these episodes occur 2 or 3 times monthly.  He has not had any chest pain or chest tightness.  He denies any resting shortness of breath, PND, orthopnea, or lower extremity edema.  Past Medical History:  Diagnosis Date  . Abdominal mass, RLQ (right lower quadrant) 07/27/2012  . Benign essential HTN 02/07/2015  . BPH (benign prostatic hyperplasia)    mild  . Dilated aortic root (HCC)    34 mm by echo 12/2017  . ED (erectile dysfunction) 08/2011   cancelled f/u with Urology  . Epididymitis    recurrent  . Hypertension    in the past - resolved  . Left ventricular  diastolic dysfunction, NYHA class 2   . Meckel's diverticulitis 08/27/2012  . Moderate mitral regurgitation 08/08/2013  . MVP (mitral valve prolapse)    Bileaflet MVP with mild to moderate MR by echo 10/2017  . PAF (paroxysmal atrial fibrillation) ([place]) 05/13/2017   CHADS2VASC score is 2 (HTN and age > 96) on Apixaban  . Vertigo    episodic mild  positional    Past Surgical History:  Procedure Laterality Date  . LAPAROSCOPIC APPENDECTOMY N/A 08/11/2012   Procedure: APPENDECTOMY LAPAROSCOPIC;  Surgeon: Stark Klein, MD;  Location: [facility];  Service: General;  Laterality: N/A;  . LAPAROSCOPIC SMALL BOWEL RESECTION N/A 08/11/2012   Procedure: Diagnostic Laparoscopy,  Diverticulectomy and laparoscopic appendectomy;  Surgeon: Stark Klein, MD;  Location: [facility];  Service: General;  Laterality: N/A;  Diagnostic Laparoscopy, Small Bowel Resection vs Diverticulectomy   . PILONIDAL CYST EXCISION    . RECONSTRUCTION OF NOSE     secondary to deviated septum  . RIGHT/LEFT HEART CATH AND CORONARY ANGIOGRAPHY N/A 03/24/2018   Procedure: RIGHT/LEFT HEART CATH AND CORONARY ANGIOGRAPHY;  Surgeon: Jettie Booze, MD;  Location: [facility];  Service: Cardiovascular;  Laterality: N/A;  . TEE WITHOUT CARDIOVERSION N/A 08/08/2013   Procedure: TRANSESOPHAGEAL ECHOCARDIOGRAM (TEE);  Surgeon: Sueanne Margarita, MD;  Location: [facility] END[facility];  Service: Cardiovascular;  Laterality: N/A;  . TEE WITHOUT CARDIOVERSION N/A 01/05/2018   Procedure: TRANSESOPHAGEAL ECHOCARDIOGRAM (TEE);  Surgeon: Lelon Perla, MD;  Location: [facility] ENDOSCOPY;  Service: Cardiovascular;  Laterality: N/A;  . TONSILLECTOMY      Family History  Problem Relation Age of Onset  . Cancer Mother        breast  . Cancer Father        pancreatic  . Heart failure Father   . Cancer Maternal Aunt        ? ovarian  . Cancer Maternal Uncle        ? pancreatic  . Cancer Paternal Uncle        ? lung    Social History   Socioeconomic History  . Marital status: Married    Spouse name: Not on file  . Number of children: Not on file  . Years of education: Not on file  . Highest education level: Not on file  Occupational History  . Not on file  Social Needs  . Financial resource strain: Not on file  . Food insecurity:    Worry: Not on file    Inability: Not on file  .  Transportation needs:    Medical: Not on file    Non-medical: Not on file  Tobacco Use  . Smoking status: Never Smoker  . Smokeless tobacco: Never Used  Substance and Sexual Activity  . Alcohol use: Yes    Comment: one drink per day (wine or beer)  . Drug use: No  . Sexual activity: Yes  Lifestyle  . Physical activity:    Days per week: Not on file    Minutes per session: Not on file  . Stress: Not on file  Relationships  . Social connections:    Talks on phone: Not on file    Gets together: Not on file    Attends religious service: Not on file    Active member of club or organization: Not on file    Attends meetings of clubs or organizations: Not on file    Relationship status: Not on file  . Intimate partner violence:    Fear of  current or ex partner: Not on file    Emotionally abused: Not on file    Physically abused: Not on file    Forced sexual activity: Not on file  Other Topics Concern  . Not on file  Social History Narrative  . Not on file    Current Outpatient Medications  Medication Sig Dispense Refill  . acetaminophen (TYLENOL) 325 MG tablet Take 2 tablets (650 mg total) by mouth every 6 (six) hours as needed for mild pain (or Fever >/= 101).    Marland Kitchen amoxicillin (AMOXIL) 500 MG tablet Take 4 tablets by mouth 30-60 minutes prior to dental procedures 4 tablet 2  . carvedilol (COREG) 6.25 MG tablet Take 6.25 mg by mouth 2 (two) times daily with a meal.    . Cholecalciferol (VITAMIN D) 2000 UNITS tablet Take 2,000 Units daily by mouth.     . Coenzyme Q10 (CO Q-10 PO) Take 1 capsule by mouth daily.    . Cyanocobalamin (VITAMIN B12) 500 MCG TABS Take 500 mcg by mouth daily.     Marland Kitchen ELIQUIS 5 MG TABS tablet Take 1 tablet (5 mg total) by mouth 2 (two) times daily.    . flunisolide (NASALIDE) 25 MCG/ACT (0.025%) SOLN Place 2 sprays into the nose daily as needed.     . hydroxypropyl methylcellulose / hypromellose (ISOPTO TEARS / GONIOVISC) 2.5 % ophthalmic solution Place 1  drop into the left eye daily as needed for dry eyes.    Marland Kitchen losartan (COZAAR) 50 MG tablet Take 25 mg by mouth daily.    . rosuvastatin (CRESTOR) 5 MG tablet Take 5 mg by mouth every Monday, Wednesday, and Friday.   3   No current facility-administered medications for this visit.     Allergies  Allergen Reactions  . Sulfa Antibiotics Other (See Comments)    Severe lethargy  . Ace Inhibitors Cough  . Antihistamines, Chlorpheniramine-Type Other (See Comments)    Trouble urinating. Patient st "all antihistamines" cause this.       Review of Systems:   General:  normal appetite, normal energy, no weight gain, no weight loss, no fever  Cardiac:  no chest pain with exertion, no chest pain at rest, +SOB with exertion, no resting SOB, no PND, no orthopnea, + palpitations, + arrhythmia, + atrial fibrillation, no LE edema, no dizzy spells, no syncope  Respiratory:  no shortness of breath, no home oxygen, no productive cough, no dry cough, no bronchitis, no wheezing, no hemoptysis, no asthma, no pain with inspiration or cough, no sleep apnea, no CPAP at night  GI:   no difficulty swallowing, no reflux, no frequent heartburn, no hiatal hernia, no abdominal pain, no constipation, no diarrhea, no hematochezia, no hematemesis, no melena  GU:   no dysuria,  no frequency, no urinary tract infection, no hematuria, no enlarged prostate, no kidney stones, no kidney disease  Vascular:  no pain suggestive of claudication, no pain in feet, no leg cramps, no varicose veins, no DVT, no non-healing foot ulcer  Neuro:   no stroke, no TIA's, no seizures, no headaches, no temporary blindness one eye,  no slurred speech, no peripheral neuropathy, no chronic pain, no instability of gait, no memory/cognitive dysfunction  Musculoskeletal: no arthritis, no joint swelling, no myalgias, no difficulty walking, normal mobility   Skin:   no rash, no itching, no skin infections, no pressure sores or ulcerations  Psych:   no  anxiety, no depression, no nervousness, no unusual recent stress  Eyes:  no blurry vision, no floaters, no recent vision changes, + wears glasses or contacts  ENT:   + hearing loss, no loose or painful teeth, no dentures, last saw dentist within the past 2 months  Hematologic:  no easy bruising, no abnormal bleeding, no clotting disorder, no frequent epistaxis  Endocrine:  no diabetes, does not check CBG's at home     Physical Exam:   BP 121/75   Pulse 62   Resp 20   Ht 6' (1.829 m)   Wt 208 lb (94.3 kg)   SpO2 98% Comment: RA  BMI 28.21 kg/m   General:  Thin,  well-appearing  HEENT:  Unremarkable   Neck:   no JVD, no bruits, no adenopathy   Chest:   clear to auscultation, symmetrical breath sounds, no wheezes, no rhonchi   CV:   RRR, grade III/VI holosystolic murmur best at apex  Abdomen:  soft, non-tender, no masses   Extremities:  warm, well-perfused, pulses palpable, no LE edema  Rectal/GU  Deferred  Neuro:   Grossly non-focal and symmetrical throughout  Skin:   Clean and dry, no rashes, no breakdown   Diagnostic Tests:  Transesophageal Echocardiography  Patient:    Xaine, Sansom MR #:       [id number] Study Date: 01/05/2018 Gender:     M Age:        72 Height:     182.9 cm Weight:     93.2 kg BSA:        2.19 m^2 Pt. Status: Room:       [place], Arshad N  ORDERING     Simmons, [place], [place] M  ATTENDING    Dessa Phi Chahn-Yang  SONOGRAPHER  Jannett Celestine, RDCS  cc:  ------------------------------------------------------------------- LV EF: 55% -   60%  ------------------------------------------------------------------- History:   PMH:  Bacteremia Evaluation 790.7.  Risk factors:  MVP. Paroxysmal A-FIb. Hypertension.  ------------------------------------------------------------------- Study Conclusions  - Left ventricle: Systolic function was normal. The  estimated   ejection fraction was in the range of 55% to 60%. Wall motion was   normal; there were no regional wall motion abnormalities. - Aortic valve: No evidence of vegetation. There was mild   regurgitation. - Mitral valve: Moderate thickening. Severe prolapse, involving the   posterior leaflet. There was severe regurgitation directed   eccentrically, anteriorly, and toward the septum. - Left atrium: The atrium was moderately dilated. - Right atrium: The atrium was moderately dilated. - Atrial septum: No defect or patent foramen ovale was identified. - Tricuspid valve: No evidence of vegetation. - Pulmonic valve: No evidence of vegetation.  Impressions:  - Normal LV function; biatrial enlargement; sclerotic aortic valve   with mild AI; severe prolapse of posterior MV leaflet; linear   oscillating density noted (possible rupture chord; cannot R/O   small vegetation); severe, eccentric MR; mild TR and PI.  ------------------------------------------------------------------- Study data:   Study status:  Routine.  Consent:  The risks, benefits, and alternatives to the procedure were explained to the patient and informed consent was obtained.  Procedure:  The patient reported no pain pre or post test. Initial setup. The patient was brought to the laboratory. Surface ECG leads were monitored. Sedation. Sedation was administered by anesthesiology staff. Transesophageal echocardiography. Topical anesthesia was obtained using viscous lidocaine. A transesophageal probe was inserted by the attending cardiologistwithout difficulty. Image quality was adequate.  Study  completion:  The patient tolerated the procedure well. There were no complications.          Diagnostic transesophageal echocardiography.  2D and color Doppler. Birthdate:  Patient birthdate: May 02, 1946.  Age:  Patient is 72 yr old.  Sex:  Gender: male.    BMI: 27.9 kg/m^2.  Blood pressure: 108/59  Patient status:   Inpatient.  Study date:  Study date: 01/05/2018. Study time: 11:02 AM.  Location:  Endoscopy.  -------------------------------------------------------------------  ------------------------------------------------------------------- Left ventricle:  Systolic function was normal. The estimated ejection fraction was in the range of 55% to 60%. Wall motion was normal; there were no regional wall motion abnormalities.  ------------------------------------------------------------------- Aortic valve:   Mildly thickened leaflets. Cusp separation was normal.  No evidence of vegetation.  Doppler:  There was mild regurgitation.  ------------------------------------------------------------------- Aorta:  Descending aorta: The descending aorta had mild diffuse disease.  ------------------------------------------------------------------- Mitral valve:   Moderate thickening.  Severe prolapse, involving the posterior leaflet.  Doppler:  There was severe regurgitation directed eccentrically, anteriorly, and toward the septum.  ------------------------------------------------------------------- Left atrium:  The atrium was moderately dilated.  ------------------------------------------------------------------- Atrial septum:  No defect or patent foramen ovale was identified.   ------------------------------------------------------------------- Right ventricle:  The cavity size was normal. Systolic function was normal.  ------------------------------------------------------------------- Pulmonic valve:    Structurally normal valve.   Cusp separation was normal.  No evidence of vegetation.  Doppler:  There was mild regurgitation.  ------------------------------------------------------------------- Tricuspid valve:   Structurally normal valve.   Leaflet separation was normal.  No evidence of vegetation.  Doppler:  There was  mild regurgitation.  ------------------------------------------------------------------- Right atrium:  The atrium was moderately dilated.  ------------------------------------------------------------------- Pericardium:  There was no pericardial effusion.   ------------------------------------------------------------------- Prepared and Electronically Authenticated by  Kirk Ruths 2019-07-09T14:53:26   Echocardiography  Patient:    Stephone, Gum MR #:       [id number] Study Date: 03/03/2018 Gender:     M Age:        11 Height:     182.9 cm Weight:     94.2 kg BSA:        2.2 m^2 Pt. Status: Room:   ATTENDING    Fransico Him, MD  ORDERING     Fransico Him, MD  Schneider, MD  SONOGRAPHER  Victorio Palm, RDCS  PERFORMING   [facility], Outpatient  cc:  ------------------------------------------------------------------- LV EF: 60% -   65%  ------------------------------------------------------------------- Indications:      (I34.0).  ------------------------------------------------------------------- History:   PMH:  Acquired from the patient and from the patient&'s chart.  Paroxysmal atrial fibrillation.  Severe mitral valve prolapse. Severe mitral regurgitation secondary to posterior leaflet mitral valve prolapse with possible rupture of chordae tendonae in the setting of possible strep viridans.  Risk factors: Hypertension.  ------------------------------------------------------------------- Study Conclusions  - Left ventricle: The cavity size was normal. Wall thickness was   increased in a pattern of mild LVH. Systolic function was normal.   The estimated ejection fraction was in the range of 60% to 65%.   Doppler parameters are consistent with both elevated ventricular   end-diastolic filling pressure and elevated left atrial filling   pressure. - Mitral valve: Probable flail segment to posterior leaflet with   anteriorly directed  MR> Only appears moderate by this current TTE   but morphologically suggests its severe. There was moderate   regurgitation. - Atrial septum: No defect or patent foramen ovale was identified.  ------------------------------------------------------------------- Labs, prior tests, procedures, and surgery:  Transesophageal echocardiography (July 2019).    The mitral valve showed severe regurgitation with sever mitral valve prolapse. The aortic valve showed mild regurgitation.  EF was 60%.  ------------------------------------------------------------------- Study data:  Comparison was made to the study of July 2019.  Study status:  Routine.  Procedure:  The patient reported no pain pre or post test. Transthoracic echocardiography for diagnosis, for left ventricular function evaluation, for right ventricular function evaluation, and for assessment of valvular function. Image quality was adequate.          Echocardiography.  M-mode, complete 2D, spectral Doppler, and color Doppler.  Birthdate:  Patient birthdate: 02-23-1946.  Age:  Patient is 72 yr old.  Sex:  Gender: male.    BMI: 28.1 kg/m^2.  Blood pressure:     118/80  Patient status:  Outpatient.  Study date:  Study date: 03/03/2018. Study time: 10:30 AM.  Location:  [facility]  -------------------------------------------------------------------  ------------------------------------------------------------------- Left ventricle:  The cavity size was normal. Wall thickness was increased in a pattern of mild LVH. Systolic function was normal. The estimated ejection fraction was in the range of 60% to 65%. Doppler parameters are consistent with both elevated ventricular end-diastolic filling pressure and elevated left atrial filling pressure.  ------------------------------------------------------------------- Aortic valve:   Trileaflet; mildly thickened, mildly calcified leaflets.  Doppler:   There was no  stenosis.  ------------------------------------------------------------------- Aorta:  The aorta was normal, not dilated, and non-diseased. Ascending aorta: The ascending aorta was mildly dilated.  ------------------------------------------------------------------- Mitral valve:  Probable flail segment to posterior leaflet with anteriorly directed MR> Only appears moderate by this current TTE but morphologically suggests its severe.  Doppler:  There was moderate regurgitation.    Peak gradient (D): 4 mm Hg.  ------------------------------------------------------------------- Left atrium:  The atrium was at the upper limits of normal in size.   ------------------------------------------------------------------- Atrial septum:  No defect or patent foramen ovale was identified.   ------------------------------------------------------------------- Right ventricle:  The cavity size was normal. Wall thickness was normal. Systolic function was normal.  ------------------------------------------------------------------- Pulmonic valve:    Doppler:  There was mild regurgitation.  ------------------------------------------------------------------- Tricuspid valve:   Doppler:  There was mild regurgitation.  ------------------------------------------------------------------- Right atrium:  The atrium was normal in size.  ------------------------------------------------------------------- Pericardium:  The pericardium was normal in appearance.  ------------------------------------------------------------------- Systemic veins: Inferior vena cava: The vessel was normal in size. The respirophasic diameter changes were in the normal range (>= 50%), consistent with normal central venous pressure.  ------------------------------------------------------------------- Post procedure conclusions Ascending Aorta:  - The aorta was normal, not dilated, and  non-diseased.  ------------------------------------------------------------------- Measurements   Left ventricle                          Value        Reference  LV ID, ED, PLAX chordal                 46.6  mm     43 - 52  LV ID, ES, PLAX chordal                 29.3  mm     23 - 38  LV fx shortening, PLAX chordal          37    %      >=29  LV PW thickness, ED                     13.8  mm     ---------  IVS/LV PW ratio, ED                     1            <=1.3  Stroke volume, 2D                       98    ml     ---------  Stroke volume/bsa, 2D                   44    ml/m^2 ---------  LV e&', lateral                          5.77  cm/s   ---------  LV E/e&', lateral                        18.2         ---------  LV e&', medial                           5.87  cm/s   ---------  LV E/e&', medial                         17.89        ---------  LV e&', average                          5.82  cm/s   ---------  LV E/e&', average                        18.04        ---------    Ventricular septum                      Value        Reference  IVS thickness, ED                       13.8  mm     ---------    LVOT                                    Value        Reference  LVOT ID, S                              25    mm     ---------  LVOT area                               4.91  cm^2   ---------  LVOT ID                                 25    mm     ---------  LVOT peak velocity, S                   89.3  cm/s   ---------  LVOT mean velocity, S  60.5  cm/s   ---------  LVOT VTI, S                             20    cm     ---------  Stroke volume (SV), LVOT DP             98.2  ml     ---------  Stroke index (SV/bsa), LVOT DP          44.5  ml/m^2 ---------    Aorta                                   Value        Reference  Aortic root ID, ED                      38    mm     ---------  Ascending aorta ID, A-P, S              38    mm     ---------    Left atrium                              Value        Reference  LA ID, A-P, ES                          37    mm     ---------  LA ID/bsa, A-P                          1.68  cm/m^2 <=2.2  LA volume, S                            100   ml     ---------  LA volume/bsa, S                        45.4  ml/m^2 ---------  LA volume, ES, 1-p A4C                  91.8  ml     ---------  LA volume/bsa, ES, 1-p A4C              41.6  ml/m^2 ---------  LA volume, ES, 1-p A2C                  107   ml     ---------  LA volume/bsa, ES, 1-p A2C              48.5  ml/m^2 ---------    Mitral valve                            Value        Reference  Mitral E-wave peak velocity             105   cm/s   ---------  Mitral A-wave peak velocity             105   cm/s   ---------  Mitral deceleration time        (  H)     292   ms     150 - 230  Mitral peak gradient, D                 4     mm Hg  ---------  Mitral E/A ratio, peak                  1            ---------    Right atrium                            Value        Reference  RA ID, S-I, ES                          46.2  mm     ---------  RA ID, M-L, ES, A4C                     36.6  mm     30 - 46    Right ventricle                         Value        Reference  RV ID, minor axis, ED, A4C base         41.5  mm     ---------  TAPSE                                   25.7  mm     ---------  RV s&', lateral, S                       12    cm/s   ---------  Legend: (L)  and  (H)  mark values outside specified reference range.  ------------------------------------------------------------------- Prepared and Electronically Authenticated by  Jenkins Rouge, M.D. 2019-09-04T12:33:50   RIGHT/LEFT HEART CATH AND CORONARY ANGIOGRAPHY  Conclusion     The left ventricular systolic function is normal.  LV end diastolic pressure is normal.  The left ventricular ejection fraction is 55-65% by visual estimate.  There is severe (4+) mitral regurgitation.  There is no  aortic valve stenosis.  No significant coronary artery disease.   Continue with plans for mitral valve repair.       Indications   Severe mitral regurgitation [I34.0 (ICD-10-CM)]  Procedural Details/Technique   Technical Details The risks, benefits, and details of the procedure were explained to the patient. The patient verbalized understanding and wanted to proceed. Informed written consent was obtained.  PROCEDURE TECHNIQUE: After Xylocaine anesthesia, a 5 French sheath was placed in the right antecubital area in exchange for a peripheral IV. A 5 French balloontipped Swan-Ganz catheter was advanced to the pulmonary artery under fluoroscopic guidance. Hemodynamic pressures were obtained. Oxygen saturations were obtained. After Xylocaine anesthesia, a 29F sheath was placed in the right radial artery with a single anterior needle wall stick. Left coronary angiography was done using a Judkins L3.5 guide catheter. Right coronary angiography was done using a Judkins R4 guide catheter. Left heart cath was done using a pigtail catheter.     Contrast: 70 cc     Estimated blood loss <50 mL.  During this procedure the patient was administered  the following to achieve and maintain moderate conscious sedation: Versed 2 mg, Fentanyl 25 mcg, while the patient's heart rate, blood pressure, and oxygen saturation were continuously monitored. The period of conscious sedation was 28 minutes, of which I was present face-to-face 100% of this time.  Complications   Complications documented before study signed (03/24/2018 9:22 AM EDT)    No complications were associated with this study.  Documented by Jettie Booze, MD - 03/24/2018 9:20 AM EDT    Coronary Findings   Diagnostic  Dominance: Right  Left Main  Vessel was injected. Vessel is normal in caliber. Vessel is angiographically normal.  Left Anterior Descending  Vessel was injected. Vessel is normal in caliber. Vessel is  angiographically normal.  Left Circumflex  Vessel was injected. Vessel is normal in caliber. Vessel is angiographically normal.  Right Coronary Artery  Vessel was injected. Vessel is normal in caliber. Vessel is angiographically normal.  Intervention   No interventions have been documented.  Right Heart   Right Heart Pressures Ao sat 97%, PA sat 75%, PA pressure 23/10, mean PA 14 mm Hg; PCWP 11/9, mean PCWP 7 mm Hg; CO 6.2 L/min; CI 2.9  Wall Motion   Resting               Left Heart   Left Ventricle The left ventricular systolic function is normal. LV end diastolic pressure is normal. The left ventricular ejection fraction is 55-65% by visual estimate. No regional wall motion abnormalities. There is severe (4+) mitral regurgitation.  Aortic Valve There is no aortic valve stenosis.  Coronary Diagrams   Diagnostic Diagram       Implants    No implant documentation for this case.  MERGE Images   Show images for CARDIAC CATHETERIZATION   Link to Procedure Log   Procedure Log    Hemo Data    Most Recent Value  Fick Cardiac Output 6.28 L/min  Fick Cardiac Output Index 2.91 (L/min)/BSA  RA A Wave 11 mmHg  RA V Wave 8 mmHg  RA Mean 6 mmHg  RV Systolic Pressure 28 mmHg  RV Diastolic Pressure 3 mmHg  RV EDP 9 mmHg  PA Systolic Pressure 23 mmHg  PA Diastolic Pressure 6 mmHg  PA Mean 13 mmHg  PW A Wave 11 mmHg  PW V Wave 9 mmHg  PW Mean 7 mmHg  AO Systolic Pressure 322 mmHg  AO Diastolic Pressure 66 mmHg  AO Mean 87 mmHg  LV Systolic Pressure 025 mmHg  LV Diastolic Pressure 0 mmHg  LV EDP 12 mmHg  AOp Systolic Pressure 427 mmHg  AOp Diastolic Pressure 58 mmHg  AOp Mean Pressure 79 mmHg  LVp Systolic Pressure 062 mmHg  LVp Diastolic Pressure 2 mmHg  LVp EDP Pressure 11 mmHg  QP/QS 1  TPVR Index 4.47 HRUI  TSVR Index 29.96 HRUI  PVR SVR Ratio 0.07  TPVR/TSVR Ratio 0.15      Impression:  Patient has mitral valve prolapse with stage D severe  symptomatic primary mitral regurgitation.  He describes stable symptoms of exertional shortness of breath that occur only with more strenuous physical exertion, consistent with chronic diastolic congestive heart failure New York Heart Association functional class I-II.  The patient also experiences frequent recurrent episodes of palpitations and has been diagnosed with recurrent paroxysmal atrial fibrillation.  I have personally reviewed the patient's most recent transthoracic and transesophageal echocardiograms as well as his diagnostic cardiac catheterization.  The patient has myxomatous degenerative disease of the mitral  valve with a large flail segment involving a portion of the middle scallop of the posterior leaflet with multiple ruptured primary chordae tendinae.  There is severe mitral regurgitation.  Left ventricular size and function appear normal.  No other complicating features are seen.  The patient also recently was treated with bacterial endocarditis, but I see no evidence of active vegetations nor sign of leaflet perforation.  I suspect the patient should be able to expect very high likelihood of successful and durable mitral valve repair with low operative risk.  The patient would best be treated with elective mitral valve repair.  He might benefit from concomitant Maze procedure.  He may be a good candidate for minimally invasive approach for surgery.   Plan:  The patient was counseled at length regarding the indications, risks and potential benefits of mitral valve repair.  The rationale for elective surgery has been explained, including a comparison between surgery and continued medical therapy with close follow-up.  The likelihood of successful and durable valve repair has been discussed with particular reference to the findings of their recent echocardiogram.  Based upon these findings and previous experience, I have quoted him a greater than 95 percent likelihood of successful valve  repair.  Alternative surgical approaches have been discussed including a comparison between conventional sternotomy and minimally-invasive techniques.  The relative risks and benefits of each have been reviewed as they pertain to the patient's specific circumstances, and all of their questions have been addressed.   The relative risks and benefits of performing a maze procedure at the time of their surgery was discussed at length, including the expected likelihood of long term freedom from recurrent symptomatic atrial fibrillation and/or atrial flutter.  The patient understands and accepts all potential risks of surgery including but not limited to risk of death, stroke or other neurologic complication, myocardial infarction, congestive heart failure, respiratory failure, renal failure, bleeding requiring transfusion and/or reexploration, arrhythmia, infection or other wound complications, pneumonia, pleural and/or pericardial effusion, pulmonary embolus, aortic dissection or other major vascular complication, or delayed complications related to valve repair or replacement including but not limited to structural valve deterioration and failure, thrombosis, embolization, endocarditis, or paravalvular leak.  All of their questions have been answered.  We tentatively plan to proceed with minimally invasive mitral valve repair and Maze procedure on April 27, 2018.  Prior to surgery the patient will undergo CT angiography to evaluate the feasibility of peripheral cannulation for surgery.  The patient has been instructed to stop taking Eliquis 7 days prior to surgery.  He will return to our office for follow-up prior surgery on April 26, 2018.   I spent in excess of 90 minutes during the conduct of this office consultation and >50% of this time involved direct face-to-face encounter with the patient for counseling and/or coordination of their care.    Valentina Gu. Roxy Manns, MD 03/31/2018 3:07 PM

## 2018-03-31 NOTE — Patient Instructions (Addendum)
Stop taking Eliquis 7 days prior to surgery (October 22)  Continue taking all other medications without change through the day before surgery.  Have nothing to eat or drink after midnight the night before surgery.  On the morning of surgery take only Coreg (carvedilol) with a sip of water.

## 2018-04-01 ENCOUNTER — Encounter: Payer: Self-pay | Admitting: *Deleted

## 2018-04-13 ENCOUNTER — Other Ambulatory Visit: Payer: Self-pay | Admitting: Cardiology

## 2018-04-15 ENCOUNTER — Other Ambulatory Visit: Payer: Self-pay

## 2018-04-15 MED ORDER — CARVEDILOL 6.25 MG PO TABS: 6.2500 mg | ORAL_TABLET | Freq: Two times a day (BID) | ORAL | 3 refills | Status: DC

## 2018-04-20 ENCOUNTER — Ambulatory Visit
Admission: RE | Admit: 2018-04-20 | Discharge: 2018-04-20 | Disposition: A | Discharge status: Home / Self Care | Payer: Medicare Other | Source: Ambulatory Visit | Attending: Thoracic Surgery (Cardiothoracic Vascular Surgery) | Admitting: Thoracic Surgery (Cardiothoracic Vascular Surgery)

## 2018-04-20 DIAGNOSIS — I7409 Other arterial embolism and thrombosis of abdominal aorta: Secondary | ICD-10-CM

## 2018-04-20 DIAGNOSIS — L821 Other seborrheic keratosis: Secondary | ICD-10-CM | POA: Diagnosis not present

## 2018-04-20 DIAGNOSIS — L814 Other melanin hyperpigmentation: Secondary | ICD-10-CM | POA: Diagnosis not present

## 2018-04-20 DIAGNOSIS — I340 Nonrheumatic mitral (valve) insufficiency: Secondary | ICD-10-CM | POA: Diagnosis not present

## 2018-04-20 DIAGNOSIS — L578 Other skin changes due to chronic exposure to nonionizing radiation: Secondary | ICD-10-CM | POA: Diagnosis not present

## 2018-04-20 DIAGNOSIS — L570 Actinic keratosis: Secondary | ICD-10-CM | POA: Diagnosis not present

## 2018-04-20 DIAGNOSIS — I4891 Unspecified atrial fibrillation: Secondary | ICD-10-CM

## 2018-04-20 DIAGNOSIS — Z85828 Personal history of other malignant neoplasm of skin: Secondary | ICD-10-CM | POA: Diagnosis not present

## 2018-04-20 DIAGNOSIS — K7689 Other specified diseases of liver: Secondary | ICD-10-CM | POA: Diagnosis not present

## 2018-04-20 DIAGNOSIS — Z01818 Encounter for other preprocedural examination: Secondary | ICD-10-CM

## 2018-04-20 DIAGNOSIS — D229 Melanocytic nevi, unspecified: Secondary | ICD-10-CM | POA: Diagnosis not present

## 2018-04-20 IMAGING — CT CT CTA ABD/PEL W/CM AND/OR W/O CM
1 series · 11 of 32 positions shown · IV contrast (iopamidol)
Comparison: CT scan of August 06, 2012.

CLINICAL DATA: Mitral valve insufficiency.

EXAM:
CT ANGIOGRAPHY CHEST, ABDOMEN AND PELVIS
TECHNIQUE: Multidetector CT imaging through the chest, abdomen and pelvis was
performed using the standard protocol during bolus administration of
intravenous contrast. Multiplanar reconstructed images and MIPs were
obtained and reviewed to evaluate the vascular anatomy.
CONTRAST:  75mL P1CAYC-LAA IOPAMIDOL (P1CAYC-LAA) INJECTION 61%

[Series 9: cta thorax 2.00 bv36 s3 cor cor st · coronal · 0.79mm/px · 11 of 188 slices shown]
[im 14/188  lung]
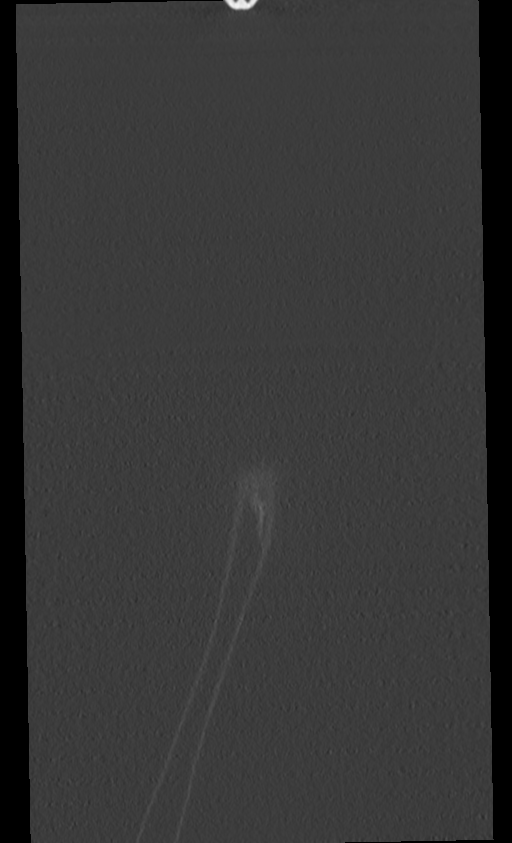
[im 35/188  mediastinal]
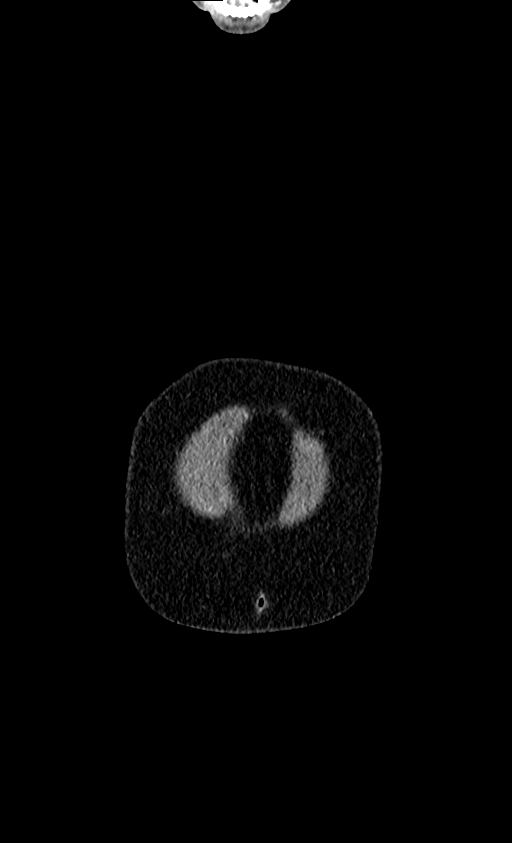
[im 42/188  lung]
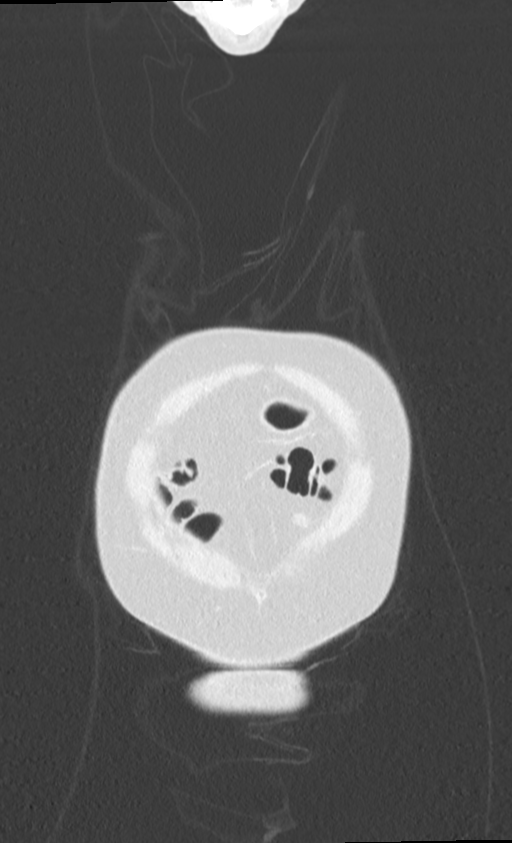
[im 63/188  mediastinal]
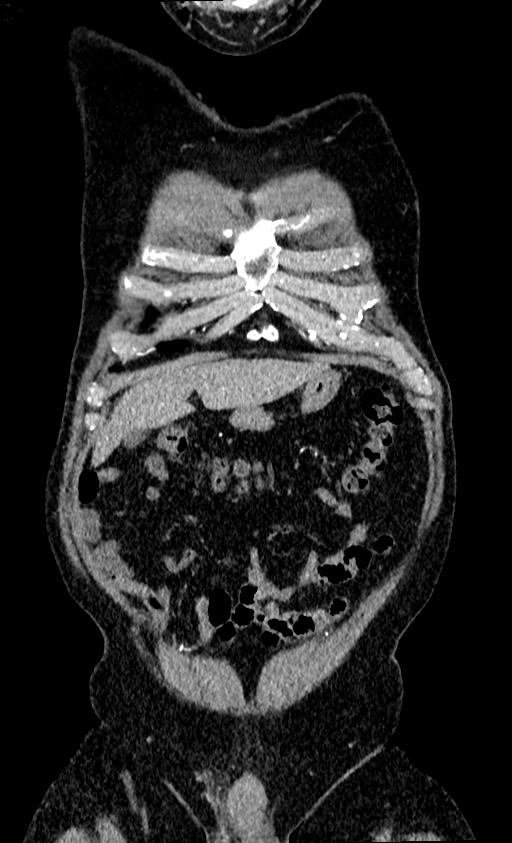
[im 77/188  lung]
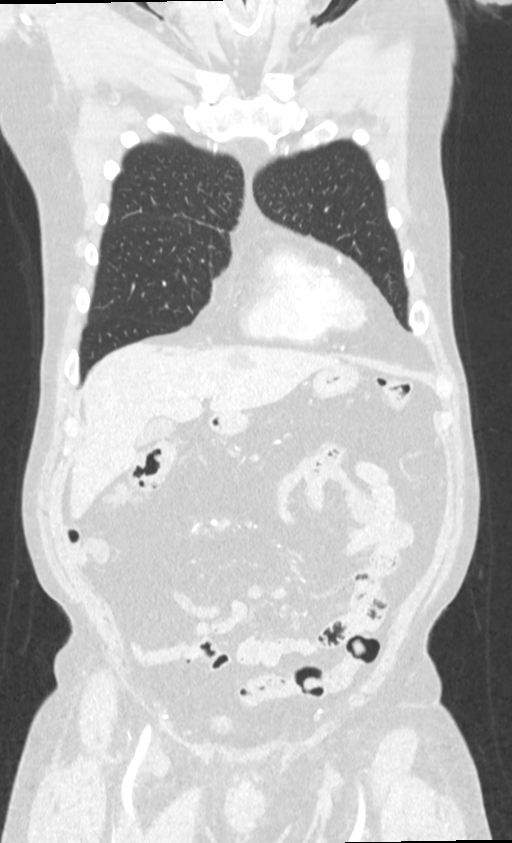
[im 97/188  mediastinal]
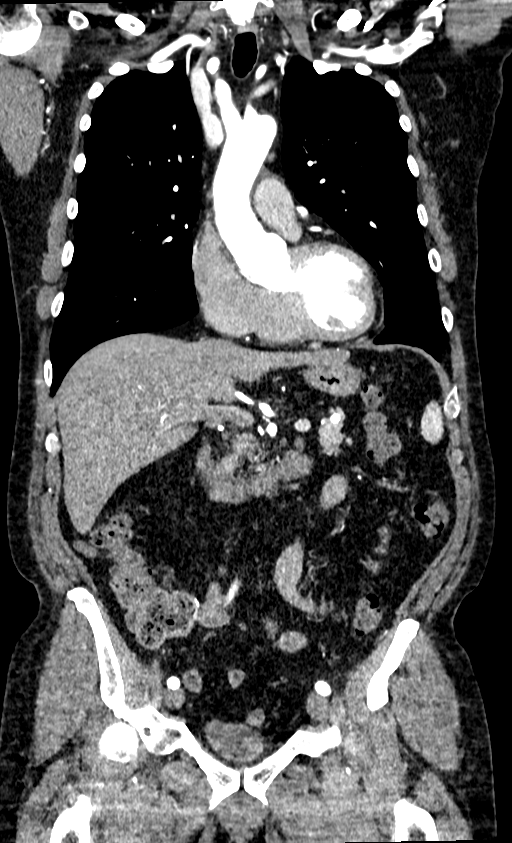
[im 111/188  lung]
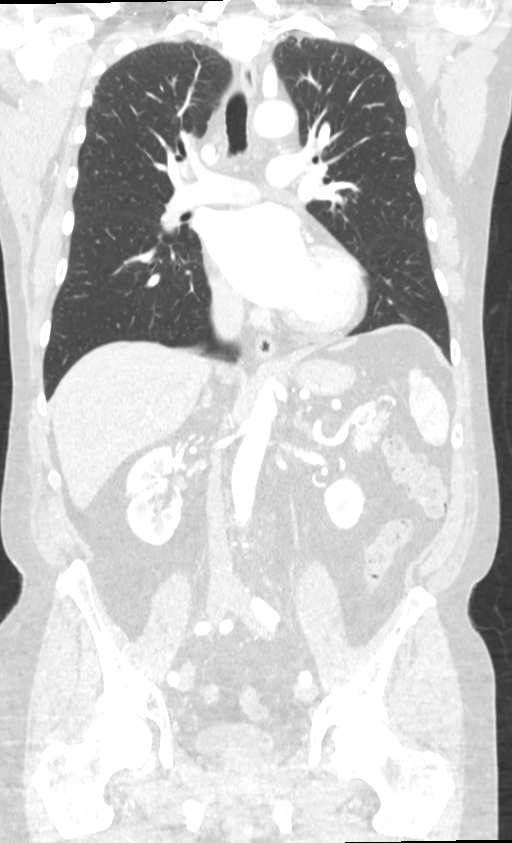
[im 125/188  mediastinal]
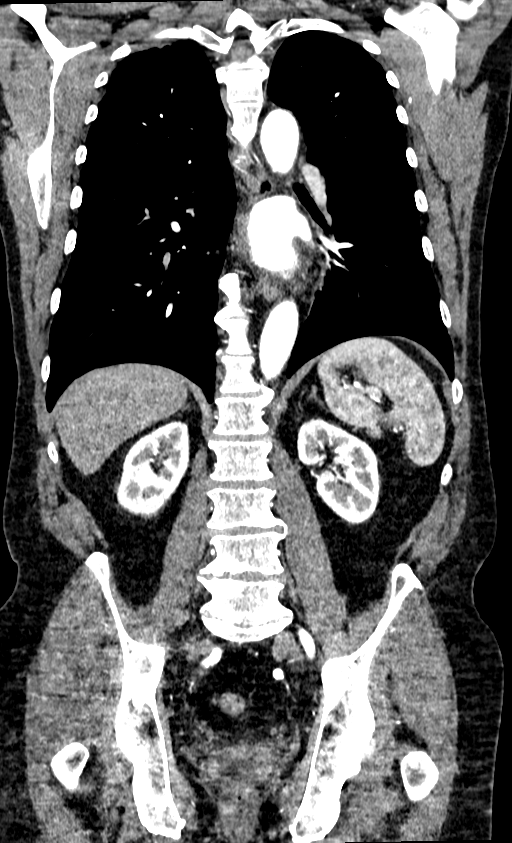
[im 146/188  lung]
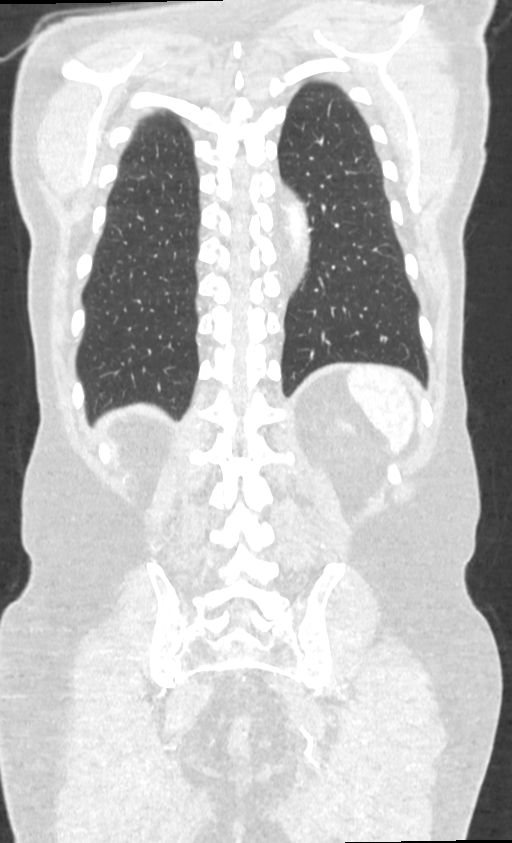
[im 153/188  mediastinal]
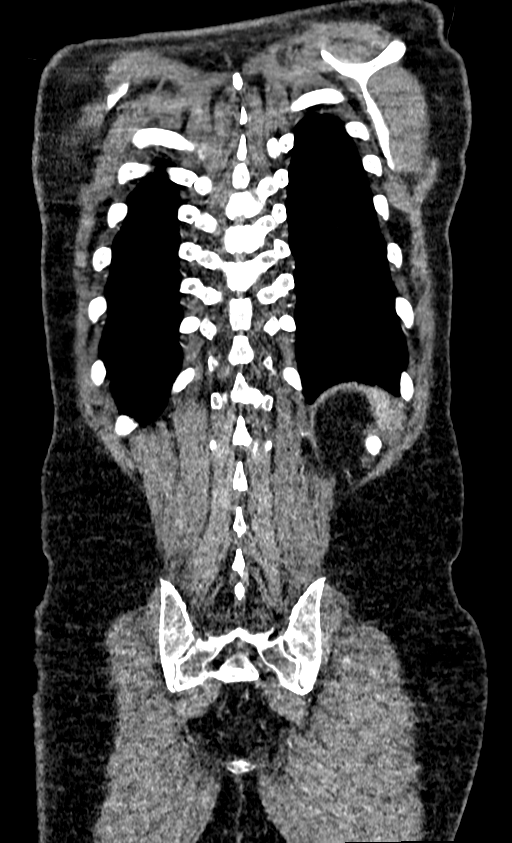
[im 174/188  lung]
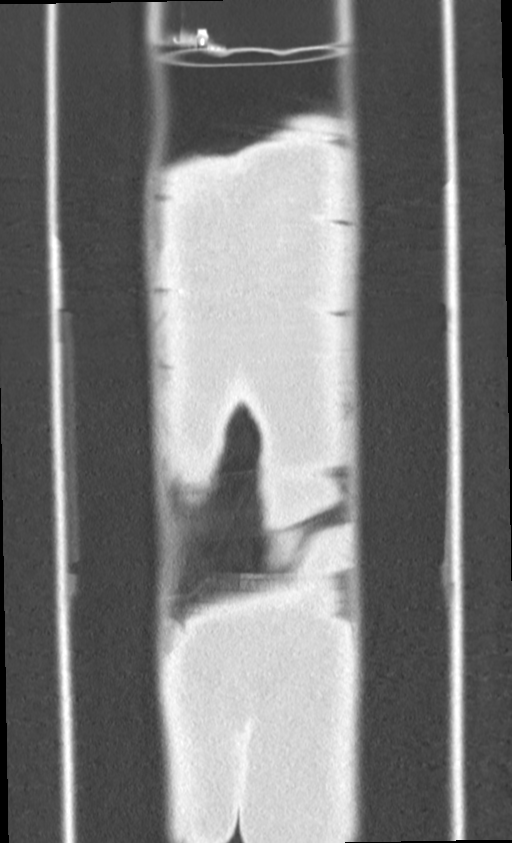

[11 of 32 positions shown; findings below may reference images not displayed]

FINDINGS: CTA CHEST FINDINGS

Cardiovascular: Satisfactory opacification of the pulmonary arteries
to the segmental level. No evidence of pulmonary embolism. Normal
heart size. No pericardial effusion.

Mediastinum/Nodes: No enlarged mediastinal, hilar, or axillary lymph
nodes. Thyroid gland, trachea, and esophagus demonstrate no
significant findings.

Lungs/Pleura: Lungs are clear. No pleural effusion or pneumothorax.

Musculoskeletal: No chest wall abnormality. No acute or significant
osseous findings.

Review of the MIP images confirms the above findings.

CTA ABDOMEN AND PELVIS FINDINGS

VASCULAR

Aorta: Normal caliber aorta without aneurysm, dissection, vasculitis
or significant stenosis.

Celiac: Patent without evidence of aneurysm, dissection, vasculitis
or significant stenosis.

SMA: Patent without evidence of aneurysm, dissection, vasculitis or
significant stenosis.

Renals: Both renal arteries are patent without evidence of aneurysm,
dissection, vasculitis, fibromuscular dysplasia or significant
stenosis.

IMA: Patent without evidence of aneurysm, dissection, vasculitis or
significant stenosis.

Inflow: Patent without evidence of aneurysm, dissection, vasculitis
or significant stenosis.

Veins: No obvious venous abnormality within the limitations of this
arterial phase study.

Review of the MIP images confirms the above findings.

NON-VASCULAR

Hepatobiliary: Stable left hepatic cyst is noted. No gallstones are
noted. No biliary dilatation is noted.

Pancreas: Unremarkable. No pancreatic ductal dilatation or
surrounding inflammatory changes.

Spleen: Normal in size without focal abnormality.

Adrenals/Urinary Tract: Adrenal glands appear normal. No
hydronephrosis or renal obstruction is noted. No renal or ureteral
calculi are noted. Urinary bladder is unremarkable. Interval
development of 2 cm exophytic abnormality arising from midpole of
right kidney concerning for cyst or possible mass. Renal ultrasound
is recommended for further evaluation. Left kidney appears normal.

Stomach/Bowel: The stomach appears normal. There is no evidence of
bowel obstruction or inflammation. The appendix is not visualized.

Lymphatic: No significant adenopathy is noted.

Reproductive: Stable mild prostatic enlargement.

Other: No abdominal wall hernia or abnormality. No abdominopelvic
ascites.

Musculoskeletal: No acute or significant osseous findings.

Review of the MIP images confirms the above findings.
IMPRESSION: No evidence of thoracic or abdominal aortic dissection or aneurysm.

No significant mesenteric or renal artery stenosis is noted.

2 cm exophytic abnormality is seen arising from midpole of right
kidney which may simply represent cyst, but renal ultrasound is
recommended to rule out possible mass or neoplasm.

Stable mild prostatic enlargement.

## 2018-04-20 MED ORDER — IOPAMIDOL (ISOVUE-300) INJECTION 61%
75.0000 mL | Freq: Once | INTRAVENOUS | Status: AC | PRN
  Administered 2018-04-20: 75 mL via INTRAVENOUS

## 2018-04-21 ENCOUNTER — Other Ambulatory Visit: Payer: Self-pay | Admitting: *Deleted

## 2018-04-21 DIAGNOSIS — N2889 Other specified disorders of kidney and ureter: Secondary | ICD-10-CM

## 2018-04-23 ENCOUNTER — Encounter (HOSPITAL_COMMUNITY)
Admission: RE | Admit: 2018-04-23 | Discharge: 2018-04-23 | Disposition: A | Discharge status: Home / Self Care | Payer: Medicare Other | Source: Ambulatory Visit | Attending: Thoracic Surgery (Cardiothoracic Vascular Surgery) | Admitting: Thoracic Surgery (Cardiothoracic Vascular Surgery)

## 2018-04-23 ENCOUNTER — Encounter (HOSPITAL_COMMUNITY): Payer: Self-pay

## 2018-04-23 ENCOUNTER — Ambulatory Visit (HOSPITAL_BASED_OUTPATIENT_CLINIC_OR_DEPARTMENT_OTHER)
Admission: RE | Admit: 2018-04-23 | Discharge: 2018-04-23 | Disposition: A | Discharge status: Home / Self Care | Payer: Medicare Other | Source: Ambulatory Visit | Attending: Thoracic Surgery (Cardiothoracic Vascular Surgery) | Admitting: Thoracic Surgery (Cardiothoracic Vascular Surgery)

## 2018-04-23 ENCOUNTER — Ambulatory Visit (HOSPITAL_COMMUNITY)
Admission: RE | Admit: 2018-04-23 | Discharge: 2018-04-23 | Disposition: A | Discharge status: Home / Self Care | Payer: Medicare Other | Source: Ambulatory Visit | Attending: Thoracic Surgery (Cardiothoracic Vascular Surgery) | Admitting: Thoracic Surgery (Cardiothoracic Vascular Surgery)

## 2018-04-23 ENCOUNTER — Other Ambulatory Visit: Payer: Self-pay

## 2018-04-23 ENCOUNTER — Ambulatory Visit
Admission: RE | Admit: 2018-04-23 | Discharge: 2018-04-23 | Disposition: A | Discharge status: Home / Self Care | Payer: Medicare Other | Source: Ambulatory Visit | Attending: Thoracic Surgery (Cardiothoracic Vascular Surgery) | Admitting: Thoracic Surgery (Cardiothoracic Vascular Surgery)

## 2018-04-23 DIAGNOSIS — R001 Bradycardia, unspecified: Secondary | ICD-10-CM | POA: Insufficient documentation

## 2018-04-23 DIAGNOSIS — G5621 Lesion of ulnar nerve, right upper limb: Secondary | ICD-10-CM | POA: Insufficient documentation

## 2018-04-23 DIAGNOSIS — I340 Nonrheumatic mitral (valve) insufficiency: Secondary | ICD-10-CM | POA: Insufficient documentation

## 2018-04-23 DIAGNOSIS — J449 Chronic obstructive pulmonary disease, unspecified: Secondary | ICD-10-CM | POA: Insufficient documentation

## 2018-04-23 DIAGNOSIS — G5622 Lesion of ulnar nerve, left upper limb: Secondary | ICD-10-CM | POA: Diagnosis not present

## 2018-04-23 DIAGNOSIS — N2889 Other specified disorders of kidney and ureter: Secondary | ICD-10-CM | POA: Diagnosis not present

## 2018-04-23 DIAGNOSIS — R918 Other nonspecific abnormal finding of lung field: Secondary | ICD-10-CM | POA: Diagnosis not present

## 2018-04-23 DIAGNOSIS — Z01818 Encounter for other preprocedural examination: Secondary | ICD-10-CM | POA: Insufficient documentation

## 2018-04-23 LAB — PULMONARY FUNCTION TEST
DL/VA % pred: 84 %
DL/VA: 3.97 ml/min/mmHg/L
DLCO UNC % PRED: 72 %
DLCO unc: 25.33 ml/min/mmHg
FEF 25-75 PRE: 2.42 L/s
FEF 25-75 Post: 3.29 L/sec
FEF2575-%Change-Post: 35 %
FEF2575-%PRED-PRE: 90 %
FEF2575-%Pred-Post: 123 %
FEV1-%CHANGE-POST: 8 %
FEV1-%Pred-Post: 96 %
FEV1-%Pred-Pre: 89 %
FEV1-PRE: 3.13 L
FEV1-Post: 3.39 L
FEV1FVC-%Change-Post: 2 %
FEV1FVC-%PRED-PRE: 101 %
FEV6-%Change-Post: 7 %
FEV6-%PRED-POST: 97 %
FEV6-%Pred-Pre: 90 %
FEV6-Post: 4.4 L
FEV6-Pre: 4.09 L
FEV6FVC-%Change-Post: 1 %
FEV6FVC-%PRED-POST: 104 %
FEV6FVC-%Pred-Pre: 103 %
FVC-%Change-Post: 6 %
FVC-%Pred-Post: 93 %
FVC-%Pred-Pre: 88 %
FVC-PRE: 4.2 L
FVC-Post: 4.45 L
POST FEV1/FVC RATIO: 76 %
PRE FEV6/FVC RATIO: 97 %
Post FEV6/FVC ratio: 99 %
Pre FEV1/FVC ratio: 75 %
RV % pred: 102 %
RV: 2.63 L
TLC % PRED: 94 %
TLC: 7.04 L

## 2018-04-23 LAB — COMPREHENSIVE METABOLIC PANEL
ALBUMIN: 3.7 g/dL (ref 3.5–5.0)
ALK PHOS: 49 U/L (ref 38–126)
ALT: 16 U/L (ref 0–44)
ANION GAP: 8 (ref 5–15)
AST: 17 U/L (ref 15–41)
BILIRUBIN TOTAL: 0.8 mg/dL (ref 0.3–1.2)
BUN: 17 mg/dL (ref 8–23)
CALCIUM: 8.9 mg/dL (ref 8.9–10.3)
CO2: 19 mmol/L — ABNORMAL LOW (ref 22–32)
CREATININE: 0.9 mg/dL (ref 0.61–1.24)
Chloride: 112 mmol/L — ABNORMAL HIGH (ref 98–111)
GFR calc Af Amer: >60 mL/min (ref >60)
GFR calc non Af Amer: >60 mL/min (ref >60)
GLUCOSE: 103 mg/dL — AB (ref 70–99)
Potassium: 3.7 mmol/L (ref 3.5–5.1)
Sodium: 139 mmol/L (ref 135–145)
TOTAL PROTEIN: 6.1 g/dL — AB (ref 6.5–8.1)

## 2018-04-23 LAB — CBC
HEMATOCRIT: 43.9 % (ref 39.0–52.0)
HEMOGLOBIN: 14.8 g/dL (ref 13.0–17.0)
MCH: 30.3 pg (ref 26.0–34.0)
MCHC: 33.7 g/dL (ref 30.0–36.0)
MCV: 90 fL (ref 80.0–100.0)
Platelets: 217 10*3/uL (ref 150–400)
RBC: 4.88 MIL/uL (ref 4.22–5.81)
RDW: 13.1 % (ref 11.5–15.5)
WBC: 6.2 10*3/uL (ref 4.0–10.5)
nRBC: 0 % (ref 0.0–0.2)

## 2018-04-23 LAB — SURGICAL PCR SCREEN
MRSA, PCR: NEGATIVE
STAPHYLOCOCCUS AUREUS: POSITIVE — AB

## 2018-04-23 LAB — URINALYSIS, ROUTINE W REFLEX MICROSCOPIC
Bilirubin Urine: NEGATIVE
Glucose, UA: NEGATIVE mg/dL
Hgb urine dipstick: NEGATIVE
KETONES UR: NEGATIVE mg/dL
LEUKOCYTES UA: NEGATIVE
NITRITE: NEGATIVE
PH: 5 (ref 5.0–8.0)
PROTEIN: NEGATIVE mg/dL
Specific Gravity, Urine: 1.014 (ref 1.005–1.030)

## 2018-04-23 LAB — BLOOD GAS, ARTERIAL
Acid-base deficit: 1.3 mmol/L (ref 0.0–2.0)
BICARBONATE: 21.8 mmol/L (ref 20.0–28.0)
DRAWN BY: 421801
FIO2: 21
O2 Saturation: 98.7 %
PCO2 ART: 29.3 mmHg — AB (ref 32.0–48.0)
PH ART: 7.484 — AB (ref 7.350–7.450)
Patient temperature: 98.6
pO2, Arterial: 113 mmHg — ABNORMAL HIGH (ref 83.0–108.0)

## 2018-04-23 LAB — ABO/RH: ABO/RH(D): A POS

## 2018-04-23 LAB — PROTIME-INR
INR: 1
PROTHROMBIN TIME: 13.1 s (ref 11.4–15.2)

## 2018-04-23 LAB — TYPE AND SCREEN
ABO/RH(D): A POS
ANTIBODY SCREEN: NEGATIVE

## 2018-04-23 LAB — APTT: aPTT: 34 seconds (ref 24–36)

## 2018-04-23 LAB — HEMOGLOBIN A1C
Hgb A1c MFr Bld: 5 % (ref 4.8–5.6)
Mean Plasma Glucose: 96.8 mg/dL

## 2018-04-23 MED ORDER — ALBUTEROL SULFATE (2.5 MG/3ML) 0.083% IN NEBU
2.5000 mg | INHALATION_SOLUTION | Freq: Once | RESPIRATORY_TRACT | Status: AC
  Administered 2018-04-23: 2.5 mg via RESPIRATORY_TRACT

## 2018-04-23 NOTE — Progress Notes (Signed)
PCP - Dr. Brayton Layman  Cardiologist - Dr. Ashok Norris  Chest x-ray - 04/23/18  EKG - 04/23/18  Stress Test - Denies  ECHO - 03/03/18 (E)  Cardiac Cath - 03/24/18 (E)  AICD- n/a PM- n/a LOOP- n/a  Sleep Study - Denies CPAP - None  LABS- 04/23/18: CBC, CMP, PT, PTT, ABG, T/S, PCR  ASA- Denies Eliquis- LD- 04/19/18  HA1C- 04/23/18  Anesthesia- Yes- cardiac history  Pt denies having chest pain, sob, or fever at this time. All instructions explained to the pt, with a verbal understanding of the material. Pt agrees to go over the instructions while at home for a better understanding. The opportunity to ask questions was provided.

## 2018-04-23 NOTE — Pre-Procedure Instructions (Signed)
[name]  04/23/2018      [id number] - [place], [place] - [address] [place] [address] Phone: [phone] Fax: [phone]    Your procedure is scheduled on Tues. Oct. 29, 2019 from 7:30AM-3:00PM  Report to [facility] Admitting Entrance "A" at 5:30AM  Call this number if you have problems the morning of surgery:  [phone]   Remember:  Do not eat or drink after midnight on Oct. 28th    Take these medicines the morning of surgery with A SIP OF WATER: Carvedilol (COREG) If needed: Acetaminophen (TYLENOL), Eye drops, and Nasal spray  Stop Eliquis 7 days (04/20/18) prior to surgery, per Dr. Roxy Manns.  As of today, stop taking all Other Aspirin Products, Vitamins, Fish oils, and Herbal medications. Also stop all NSAIDS i.e. Advil, Ibuprofen, Motrin, Aleve, Anaprox, Naproxen, BC, Goody Powders, and all Supplements.    Do not wear jewelry.  Do not wear lotions, powders, colognes, or deodorant.  Do not shave 48 hours prior to surgery.  Men may shave face.  Do not bring valuables to the hospital.  [facility] is not responsible for any belongings or valuables.  Contacts, dentures or bridgework may not be worn into surgery.  Leave your suitcase in the car.  After surgery it may be brought to your room.  For patients admitted to the hospital, discharge time will be determined by your treatment team.  Patients discharged the day of surgery will not be allowed to drive home.   Special instructions:  [facility]- Preparing For Surgery  Before surgery, you can play an important role. Because skin is not sterile, your skin needs to be as free of germs as possible. You can reduce the number of germs on your skin by washing with CHG (chlorahexidine gluconate) Soap before surgery.  CHG is an antiseptic cleaner which kills germs and bonds with the skin to continue killing germs even after washing.    Oral Hygiene is also important to  reduce your risk of infection.  Remember - BRUSH YOUR TEETH THE MORNING OF SURGERY WITH YOUR REGULAR TOOTHPASTE  Please do not use if you have an allergy to CHG or antibacterial soaps. If your skin becomes reddened/irritated stop using the CHG.  Do not shave (including legs and underarms) for at least 48 hours prior to first CHG shower. It is OK to shave your face.  Please follow these instructions carefully.   1. Shower the NIGHT BEFORE SURGERY and the MORNING OF SURGERY with CHG.   2. If you chose to wash your hair, wash your hair first as usual with your normal shampoo.  3. After you shampoo, rinse your hair and body thoroughly to remove the shampoo.  4. Use CHG as you would any other liquid soap. You can apply CHG directly to the skin and wash gently with a scrungie or a clean washcloth.   5. Apply the CHG Soap to your body ONLY FROM THE NECK DOWN.  Do not use on open wounds or open sores. Avoid contact with your eyes, ears, mouth and genitals (private parts). Wash Face and genitals (private parts)  with your normal soap.  6. Wash thoroughly, paying special attention to the area where your surgery will be performed.  7. Thoroughly rinse your body with warm water from the neck down.  8. DO NOT shower/wash with your normal soap after using and rinsing off the CHG Soap.  9. [place]  yourself dry with a CLEAN TOWEL.  10. Wear CLEAN PAJAMAS to bed the night before surgery, wear comfortable clothes the morning of surgery  11. Place CLEAN SHEETS on your bed the night of your first shower and DO NOT SLEEP WITH PETS.  Day of Surgery:  Do not apply any deodorants/lotions.  Please wear clean clothes to the hospital/surgery center.   Remember to brush your teeth WITH YOUR REGULAR TOOTHPASTE.  Please read over the following fact sheets that you were given. Pain Booklet, Coughing and Deep Breathing, MRSA Information and Surgical Site Infection Prevention

## 2018-04-23 NOTE — Progress Notes (Signed)
Pre cabg Doppler   Right Carotid:Velocities in the right ICA are consistent with a 1-39% stenosis.  Left Carotid: Velocities in the left ICA are consistent with a 1-39% stenosis.    Right Upper Extremity: Doppler waveforms remain within normal limits with right radial compression. Doppler waveforms remain within normal limits with right ulnar compression.   Left Upper Extremity: Doppler waveforms decrease >50% with left radial compression. Doppler waveforms remain within normal limits with left ulnar compression.     Landry Mellow, RDMS, RVT

## 2018-04-23 NOTE — Progress Notes (Signed)
Mup[facility] [address] in [place], [place] for positive PCR of Staph. Pt notified of results and need to pick up Rx. He voiced understanding.

## 2018-04-26 ENCOUNTER — Other Ambulatory Visit: Payer: Medicare Other

## 2018-04-26 ENCOUNTER — Encounter: Payer: Self-pay | Admitting: Thoracic Surgery (Cardiothoracic Vascular Surgery)

## 2018-04-26 ENCOUNTER — Ambulatory Visit: Payer: Medicare Other | Admitting: Thoracic Surgery (Cardiothoracic Vascular Surgery)

## 2018-04-26 ENCOUNTER — Telehealth: Payer: Self-pay | Admitting: Thoracic Surgery (Cardiothoracic Vascular Surgery)

## 2018-04-26 MED ORDER — SODIUM CHLORIDE 0.9 % IV SOLN
750.0000 mg | INTRAVENOUS | Status: DC
  Filled 2018-04-26: qty 750

## 2018-04-26 MED ORDER — TRANEXAMIC ACID (OHS) BOLUS VIA INFUSION
15.0000 mg/kg | INTRAVENOUS | Status: AC
  Administered 2018-04-27: 1444.5 mg via INTRAVENOUS
  Filled 2018-04-26: qty 1445

## 2018-04-26 MED ORDER — DOPAMINE-DEXTROSE 3.2-5 MG/ML-% IV SOLN
0.0000 ug/kg/min | INTRAVENOUS | Status: DC
  Filled 2018-04-26: qty 250

## 2018-04-26 MED ORDER — KENNESTONE BLOOD CARDIOPLEGIA (KBC) MANNITOL SYRINGE (20%, 32ML)
32.0000 mL | Freq: Once | INTRAVENOUS | Status: DC
  Filled 2018-04-26: qty 32

## 2018-04-26 MED ORDER — SODIUM CHLORIDE 0.9 % IV SOLN
1.5000 g | INTRAVENOUS | Status: AC
  Administered 2018-04-27: 1.5 g via INTRAVENOUS
  Filled 2018-04-26: qty 1.5

## 2018-04-26 MED ORDER — NOREPINEPHRINE 4 MG/250ML-% IV SOLN
0.0000 ug/min | INTRAVENOUS | Status: DC
  Filled 2018-04-26: qty 250

## 2018-04-26 MED ORDER — EPINEPHRINE PF 1 MG/ML IJ SOLN
0.0000 ug/min | INTRAVENOUS | Status: DC
  Filled 2018-04-26: qty 4

## 2018-04-26 MED ORDER — MAGNESIUM SULFATE 50 % IJ SOLN
40.0000 meq | INTRAMUSCULAR | Status: DC
  Filled 2018-04-26: qty 9.85

## 2018-04-26 MED ORDER — MILRINONE LACTATE IN DEXTROSE 20-5 MG/100ML-% IV SOLN
0.3000 ug/kg/min | INTRAVENOUS | Status: DC
  Filled 2018-04-26: qty 100

## 2018-04-26 MED ORDER — TRANEXAMIC ACID (OHS) PUMP PRIME SOLUTION
2.0000 mg/kg | INTRAVENOUS | Status: DC
  Filled 2018-04-26: qty 1.93

## 2018-04-26 MED ORDER — TRANEXAMIC ACID 1000 MG/10ML IV SOLN
1.5000 mg/kg/h | INTRAVENOUS | Status: AC
  Administered 2018-04-27: 1.5 mg/kg/h via INTRAVENOUS
  Filled 2018-04-26: qty 25

## 2018-04-26 MED ORDER — GLUTARALDEHYDE 0.625% SOAKING SOLUTION
TOPICAL | Status: DC
  Filled 2018-04-26: qty 50

## 2018-04-26 MED ORDER — KENNESTONE BLOOD CARDIOPLEGIA VIAL
13.0000 mL | Freq: Once | Status: DC
  Filled 2018-04-26: qty 13

## 2018-04-26 MED ORDER — SODIUM CHLORIDE 0.9 % IV SOLN
INTRAVENOUS | Status: DC
  Filled 2018-04-26: qty 30

## 2018-04-26 MED ORDER — POTASSIUM CHLORIDE 2 MEQ/ML IV SOLN
80.0000 meq | INTRAVENOUS | Status: DC
  Filled 2018-04-26: qty 40

## 2018-04-26 MED ORDER — PHENYLEPHRINE HCL-NACL 20-0.9 MG/250ML-% IV SOLN
30.0000 ug/min | INTRAVENOUS | Status: DC
  Filled 2018-04-26: qty 250

## 2018-04-26 MED ORDER — VANCOMYCIN HCL 1000 MG IV SOLR
INTRAVENOUS | Status: AC
  Administered 2018-04-27: 1000 mL
  Filled 2018-04-26: qty 1000

## 2018-04-26 MED ORDER — PLASMA-LYTE 148 IV SOLN
INTRAVENOUS | Status: DC
  Filled 2018-04-26: qty 2.5

## 2018-04-26 MED ORDER — VANCOMYCIN HCL 10 G IV SOLR
1500.0000 mg | INTRAVENOUS | Status: AC
  Administered 2018-04-27: 1500 mg via INTRAVENOUS
  Filled 2018-04-26: qty 1500

## 2018-04-26 MED ORDER — NITROGLYCERIN IN D5W 200-5 MCG/ML-% IV SOLN
2.0000 ug/min | INTRAVENOUS | Status: AC
  Administered 2018-04-27: 10 ug/min via INTRAVENOUS
  Filled 2018-04-26: qty 250

## 2018-04-26 MED ORDER — INSULIN REGULAR(HUMAN) IN NACL 100-0.9 UT/100ML-% IV SOLN
INTRAVENOUS | Status: DC
  Filled 2018-04-26: qty 100

## 2018-04-26 MED ORDER — DEXMEDETOMIDINE HCL IN NACL 400 MCG/100ML IV SOLN
0.1000 ug/kg/h | INTRAVENOUS | Status: AC
  Administered 2018-04-27: .5 ug/kg/h via INTRAVENOUS
  Administered 2018-04-27: 14:00:00 via INTRAVENOUS
  Filled 2018-04-26: qty 100

## 2018-04-26 NOTE — Anesthesia Preprocedure Evaluation (Addendum)
Anesthesia Evaluation  Patient identified by MRN, date of birth, ID band Patient awake    Reviewed: Allergy & Precautions, H&P , NPO status , Patient's Chart, lab work & pertinent test results  Airway Mallampati: II  TM Distance: >3 FB Neck ROM: Full    Dental no notable dental hx. (+) Teeth Intact, Dental Advisory Given   Pulmonary neg pulmonary ROS,    Pulmonary exam normal breath sounds clear to auscultation       Cardiovascular Exercise Tolerance: Good hypertension, Pt. on medications and Pt. on home beta blockers + dysrhythmias Atrial Fibrillation + Valvular Problems/Murmurs MR  Rhythm:Regular Rate:Normal     Neuro/Psych negative neurological ROS  negative psych ROS   GI/Hepatic negative GI ROS, Neg liver ROS,   Endo/Other  negative endocrine ROS  Renal/GU negative Renal ROS  negative genitourinary   Musculoskeletal   Abdominal   Peds  Hematology negative hematology ROS (+)   Anesthesia Other Findings   Reproductive/Obstetrics negative OB ROS                            Anesthesia Physical Anesthesia Plan  ASA: IV  Anesthesia Plan: General   Post-op Pain Management:    Induction: Intravenous  PONV Risk Score and Plan: 2 and Midazolam and Treatment may vary due to age or medical condition  Airway Management Planned: Double Lumen EBT  Additional Equipment: Arterial line, CVP, PA Cath, TEE, 3D TEE and Ultrasound Guidance Line Placement  Intra-op Plan:   Post-operative Plan: Post-operative intubation/ventilation  Informed Consent: I have reviewed the patients History and Physical, chart, labs and discussed the procedure including the risks, benefits and alternatives for the proposed anesthesia with the patient or authorized representative who has indicated his/her understanding and acceptance.   Dental advisory given  Plan Discussed with: CRNA  Anesthesia Plan Comments:          Anesthesia Quick Evaluation

## 2018-04-26 NOTE — Telephone Encounter (Signed)
I personally called Mr Chase Smith to make certain that he has not developed any new problems since he was seen in the office.  We discussed the results of all of his routine preoperative blood work as well as the results of his recent CT angiogram.  He is looking forward to proceeding with surgery as originally planned.  He stopped taking Eliquis last week.  All of his questions have been addressed.  He has previously scheduled appointment in our office today is unnecessary and has been canceled.  Rexene Alberts, MD 04/26/2018 12:51 PM

## 2018-04-26 NOTE — H&P (Signed)
[address]       [place] [address]             [phone]          CARDIOTHORACIC SURGERY HISTORY AND PHYSICAL EXAM  Referring Provider is Sherren Mocha, MD  Primary Cardiologist is Sueanne Margarita, MD PCP is Josetta Huddle, MD      Chief Complaint  Patient presents with  . Mitral Regurgitation    Surgical eval, ECHO 03/03/18, Cardiac Cath 03/24/18, TEE 01/05/18    HPI:  Patient is a 72 year old male with history of mitral valve prolapse with mitral regurgitation, recurrent paroxysmal atrial fibrillation and hypertension who has been referred for surgical consultation to discuss treatment options for management of severe symptomatic primary mitral regurgitation with recurrent paroxysmal atrial fibrillation.  Patient states that he was first noted to have a heart murmur on physical exam in 1995.  He was followed for many years by his primary care physician, and more recently he has been followed by Dr. Radford Pax.  Previous echocardiograms have documented the presence of mitral valve prolapse with what was felt to be moderate mitral regurgitation.  Several years ago he began to experience episodes of tachycardia palpitations and shortness of breath.  He was diagnosed with paroxysmal atrial fibrillation.  He has been anticoagulated using Eliquis and for the most part he remains in sinus rhythm.  The patient was hospitalized in July 2019 with Streptococcus viridans bacteremia.  Transesophageal echocardiogram performed at that time demonstrated the presence of mitral valve prolapse with severe mitral regurgitation.  No definite vegetation was identified.  The patient was treated for presumed bacterial endocarditis with a 6-week course of intravenous antibiotics.  Symptoms of myalgias, fevers, night sweats, and headaches all resolved.  He was evaluated by his dentist as an outpatient and noted to have a root abscess requiring tooth extraction and a second problematic  tooth requiring root canal.  These were performed uneventfully.  The patient was subsequently referred to Dr. Burt Knack for consultation.  He subsequently underwent left and right heart catheterization on March 24, 2018.  Catheterization confirmed the presence of severe mitral regurgitation.  The patient was noted to have normal coronary arteries with no significant coronary artery disease.  Right heart pressures and left ventricular end-diastolic pressure was normal.  The patient was referred for surgical consultation.  Patient is married and lives with his wife in [place], [place] in a retirement home that the patient built several years ago.  He has remained fairly active physically.  He enjoys doing numerous projects on his house or in his yard.  He walks on a regular basis, albeit at a moderate pace.  He states that he gets short of breath going up hills.  He reports occasional palpitations associated with shortness of breath that seem to correlate with episodes of paroxysmal atrial fibrillation.  He thinks these episodes occur 2 or 3 times monthly.  He has not had any chest pain or chest tightness.  He denies any resting shortness of breath, PND, orthopnea, or lower extremity edema.   Past Medical History:  Diagnosis Date  . Abdominal mass, RLQ (right lower quadrant) 07/27/2012  . Benign essential HTN 02/07/2015  . BPH (benign prostatic hyperplasia)    mild  . Dilated aortic root (HCC)    34 mm by echo 12/2017  . ED (erectile dysfunction) 08/2011   cancelled f/u with Urology  . Epididymitis    recurrent  . Hypertension  in the past - resolved  . Left ventricular diastolic dysfunction, NYHA class 2   . Meckel's diverticulitis 08/27/2012  . Mitral valve regurgitation   . Moderate mitral regurgitation 08/08/2013  . MVP (mitral valve prolapse)    Bileaflet MVP with mild to moderate MR by echo 10/2017  . PAF (paroxysmal atrial fibrillation) ([place]) 05/13/2017   CHADS2VASC score is 2 (HTN and age  > 63) on Apixaban  . Vertigo    episodic mild positional    Past Surgical History:  Procedure Laterality Date  . APPENDECTOMY    . CARDIAC CATHETERIZATION  03/24/2018  . LAPAROSCOPIC APPENDECTOMY N/A 08/11/2012   Procedure: APPENDECTOMY LAPAROSCOPIC;  Surgeon: Stark Klein, MD;  Location: [facility];  Service: General;  Laterality: N/A;  . LAPAROSCOPIC SMALL BOWEL RESECTION N/A 08/11/2012   Procedure: Diagnostic Laparoscopy,  Diverticulectomy and laparoscopic appendectomy;  Surgeon: Stark Klein, MD;  Location: [facility];  Service: General;  Laterality: N/A;  Diagnostic Laparoscopy, Small Bowel Resection vs Diverticulectomy   . PILONIDAL CYST EXCISION    . RECONSTRUCTION OF NOSE     secondary to deviated septum  . RIGHT/LEFT HEART CATH AND CORONARY ANGIOGRAPHY N/A 03/24/2018   Procedure: RIGHT/LEFT HEART CATH AND CORONARY ANGIOGRAPHY;  Surgeon: Jettie Booze, MD;  Location: [facility];  Service: Cardiovascular;  Laterality: N/A;  . TEE WITHOUT CARDIOVERSION N/A 08/08/2013   Procedure: TRANSESOPHAGEAL ECHOCARDIOGRAM (TEE);  Surgeon: Sueanne Margarita, MD;  Location: [facility];  Service: Cardiovascular;  Laterality: N/A;  . TEE WITHOUT CARDIOVERSION N/A 01/05/2018   Procedure: TRANSESOPHAGEAL ECHOCARDIOGRAM (TEE);  Surgeon: Lelon Perla, MD;  Location: [facility] ENDOSCOPY;  Service: Cardiovascular;  Laterality: N/A;  . TONSILLECTOMY      Family History  Problem Relation Age of Onset  . Cancer Mother        breast  . Cancer Father        pancreatic  . Heart failure Father   . Cancer Maternal Aunt        ? ovarian  . Cancer Maternal Uncle        ? pancreatic  . Cancer Paternal Uncle        ? lung    Social History Social History   Tobacco Use  . Smoking status: Never Smoker  . Smokeless tobacco: Never Used  Substance Use Topics  . Alcohol use: Yes    Comment: one drink per day (wine or beer)  . Drug use: No    Prior to Admission medications   Medication Sig Start Date  End Date Taking? Authorizing Provider  acetaminophen (TYLENOL) 500 MG tablet Take 500-1,000 mg by mouth every 6 (six) hours as needed for moderate pain or headache.   Yes [provider]  amoxicillin (AMOXIL) 500 MG tablet Take 4 tablets by mouth 30-60 minutes prior to dental procedures Patient taking differently: Take 2,000 mg by mouth See admin instructions. Take 2000 mg by mouth 30-60 minutes prior to dental procedures 03/22/18  Yes Dunn, Dayna N, PA-C  carvedilol (COREG) 6.25 MG tablet Take 1 tablet (6.25 mg total) by mouth 2 (two) times daily with a meal. 04/15/18  Yes Turner, Eber Hong, MD  Cholecalciferol (VITAMIN D) 2000 UNITS tablet Take 2,000 Units daily by mouth.    Yes [provider]  Coenzyme Q10 (CO Q-10 PO) Take 1 capsule by mouth daily.   Yes [provider]  Cyanocobalamin (VITAMIN B12) 500 MCG TABS Take 500 mcg by mouth daily.    Yes [provider]  ELIQUIS 5 MG TABS tablet Take 1 tablet (5 mg total) by mouth 2 (two) times daily. 03/25/18  Yes Jettie Booze, MD  flunisolide (NASALIDE) 25 MCG/ACT (0.025%) SOLN Place 2 sprays into the nose daily as needed (for allergies).    Yes [provider]  losartan (COZAAR) 50 MG tablet Take 25 mg by mouth daily.    Yes [provider]  Polyvinyl Alcohol-Povidone PF (REFRESH) 1.4-0.6 % SOLN Place 1 drop into both eyes 3 (three) times daily as needed (for dry eyes).   Yes [provider]  rosuvastatin (CRESTOR) 5 MG tablet Take 5 mg by mouth every Monday, Wednesday, and Friday.  03/10/17  Yes [provider]    Allergies  Allergen Reactions  . Ace Inhibitors Cough  . Sulfa Antibiotics Other (See Comments)    Severe lethargy  . Antihistamines, Chlorpheniramine-Type Other (See Comments)    Trouble urinating. Patient st "all antihistamines" cause this.       Review of Systems:              General:                      normal appetite, normal energy, no weight  gain, no weight loss, no fever             Cardiac:                       no chest pain with exertion, no chest pain at rest, +SOB with exertion, no resting SOB, no PND, no orthopnea, + palpitations, + arrhythmia, + atrial fibrillation, no LE edema, no dizzy spells, no syncope             Respiratory:                 no shortness of breath, no home oxygen, no productive cough, no dry cough, no bronchitis, no wheezing, no hemoptysis, no asthma, no pain with inspiration or cough, no sleep apnea, no CPAP at night             GI:                               no difficulty swallowing, no reflux, no frequent heartburn, no hiatal hernia, no abdominal pain, no constipation, no diarrhea, no hematochezia, no hematemesis, no melena             GU:                              no dysuria,  no frequency, no urinary tract infection, no hematuria, no enlarged prostate, no kidney stones, no kidney disease             Vascular:                     no pain suggestive of claudication, no pain in feet, no leg cramps, no varicose veins, no DVT, no non-healing foot ulcer             Neuro:                         no stroke, no TIA's, no seizures, no headaches, no temporary blindness one eye,  no slurred speech, no peripheral neuropathy, no chronic pain, no instability of  gait, no memory/cognitive dysfunction             Musculoskeletal:         no arthritis, no joint swelling, no myalgias, no difficulty walking, normal mobility              Skin:                            no rash, no itching, no skin infections, no pressure sores or ulcerations             Psych:                         no anxiety, no depression, no nervousness, no unusual recent stress             Eyes:                           no blurry vision, no floaters, no recent vision changes, + wears glasses or contacts             ENT:                            + hearing loss, no loose or painful teeth, no dentures, last saw dentist within the past 2 months              Hematologic:               no easy bruising, no abnormal bleeding, no clotting disorder, no frequent epistaxis             Endocrine:                   no diabetes, does not check CBG's at home                           Physical Exam:              BP 121/75   Pulse 62   Resp 20   Ht 6' (1.829 m)   Wt 208 lb (94.3 kg)   SpO2 98% Comment: RA  BMI 28.21 kg/m              General:                      Thin,  well-appearing             HEENT:                       Unremarkable              Neck:                           no JVD, no bruits, no adenopathy              Chest:                          clear to auscultation, symmetrical breath sounds, no wheezes, no rhonchi              CV:  RRR, grade III/VI holosystolic murmur best at apex             Abdomen:                    soft, non-tender, no masses              Extremities:                 warm, well-perfused, pulses palpable, no LE edema             Rectal/GU                   Deferred             Neuro:                         Grossly non-focal and symmetrical throughout             Skin:                            Clean and dry, no rashes, no breakdown   Diagnostic Tests:  Transesophageal Echocardiography  Patient: Kenneith, Stief MR #: [id number] Study Date: 01/05/2018 Gender: M Age: 6 Height: 182.9 cm Weight: 93.2 kg BSA: 2.19 m^2 Pt. Status: Room: 1602  PERFORMING Kirk Ruths ADMITTING Thurman Coyer, [place], [place] M ATTENDING Dessa Phi Chahn-Yang SONOGRAPHER Jannett Celestine, RDCS  cc:  ------------------------------------------------------------------- LV EF: 55% - 60%  ------------------------------------------------------------------- History: PMH: Bacteremia Evaluation 790.7. Risk factors: MVP. Paroxysmal A-FIb.  Hypertension.  ------------------------------------------------------------------- Study Conclusions  - Left ventricle: Systolic function was normal. The estimated ejection fraction was in the range of 55% to 60%. Wall motion was normal; there were no regional wall motion abnormalities. - Aortic valve: No evidence of vegetation. There was mild regurgitation. - Mitral valve: Moderate thickening. Severe prolapse, involving the posterior leaflet. There was severe regurgitation directed eccentrically, anteriorly, and toward the septum. - Left atrium: The atrium was moderately dilated. - Right atrium: The atrium was moderately dilated. - Atrial septum: No defect or patent foramen ovale was identified. - Tricuspid valve: No evidence of vegetation. - Pulmonic valve: No evidence of vegetation.  Impressions:  - Normal LV function; biatrial enlargement; sclerotic aortic valve with mild AI; severe prolapse of posterior MV leaflet; linear oscillating density noted (possible rupture chord; cannot R/O small vegetation); severe, eccentric MR; mild TR and PI.  ------------------------------------------------------------------- Study data: Study status: Routine. Consent: The risks, benefits, and alternatives to the procedure were explained to the patient and informed consent was obtained. Procedure: The patient reported no pain pre or post test. Initial setup. The patient was brought to the laboratory. Surface ECG leads were monitored. Sedation. Sedation was administered by anesthesiology staff. Transesophageal echocardiography. Topical anesthesia was obtained using viscous lidocaine. A transesophageal probe was inserted by the attending cardiologistwithout difficulty. Image quality was adequate. Study completion: The patient tolerated the procedure well. There were no complications. Diagnostic transesophageal echocardiography. 2D and color  Doppler. Birthdate: Patient birthdate: [birth year]. Age: Patient is 72 yr old. Sex: Gender: male. BMI: 27.9 kg/m^2. Blood pressure: 108/59 Patient status: Inpatient. Study date: Study date: 01/05/2018. Study time: 11:02 AM. Location: Endoscopy.  -------------------------------------------------------------------  ------------------------------------------------------------------- Left ventricle: Systolic function was normal. The estimated ejection fraction was in the range of 55% to 60%. Wall motion was normal; there were no regional wall motion abnormalities.  -------------------------------------------------------------------  Aortic valve: Mildly thickened leaflets. Cusp separation was normal. No evidence of vegetation. Doppler: There was mild regurgitation.  ------------------------------------------------------------------- Aorta: Descending aorta: The descending aorta had mild diffuse disease.  ------------------------------------------------------------------- Mitral valve: Moderate thickening. Severe prolapse, involving the posterior leaflet. Doppler: There was severe regurgitation directed eccentrically, anteriorly, and toward the septum.  ------------------------------------------------------------------- Left atrium: The atrium was moderately dilated.  ------------------------------------------------------------------- Atrial septum: No defect or patent foramen ovale was identified.  ------------------------------------------------------------------- Right ventricle: The cavity size was normal. Systolic function was normal.  ------------------------------------------------------------------- Pulmonic valve: Structurally normal valve. Cusp separation was normal. No evidence of vegetation. Doppler: There was mild regurgitation.  ------------------------------------------------------------------- Tricuspid valve:  Structurally normal valve. Leaflet separation was normal. No evidence of vegetation. Doppler: There was mild regurgitation.  ------------------------------------------------------------------- Right atrium: The atrium was moderately dilated.  ------------------------------------------------------------------- Pericardium: There was no pericardial effusion.  ------------------------------------------------------------------- Prepared and Electronically Authenticated by  Kirk Ruths 2019-07-09T14:53:26   Echocardiography  Patient: Treyton, Slimp MR #: [id number] Study Date: 03/03/2018 Gender: M Age: 18 Height: 182.9 cm Weight: 94.2 kg BSA: 2.2 m^2 Pt. Status: Room:  ATTENDING Fransico Him, MD ORDERING Fransico Him, MD [place], MD SONOGRAPHER Victorio Palm, RDCS PERFORMING [facility] Outpatient  cc:  ------------------------------------------------------------------- LV EF: 60% - 65%  ------------------------------------------------------------------- Indications: (I34.0).  ------------------------------------------------------------------- History: PMH: Acquired from the patient and from the patient&'s chart. Paroxysmal atrial fibrillation. Severe mitral valve prolapse. Severe mitral regurgitation secondary to posterior leaflet mitral valve prolapse with possible rupture of chordae tendonae in the setting of possible strep viridans. Risk factors: Hypertension.  ------------------------------------------------------------------- Study Conclusions  - Left ventricle: The cavity size was normal. Wall thickness was increased in a pattern of mild LVH. Systolic function was normal. The estimated ejection fraction was in the range of 60% to 65%. Doppler parameters are consistent with both elevated ventricular end-diastolic filling pressure and elevated left  atrial filling pressure. - Mitral valve: Probable flail segment to posterior leaflet with anteriorly directed MR>Only appears moderate by this current TTE but morphologically suggests its severe. There was moderate regurgitation. - Atrial septum: No defect or patent foramen ovale was identified.  ------------------------------------------------------------------- Labs, prior tests, procedures, and surgery: Transesophageal echocardiography (July 2019). The mitral valve showed severe regurgitation with sever mitral valve prolapse. The aortic valve showed mild regurgitation. EF was 60%.  ------------------------------------------------------------------- Study data: Comparison was made to the study of July 2019. Study status: Routine. Procedure: The patient reported no pain pre or post test. Transthoracic echocardiography for diagnosis, for left ventricular function evaluation, for right ventricular function evaluation, and for assessment of valvular function. Image quality was adequate. Echocardiography. M-mode, complete 2D, spectral Doppler, and color Doppler. Birthdate: Patient birthdate: 01-09-1946. Age: Patient is 72 yr old. Sex: Gender: male. BMI: 28.1 kg/m^2. Blood pressure: 118/80 Patient status: Outpatient. Study date: Study date: 03/03/2018. Study time: 10:30 AM. Location: [facility]  -------------------------------------------------------------------  ------------------------------------------------------------------- Left ventricle: The cavity size was normal. Wall thickness was increased in a pattern of mild LVH. Systolic function was normal. The estimated ejection fraction was in the range of 60% to 65%. Doppler parameters are consistent with both elevated ventricular end-diastolic filling pressure and elevated left atrial  filling pressure.  ------------------------------------------------------------------- Aortic valve: Trileaflet; mildly thickened, mildly calcified leaflets. Doppler: There was no stenosis.  ------------------------------------------------------------------- Aorta: The aorta was normal, not dilated, and non-diseased. Ascending aorta: The ascending aorta was mildly dilated.  ------------------------------------------------------------------- Mitral valve: Probable flail segment to posterior leaflet with anteriorly directed MR> Only appears moderate by this current TTE but  morphologically suggests its severe. Doppler: There was moderate regurgitation. Peak gradient (D): 4 mm Hg.  ------------------------------------------------------------------- Left atrium: The atrium was at the upper limits of normal in size.  ------------------------------------------------------------------- Atrial septum: No defect or patent foramen ovale was identified.  ------------------------------------------------------------------- Right ventricle: The cavity size was normal. Wall thickness was normal. Systolic function was normal.  ------------------------------------------------------------------- Pulmonic valve: Doppler: There was mild regurgitation.  ------------------------------------------------------------------- Tricuspid valve: Doppler: There was mild regurgitation.  ------------------------------------------------------------------- Right atrium: The atrium was normal in size.  ------------------------------------------------------------------- Pericardium: The pericardium was normal in appearance.  ------------------------------------------------------------------- Systemic veins: Inferior vena cava: The vessel was normal in size. The respirophasic diameter changes were in the normal range (>= 50%), consistent with normal central venous  pressure.  ------------------------------------------------------------------- Post procedure conclusions Ascending Aorta:  - The aorta was normal, not dilated, and non-diseased.  ------------------------------------------------------------------- Measurements  Left ventricle Value Reference LV ID, ED, PLAX chordal 46.6 mm 43 - 52 LV ID, ES, PLAX chordal 29.3 mm 23 - 38 LV fx shortening, PLAX chordal 37 % >=29 LV PW thickness, ED 13.8 mm --------- IVS/LV PW ratio, ED 1 <=1.3 Stroke volume, 2D 98 ml --------- Stroke volume/bsa, 2D 44 ml/m^2 --------- LV e&', lateral 5.77 cm/s --------- LV E/e&', lateral 18.2 --------- LV e&', medial 5.87 cm/s --------- LV E/e&', medial 17.89 --------- LV e&', average 5.82 cm/s --------- LV E/e&', average 18.04 ---------  Ventricular septum Value Reference IVS thickness, ED 13.8 mm ---------  LVOT Value Reference LVOT ID, S 25 mm --------- LVOT area 4.91 cm^2 --------- LVOT ID 25 mm --------- LVOT peak velocity, S 89.3 cm/s --------- LVOT mean velocity, S 60.5 cm/s --------- LVOT VTI, S 20 cm --------- Stroke volume (SV), LVOT DP 98.2 ml --------- Stroke index (SV/bsa), LVOT DP 44.5 ml/m^2 ---------  Aorta  Value Reference Aortic root ID, ED 38 mm --------- Ascending aorta ID, A-P, S 38 mm ---------  Left atrium Value Reference LA ID, A-P, ES 37 mm --------- LA ID/bsa, A-P 1.68 cm/m^2 <=2.2 LA volume, S 100 ml --------- LA volume/bsa, S 45.4 ml/m^2 --------- LA volume, ES, 1-p A4C 91.8 ml --------- LA volume/bsa, ES, 1-p A4C 41.6 ml/m^2 --------- LA volume, ES, 1-p A2C 107 ml --------- LA volume/bsa, ES, 1-p A2C 48.5 ml/m^2 ---------  Mitral valve Value Reference Mitral E-wave peak velocity 105 cm/s --------- Mitral A-wave peak velocity 105 cm/s --------- Mitral deceleration time (H) 292 ms 150 - 230 Mitral peak gradient, D 4 mm Hg --------- Mitral E/A ratio, peak 1 ---------  Right atrium Value Reference RA ID, S-I, ES 46.2 mm --------- RA ID, M-L, ES, A4C 36.6 mm 30 - 46  Right ventricle Value Reference RV ID, minor axis, ED, A4C base 41.5 mm --------- TAPSE 25.7 mm --------- RV s&', lateral, S 12 cm/s ---------  Legend: (L) and (H) mark values outside specified reference range.  ------------------------------------------------------------------- Prepared and Electronically Authenticated by  Jenkins Rouge, M.D. 2019-09-04T12:33:50   RIGHT/LEFT HEART CATH AND CORONARY ANGIOGRAPHY  Conclusion     The left  ventricular systolic function is normal.  LV end diastolic pressure is normal.  The left ventricular ejection fraction is 55-65% by visual estimate.  There is severe (4+) mitral regurgitation.  There is no aortic valve stenosis.  No significant coronary artery disease.  Continue with plans for mitral valve repair.      Indications   Severe mitral regurgitation [I34.0 (ICD-10-CM)]  Procedural Details/Technique   Technical Details The risks, benefits, and details of the procedure were explained to the  patient. The patient verbalized understanding and wanted to proceed. Informed written consent was obtained.  PROCEDURE TECHNIQUE: After Xylocaine anesthesia, a 5 French sheath was placed in the right antecubital area in exchange for a peripheral IV. A 5 French balloontipped Swan-Ganz catheter was advanced to the pulmonary artery under fluoroscopic guidance. Hemodynamic pressures were obtained. Oxygen saturations were obtained. After Xylocaine anesthesia, a 14F sheath was placed in the right radial artery with a single anterior needle wall stick. Left coronary angiography was done using a Judkins L3.5 guide catheter. Right coronary angiography was done using a Judkins R4 guide catheter. Left heart cath was done using a pigtail catheter.     Contrast: 70 cc     Estimated blood loss <50 mL.  During this procedure the patient was administered the following to achieve and maintain moderate conscious sedation: Versed 2 mg, Fentanyl 25 mcg, while the patient's heart rate, blood pressure, and oxygen saturation were continuously monitored. The period of conscious sedation was 28 minutes, of which I was present face-to-face 100% of this time.  Complications   Complications documented before study signed (03/24/2018 9:22 AM EDT)    No complications were associated with this study.  Documented by Jettie Booze, MD - 03/24/2018 9:20 AM EDT    Coronary Findings   Diagnostic   Dominance: Right  Left Main  Vessel was injected. Vessel is normal in caliber. Vessel is angiographically normal.  Left Anterior Descending  Vessel was injected. Vessel is normal in caliber. Vessel is angiographically normal.  Left Circumflex  Vessel was injected. Vessel is normal in caliber. Vessel is angiographically normal.  Right Coronary Artery  Vessel was injected. Vessel is normal in caliber. Vessel is angiographically normal.  Intervention   No interventions have been documented.  Right Heart   Right Heart Pressures Ao sat 97%, PA sat 75%, PA pressure 23/10, mean PA 14 mm Hg; PCWP 11/9, mean PCWP 7 mm Hg; CO 6.2 L/min; CI 2.9  Wall Motion        Resting               Left Heart   Left Ventricle The left ventricular systolic function is normal. LV end diastolic pressure is normal. The left ventricular ejection fraction is 55-65% by visual estimate. No regional wall motion abnormalities. There is severe (4+) mitral regurgitation.  Aortic Valve There is no aortic valve stenosis.  Coronary Diagrams   Diagnostic Diagram       Implants       No implant documentation for this case.  MERGE Images   Show images for CARDIAC CATHETERIZATION   Link to Procedure Log   Procedure Log    Hemo Data    Most Recent Value  Fick Cardiac Output 6.28 L/min  Fick Cardiac Output Index 2.91 (L/min)/BSA  RA A Wave 11 mmHg  RA V Wave 8 mmHg  RA Mean 6 mmHg  RV Systolic Pressure 28 mmHg  RV Diastolic Pressure 3 mmHg  RV EDP 9 mmHg  PA Systolic Pressure 23 mmHg  PA Diastolic Pressure 6 mmHg  PA Mean 13 mmHg  PW A Wave 11 mmHg  PW V Wave 9 mmHg  PW Mean 7 mmHg  AO Systolic Pressure 301 mmHg  AO Diastolic Pressure 66 mmHg  AO Mean 87 mmHg  LV Systolic Pressure 601 mmHg  LV Diastolic Pressure 0 mmHg  LV EDP 12 mmHg  AOp Systolic Pressure 093 mmHg  AOp Diastolic Pressure 58 mmHg  AOp Mean Pressure 79  mmHg  LVp Systolic Pressure 606 mmHg  LVp Diastolic  Pressure 2 mmHg  LVp EDP Pressure 11 mmHg  QP/QS 1  TPVR Index 4.47 HRUI  TSVR Index 29.96 HRUI  PVR SVR Ratio 0.07  TPVR/TSVR Ratio 0.15   CT ANGIOGRAPHY CHEST, ABDOMEN AND PELVIS  TECHNIQUE: Multidetector CT imaging through the chest, abdomen and pelvis was performed using the standard protocol during bolus administration of intravenous contrast. Multiplanar reconstructed images and MIPs were obtained and reviewed to evaluate the vascular anatomy.  CONTRAST:  73mL ISOVUE-300 IOPAMIDOL (ISOVUE-300) INJECTION 61%  COMPARISON:  CT scan of August 06, 2012.  FINDINGS: CTA CHEST FINDINGS  Cardiovascular: Satisfactory opacification of the pulmonary arteries to the segmental level. No evidence of pulmonary embolism. Normal heart size. No pericardial effusion.  Mediastinum/Nodes: No enlarged mediastinal, hilar, or axillary lymph nodes. Thyroid gland, trachea, and esophagus demonstrate no significant findings.  Lungs/Pleura: Lungs are clear. No pleural effusion or pneumothorax.  Musculoskeletal: No chest wall abnormality. No acute or significant osseous findings.  Review of the MIP images confirms the above findings.  CTA ABDOMEN AND PELVIS FINDINGS  VASCULAR  Aorta: Normal caliber aorta without aneurysm, dissection, vasculitis or significant stenosis.  Celiac: Patent without evidence of aneurysm, dissection, vasculitis or significant stenosis.  SMA: Patent without evidence of aneurysm, dissection, vasculitis or significant stenosis.  Renals: Both renal arteries are patent without evidence of aneurysm, dissection, vasculitis, fibromuscular dysplasia or significant stenosis.  IMA: Patent without evidence of aneurysm, dissection, vasculitis or significant stenosis.  Inflow: Patent without evidence of aneurysm, dissection, vasculitis or significant stenosis.  Veins: No obvious venous abnormality within the limitations of this arterial phase  study.  Review of the MIP images confirms the above findings.  NON-VASCULAR  Hepatobiliary: Stable left hepatic cyst is noted. No gallstones are noted. No biliary dilatation is noted.  Pancreas: Unremarkable. No pancreatic ductal dilatation or surrounding inflammatory changes.  Spleen: Normal in size without focal abnormality.  Adrenals/Urinary Tract: Adrenal glands appear normal. No hydronephrosis or renal obstruction is noted. No renal or ureteral calculi are noted. Urinary bladder is unremarkable. Interval development of 2 cm exophytic abnormality arising from midpole of right kidney concerning for cyst or possible mass. Renal ultrasound is recommended for further evaluation. Left kidney appears normal.  Stomach/Bowel: The stomach appears normal. There is no evidence of bowel obstruction or inflammation. The appendix is not visualized.  Lymphatic: No significant adenopathy is noted.  Reproductive: Stable mild prostatic enlargement.  Other: No abdominal wall hernia or abnormality. No abdominopelvic ascites.  Musculoskeletal: No acute or significant osseous findings.  Review of the MIP images confirms the above findings.  IMPRESSION: No evidence of thoracic or abdominal aortic dissection or aneurysm.  No significant mesenteric or renal artery stenosis is noted.  2 cm exophytic abnormality is seen arising from midpole of right kidney which may simply represent cyst, but renal ultrasound is recommended to rule out possible mass or neoplasm.  Stable mild prostatic enlargement.   Electronically Signed   By: Marijo Conception, M.D.   On: 04/20/2018 11:40   Impression:  Patient has mitral valve prolapse with stage D severe symptomatic primary mitral regurgitation.  He describes stable symptoms of exertional shortness of breath that occur only with more strenuous physical exertion, consistent with chronic diastolic congestive heart failure [place]  [employer] functional class I-II.  The patient also experiences frequent recurrent episodes of palpitations and has been diagnosed with recurrent paroxysmal atrial fibrillation.  I have personally reviewed the patient's  most recent transthoracic and transesophageal echocardiograms as well as his diagnostic cardiac catheterization.  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  There is severe mitral regurgitation.  Left ventricular size and function appear normal.  No other complicating features are seen.  The patient also recently was treated with bacterial endocarditis, but I see no evidence of active vegetations nor sign of leaflet perforation.  I suspect the patient should be able to expect very high likelihood of successful and durable mitral valve repair with low operative risk.  The patient would best be treated with elective mitral valve repair.  He might benefit from concomitant Maze procedure.  He may be a good candidate for minimally invasive approach for surgery.   Plan:  The patient was counseled at length regarding the indications, risks and potential benefits of mitral valve repair.  The rationale for elective surgery has been explained, including a comparison between surgery and continued medical therapy with close follow-up.  The likelihood of successful and durable valve repair has been discussed with particular reference to the findings of their recent echocardiogram.  Based upon these findings and previous experience, I have quoted him a greater than 95 percent likelihood of successful valve repair.  Alternative surgical approaches have been discussed including a comparison between conventional sternotomy and minimally-invasive techniques.  The relative risks and benefits of each have been reviewed as they pertain to  the patient's specific circumstances, and all of their questions have been addressed.   The relative risks and benefits of performing a maze procedure at the time of their surgery was discussed at length, including the expected likelihood of long term freedom from recurrent symptomatic atrial fibrillation and/or atrial flutter.  The patient understands and accepts all potential risks of surgery including but not limited to risk of death, stroke or other neurologic complication, myocardial infarction, congestive heart failure, respiratory failure, renal failure, bleeding requiring transfusion and/or reexploration, arrhythmia, infection or other wound complications, pneumonia, pleural and/or pericardial effusion, pulmonary embolus, aortic dissection or other major vascular complication, or delayed complications related to valve repair or replacement including but not limited to structural valve deterioration and failure, thrombosis, embolization, endocarditis, or paravalvular leak.  All of their questions have been answered.  We tentatively plan to proceed with minimally invasive mitral valve repair and Maze procedure on April 27, 2018.  Prior to surgery the patient will undergo CT angiography to evaluate the feasibility of peripheral cannulation for surgery.  The patient has been instructed to stop taking Eliquis 7 days prior to surgery.       Valentina Gu. Roxy Manns, MD 03/31/2018 3:07 PM

## 2018-04-27 ENCOUNTER — Inpatient Hospital Stay (HOSPITAL_COMMUNITY): Payer: Medicare Other

## 2018-04-27 ENCOUNTER — Encounter (HOSPITAL_COMMUNITY): Payer: Self-pay

## 2018-04-27 ENCOUNTER — Inpatient Hospital Stay (HOSPITAL_COMMUNITY): Payer: Medicare Other | Admitting: Certified Registered Nurse Anesthetist

## 2018-04-27 ENCOUNTER — Other Ambulatory Visit (HOSPITAL_COMMUNITY): Payer: Medicare Other

## 2018-04-27 ENCOUNTER — Inpatient Hospital Stay (HOSPITAL_COMMUNITY): Payer: Medicare Other | Admitting: Physician Assistant

## 2018-04-27 ENCOUNTER — Encounter (HOSPITAL_COMMUNITY)
Admission: RE | Disposition: A | Discharge status: Home / Self Care | Payer: Self-pay | Source: Home / Self Care | Attending: Thoracic Surgery (Cardiothoracic Vascular Surgery)

## 2018-04-27 ENCOUNTER — Other Ambulatory Visit: Payer: Self-pay

## 2018-04-27 ENCOUNTER — Inpatient Hospital Stay (HOSPITAL_COMMUNITY)
Admission: RE | Admit: 2018-04-27 | Discharge: 2018-05-02 | DRG: 220 | Disposition: A | Discharge status: Home / Self Care | Payer: Medicare Other | Attending: Thoracic Surgery (Cardiothoracic Vascular Surgery) | Admitting: Thoracic Surgery (Cardiothoracic Vascular Surgery)

## 2018-04-27 DIAGNOSIS — K7689 Other specified diseases of liver: Secondary | ICD-10-CM | POA: Diagnosis present

## 2018-04-27 DIAGNOSIS — I472 Ventricular tachycardia: Secondary | ICD-10-CM | POA: Diagnosis not present

## 2018-04-27 DIAGNOSIS — D6959 Other secondary thrombocytopenia: Secondary | ICD-10-CM | POA: Diagnosis not present

## 2018-04-27 DIAGNOSIS — Z882 Allergy status to sulfonamides status: Secondary | ICD-10-CM | POA: Diagnosis not present

## 2018-04-27 DIAGNOSIS — Z7901 Long term (current) use of anticoagulants: Secondary | ICD-10-CM | POA: Diagnosis not present

## 2018-04-27 DIAGNOSIS — I440 Atrioventricular block, first degree: Secondary | ICD-10-CM | POA: Diagnosis not present

## 2018-04-27 DIAGNOSIS — I059 Rheumatic mitral valve disease, unspecified: Secondary | ICD-10-CM | POA: Diagnosis not present

## 2018-04-27 DIAGNOSIS — Z79899 Other long term (current) drug therapy: Secondary | ICD-10-CM | POA: Diagnosis not present

## 2018-04-27 DIAGNOSIS — I480 Paroxysmal atrial fibrillation: Secondary | ICD-10-CM | POA: Diagnosis present

## 2018-04-27 DIAGNOSIS — I080 Rheumatic disorders of both mitral and aortic valves: Secondary | ICD-10-CM | POA: Diagnosis not present

## 2018-04-27 DIAGNOSIS — I340 Nonrheumatic mitral (valve) insufficiency: Secondary | ICD-10-CM | POA: Diagnosis present

## 2018-04-27 DIAGNOSIS — N400 Enlarged prostate without lower urinary tract symptoms: Secondary | ICD-10-CM | POA: Diagnosis present

## 2018-04-27 DIAGNOSIS — Z800 Family history of malignant neoplasm of digestive organs: Secondary | ICD-10-CM

## 2018-04-27 DIAGNOSIS — D62 Acute posthemorrhagic anemia: Secondary | ICD-10-CM | POA: Diagnosis not present

## 2018-04-27 DIAGNOSIS — I10 Essential (primary) hypertension: Secondary | ICD-10-CM | POA: Diagnosis present

## 2018-04-27 DIAGNOSIS — I5032 Chronic diastolic (congestive) heart failure: Secondary | ICD-10-CM | POA: Diagnosis present

## 2018-04-27 DIAGNOSIS — R001 Bradycardia, unspecified: Secondary | ICD-10-CM | POA: Diagnosis not present

## 2018-04-27 DIAGNOSIS — I110 Hypertensive heart disease with heart failure: Secondary | ICD-10-CM | POA: Diagnosis present

## 2018-04-27 DIAGNOSIS — Z803 Family history of malignant neoplasm of breast: Secondary | ICD-10-CM

## 2018-04-27 DIAGNOSIS — Z8679 Personal history of other diseases of the circulatory system: Secondary | ICD-10-CM

## 2018-04-27 DIAGNOSIS — I77810 Thoracic aortic ectasia: Secondary | ICD-10-CM | POA: Diagnosis present

## 2018-04-27 DIAGNOSIS — I341 Nonrheumatic mitral (valve) prolapse: Secondary | ICD-10-CM | POA: Diagnosis present

## 2018-04-27 DIAGNOSIS — E876 Hypokalemia: Secondary | ICD-10-CM | POA: Diagnosis not present

## 2018-04-27 DIAGNOSIS — Q430 Meckel's diverticulum (displaced) (hypertrophic): Secondary | ICD-10-CM

## 2018-04-27 DIAGNOSIS — I371 Nonrheumatic pulmonary valve insufficiency: Secondary | ICD-10-CM | POA: Diagnosis not present

## 2018-04-27 DIAGNOSIS — J9811 Atelectasis: Secondary | ICD-10-CM

## 2018-04-27 DIAGNOSIS — I482 Chronic atrial fibrillation, unspecified: Secondary | ICD-10-CM | POA: Diagnosis not present

## 2018-04-27 DIAGNOSIS — Z8249 Family history of ischemic heart disease and other diseases of the circulatory system: Secondary | ICD-10-CM

## 2018-04-27 DIAGNOSIS — Z888 Allergy status to other drugs, medicaments and biological substances status: Secondary | ICD-10-CM

## 2018-04-27 DIAGNOSIS — N451 Epididymitis: Secondary | ICD-10-CM | POA: Diagnosis present

## 2018-04-27 DIAGNOSIS — Z9889 Other specified postprocedural states: Secondary | ICD-10-CM

## 2018-04-27 DIAGNOSIS — Z4682 Encounter for fitting and adjustment of non-vascular catheter: Secondary | ICD-10-CM | POA: Diagnosis not present

## 2018-04-27 DIAGNOSIS — J90 Pleural effusion, not elsewhere classified: Secondary | ICD-10-CM | POA: Diagnosis not present

## 2018-04-27 DIAGNOSIS — I519 Heart disease, unspecified: Secondary | ICD-10-CM | POA: Diagnosis present

## 2018-04-27 DIAGNOSIS — I5189 Other ill-defined heart diseases: Secondary | ICD-10-CM | POA: Diagnosis present

## 2018-04-27 HISTORY — DX: Other specified postprocedural states: Z98.890

## 2018-04-27 HISTORY — PX: TEE WITHOUT CARDIOVERSION: SHX5443

## 2018-04-27 HISTORY — PX: CLIPPING OF ATRIAL APPENDAGE: SHX5773

## 2018-04-27 HISTORY — DX: Personal history of other diseases of the circulatory system: Z86.79

## 2018-04-27 HISTORY — PX: MITRAL VALVE REPAIR: SHX2039

## 2018-04-27 HISTORY — PX: MINIMALLY INVASIVE MAZE PROCEDURE: SHX6244

## 2018-04-27 LAB — POCT I-STAT 3, ART BLOOD GAS (G3+)
ACID-BASE DEFICIT: 3 mmol/L — AB (ref 0.0–2.0)
Acid-base deficit: 2 mmol/L (ref 0.0–2.0)
Acid-base deficit: 5 mmol/L — ABNORMAL HIGH (ref 0.0–2.0)
Acid-base deficit: 6 mmol/L — ABNORMAL HIGH (ref 0.0–2.0)
Acid-base deficit: 7 mmol/L — ABNORMAL HIGH (ref 0.0–2.0)
BICARBONATE: 22.6 mmol/L (ref 20.0–28.0)
BICARBONATE: 21.4 mmol/L (ref 20.0–28.0)
Bicarbonate: 24.7 mmol/L (ref 20.0–28.0)
Bicarbonate: 19.3 mmol/L — ABNORMAL LOW (ref 20.0–28.0)
Bicarbonate: 18.2 mmol/L — ABNORMAL LOW (ref 20.0–28.0)
O2 SAT: 95 %
O2 Saturation: 100 %
O2 Saturation: 98 %
O2 Saturation: 96 %
O2 Saturation: 94 %
PCO2 ART: 50.5 mmHg — AB (ref 32.0–48.0)
PCO2 ART: 36.7 mmHg (ref 32.0–48.0)
PCO2 ART: 33.8 mmHg (ref 32.0–48.0)
PH ART: 7.297 — AB (ref 7.350–7.450)
PH ART: 7.326 — AB (ref 7.350–7.450)
PH ART: 7.339 — AB (ref 7.350–7.450)
PO2 ART: 80 mmHg — AB (ref 83.0–108.0)
TCO2: 26 mmol/L (ref 22–32)
TCO2: 24 mmol/L (ref 22–32)
TCO2: 23 mmol/L (ref 22–32)
TCO2: 20 mmol/L — AB (ref 22–32)
TCO2: 19 mmol/L — ABNORMAL LOW (ref 22–32)
pCO2 arterial: 43.5 mmHg (ref 32.0–48.0)
pCO2 arterial: 39.6 mmHg (ref 32.0–48.0)
pH, Arterial: 7.323 — ABNORMAL LOW (ref 7.350–7.450)
pH, Arterial: 7.335 — ABNORMAL LOW (ref 7.350–7.450)
pO2, Arterial: 447 mmHg — ABNORMAL HIGH (ref 83.0–108.0)
pO2, Arterial: 103 mmHg (ref 83.0–108.0)
pO2, Arterial: 87 mmHg (ref 83.0–108.0)
pO2, Arterial: 76 mmHg — ABNORMAL LOW (ref 83.0–108.0)

## 2018-04-27 LAB — POCT I-STAT, CHEM 8
BUN: 21 mg/dL (ref 8–23)
BUN: 18 mg/dL (ref 8–23)
BUN: 21 mg/dL (ref 8–23)
BUN: 20 mg/dL (ref 8–23)
BUN: 20 mg/dL (ref 8–23)
BUN: 23 mg/dL (ref 8–23)
BUN: 20 mg/dL (ref 8–23)
BUN: 19 mg/dL (ref 8–23)
CALCIUM ION: 1.21 mmol/L (ref 1.15–1.40)
CALCIUM ION: 1.07 mmol/L — AB (ref 1.15–1.40)
CALCIUM ION: 1.12 mmol/L — AB (ref 1.15–1.40)
CHLORIDE: 107 mmol/L (ref 98–111)
CHLORIDE: 105 mmol/L (ref 98–111)
CHLORIDE: 107 mmol/L (ref 98–111)
CHLORIDE: 106 mmol/L (ref 98–111)
CHLORIDE: 107 mmol/L (ref 98–111)
CHLORIDE: 107 mmol/L (ref 98–111)
CHLORIDE: 114 mmol/L — AB (ref 98–111)
CREATININE: 0.7 mg/dL (ref 0.61–1.24)
CREATININE: 0.8 mg/dL (ref 0.61–1.24)
CREATININE: 0.9 mg/dL (ref 0.61–1.24)
Calcium, Ion: 0.99 mmol/L — ABNORMAL LOW (ref 1.15–1.40)
Calcium, Ion: 1.2 mmol/L (ref 1.15–1.40)
Calcium, Ion: 1.09 mmol/L — ABNORMAL LOW (ref 1.15–1.40)
Calcium, Ion: 1.06 mmol/L — ABNORMAL LOW (ref 1.15–1.40)
Calcium, Ion: 1.22 mmol/L (ref 1.15–1.40)
Chloride: 107 mmol/L (ref 98–111)
Creatinine, Ser: 0.9 mg/dL (ref 0.61–1.24)
Creatinine, Ser: 0.8 mg/dL (ref 0.61–1.24)
Creatinine, Ser: 0.8 mg/dL (ref 0.61–1.24)
Creatinine, Ser: 0.8 mg/dL (ref 0.61–1.24)
Creatinine, Ser: 0.8 mg/dL (ref 0.61–1.24)
GLUCOSE: 95 mg/dL (ref 70–99)
GLUCOSE: 107 mg/dL — AB (ref 70–99)
GLUCOSE: 104 mg/dL — AB (ref 70–99)
GLUCOSE: 160 mg/dL — AB (ref 70–99)
Glucose, Bld: 120 mg/dL — ABNORMAL HIGH (ref 70–99)
Glucose, Bld: 139 mg/dL — ABNORMAL HIGH (ref 70–99)
Glucose, Bld: 130 mg/dL — ABNORMAL HIGH (ref 70–99)
Glucose, Bld: 119 mg/dL — ABNORMAL HIGH (ref 70–99)
HCT: 39 % (ref 39.0–52.0)
HCT: 36 % — ABNORMAL LOW (ref 39.0–52.0)
HCT: 38 % — ABNORMAL LOW (ref 39.0–52.0)
HCT: 31 % — ABNORMAL LOW (ref 39.0–52.0)
HCT: 35 % — ABNORMAL LOW (ref 39.0–52.0)
HEMATOCRIT: 32 % — AB (ref 39.0–52.0)
HEMATOCRIT: 32 % — AB (ref 39.0–52.0)
HEMATOCRIT: 35 % — AB (ref 39.0–52.0)
HEMOGLOBIN: 12.9 g/dL — AB (ref 13.0–17.0)
HEMOGLOBIN: 10.9 g/dL — AB (ref 13.0–17.0)
HEMOGLOBIN: 11.9 g/dL — AB (ref 13.0–17.0)
HEMOGLOBIN: 11.9 g/dL — AB (ref 13.0–17.0)
Hemoglobin: 13.3 g/dL (ref 13.0–17.0)
Hemoglobin: 12.2 g/dL — ABNORMAL LOW (ref 13.0–17.0)
Hemoglobin: 10.5 g/dL — ABNORMAL LOW (ref 13.0–17.0)
Hemoglobin: 10.9 g/dL — ABNORMAL LOW (ref 13.0–17.0)
POTASSIUM: 3.6 mmol/L (ref 3.5–5.1)
POTASSIUM: 4 mmol/L (ref 3.5–5.1)
POTASSIUM: 5 mmol/L (ref 3.5–5.1)
POTASSIUM: 3.7 mmol/L (ref 3.5–5.1)
Potassium: 4.2 mmol/L (ref 3.5–5.1)
Potassium: 4.7 mmol/L (ref 3.5–5.1)
Potassium: 4.7 mmol/L (ref 3.5–5.1)
Potassium: 4.1 mmol/L (ref 3.5–5.1)
SODIUM: 138 mmol/L (ref 135–145)
SODIUM: 138 mmol/L (ref 135–145)
SODIUM: 136 mmol/L (ref 135–145)
Sodium: 142 mmol/L (ref 135–145)
Sodium: 141 mmol/L (ref 135–145)
Sodium: 142 mmol/L (ref 135–145)
Sodium: 138 mmol/L (ref 135–145)
Sodium: 140 mmol/L (ref 135–145)
TCO2: 24 mmol/L (ref 22–32)
TCO2: 24 mmol/L (ref 22–32)
TCO2: 25 mmol/L (ref 22–32)
TCO2: 26 mmol/L (ref 22–32)
TCO2: 25 mmol/L (ref 22–32)
TCO2: 25 mmol/L (ref 22–32)
TCO2: 22 mmol/L (ref 22–32)
TCO2: 21 mmol/L — ABNORMAL LOW (ref 22–32)

## 2018-04-27 LAB — POCT I-STAT 4, (NA,K, GLUC, HGB,HCT)
Glucose, Bld: 111 mg/dL — ABNORMAL HIGH (ref 70–99)
HEMATOCRIT: 39 % (ref 39.0–52.0)
HEMOGLOBIN: 13.3 g/dL (ref 13.0–17.0)
POTASSIUM: 4.1 mmol/L (ref 3.5–5.1)
SODIUM: 141 mmol/L (ref 135–145)

## 2018-04-27 LAB — GLUCOSE, CAPILLARY
GLUCOSE-CAPILLARY: 105 mg/dL — AB (ref 70–99)
GLUCOSE-CAPILLARY: 119 mg/dL — AB (ref 70–99)
Glucose-Capillary: 113 mg/dL — ABNORMAL HIGH (ref 70–99)
Glucose-Capillary: 97 mg/dL (ref 70–99)
Glucose-Capillary: 102 mg/dL — ABNORMAL HIGH (ref 70–99)
Glucose-Capillary: 123 mg/dL — ABNORMAL HIGH (ref 70–99)

## 2018-04-27 LAB — HEMOGLOBIN AND HEMATOCRIT, BLOOD
HEMATOCRIT: 34.4 % — AB (ref 39.0–52.0)
Hemoglobin: 11.2 g/dL — ABNORMAL LOW (ref 13.0–17.0)

## 2018-04-27 LAB — CBC
HCT: 41 % (ref 39.0–52.0)
HEMATOCRIT: 38.5 % — AB (ref 39.0–52.0)
HEMOGLOBIN: 12.7 g/dL — AB (ref 13.0–17.0)
Hemoglobin: 13.6 g/dL (ref 13.0–17.0)
MCH: 30.1 pg (ref 26.0–34.0)
MCH: 29.9 pg (ref 26.0–34.0)
MCHC: 33.2 g/dL (ref 30.0–36.0)
MCHC: 33 g/dL (ref 30.0–36.0)
MCV: 90.7 fL (ref 80.0–100.0)
MCV: 90.6 fL (ref 80.0–100.0)
NRBC: 0 % (ref 0.0–0.2)
PLATELETS: 146 10*3/uL — AB (ref 150–400)
Platelets: 124 10*3/uL — ABNORMAL LOW (ref 150–400)
RBC: 4.52 MIL/uL (ref 4.22–5.81)
RBC: 4.25 MIL/uL (ref 4.22–5.81)
RDW: 13 % (ref 11.5–15.5)
RDW: 13 % (ref 11.5–15.5)
WBC: 13.7 10*3/uL — ABNORMAL HIGH (ref 4.0–10.5)
WBC: 10.3 10*3/uL (ref 4.0–10.5)
nRBC: 0 % (ref 0.0–0.2)

## 2018-04-27 LAB — CREATININE, SERUM
Creatinine, Ser: 0.91 mg/dL (ref 0.61–1.24)
GFR calc Af Amer: >60 mL/min (ref >60)
GFR calc non Af Amer: >60 mL/min (ref >60)

## 2018-04-27 LAB — APTT: APTT: 37 s — AB (ref 24–36)

## 2018-04-27 LAB — MAGNESIUM: MAGNESIUM: 2.9 mg/dL — AB (ref 1.7–2.4)

## 2018-04-27 LAB — PLATELET COUNT: Platelets: 144 10*3/uL — ABNORMAL LOW (ref 150–400)

## 2018-04-27 LAB — PROTIME-INR
INR: 1.3
PROTHROMBIN TIME: 16 s — AB (ref 11.4–15.2)

## 2018-04-27 SURGERY — REPAIR, MITRAL VALVE, MINIMALLY INVASIVE
Anesthesia: General | Site: Chest | Laterality: Right

## 2018-04-27 MED ORDER — POTASSIUM CHLORIDE 10 MEQ/50ML IV SOLN: 10.0000 meq | INTRAVENOUS | Status: AC

## 2018-04-27 MED ORDER — 0.9 % SODIUM CHLORIDE (POUR BTL) OPTIME
TOPICAL | Status: DC | PRN
  Administered 2018-04-27: 4000 mL

## 2018-04-27 MED ORDER — METOPROLOL TARTRATE 12.5 MG HALF TABLET: 12.5000 mg | ORAL_TABLET | Freq: Once | ORAL | Status: DC

## 2018-04-27 MED ORDER — LACTATED RINGERS IV SOLN
INTRAVENOUS | Status: DC | PRN
  Administered 2018-04-27: 08:00:00 via INTRAVENOUS

## 2018-04-27 MED ORDER — CHLORHEXIDINE GLUCONATE 4 % EX LIQD: 30.0000 mL | CUTANEOUS | Status: DC

## 2018-04-27 MED ORDER — MIDAZOLAM HCL 5 MG/5ML IJ SOLN
INTRAMUSCULAR | Status: DC | PRN
  Administered 2018-04-27 (×2): 1 mg via INTRAVENOUS
  Administered 2018-04-27: 3 mg via INTRAVENOUS
  Administered 2018-04-27 (×2): 1 mg via INTRAVENOUS

## 2018-04-27 MED ORDER — INSULIN REGULAR BOLUS VIA INFUSION
0.0000 [IU] | Freq: Three times a day (TID) | INTRAVENOUS | Status: DC
  Filled 2018-04-27: qty 10

## 2018-04-27 MED ORDER — FENTANYL CITRATE (PF) 250 MCG/5ML IJ SOLN
INTRAMUSCULAR | Status: AC
  Filled 2018-04-27: qty 10

## 2018-04-27 MED ORDER — OXYCODONE HCL 5 MG PO TABS
5.0000 mg | ORAL_TABLET | ORAL | Status: DC | PRN
  Administered 2018-04-28 (×4): 10 mg via ORAL
  Administered 2018-04-29 – 2018-04-30 (×4): 5 mg via ORAL
  Administered 2018-04-30: 10 mg via ORAL
  Administered 2018-05-01 – 2018-05-02 (×6): 5 mg via ORAL
  Filled 2018-04-27 (×2): qty 2
  Filled 2018-04-27 (×4): qty 1
  Filled 2018-04-27: qty 2
  Filled 2018-04-27 (×2): qty 1
  Filled 2018-04-27: qty 2
  Filled 2018-04-27 (×3): qty 1
  Filled 2018-04-27: qty 2
  Filled 2018-04-27: qty 1

## 2018-04-27 MED ORDER — GLYCOPYRROLATE PF 0.2 MG/ML IJ SOSY
PREFILLED_SYRINGE | INTRAMUSCULAR | Status: AC
  Filled 2018-04-27: qty 1

## 2018-04-27 MED ORDER — SODIUM CHLORIDE 0.9 % IV SOLN: 250.0000 mL | INTRAVENOUS | Status: DC

## 2018-04-27 MED ORDER — PROTAMINE SULFATE 10 MG/ML IV SOLN
INTRAVENOUS | Status: DC | PRN
  Administered 2018-04-27: 20 mg via INTRAVENOUS
  Administered 2018-04-27: 320 mg via INTRAVENOUS

## 2018-04-27 MED ORDER — TRAMADOL HCL 50 MG PO TABS
50.0000 mg | ORAL_TABLET | ORAL | Status: DC | PRN
  Administered 2018-04-28: 100 mg via ORAL
  Filled 2018-04-27: qty 2

## 2018-04-27 MED ORDER — FAMOTIDINE IN NACL 20-0.9 MG/50ML-% IV SOLN
20.0000 mg | Freq: Two times a day (BID) | INTRAVENOUS | Status: DC
  Administered 2018-04-27: 20 mg via INTRAVENOUS

## 2018-04-27 MED ORDER — PHENYLEPHRINE 40 MCG/ML (10ML) SYRINGE FOR IV PUSH (FOR BLOOD PRESSURE SUPPORT)
PREFILLED_SYRINGE | INTRAVENOUS | Status: AC
  Filled 2018-04-27: qty 10

## 2018-04-27 MED ORDER — HEPARIN SODIUM (PORCINE) 1000 UNIT/ML IJ SOLN
INTRAMUSCULAR | Status: DC | PRN
  Administered 2018-04-27: 34000 [IU] via INTRAVENOUS

## 2018-04-27 MED ORDER — DEXAMETHASONE SODIUM PHOSPHATE 10 MG/ML IJ SOLN
INTRAMUSCULAR | Status: DC | PRN
  Administered 2018-04-27: 10 mg via INTRAVENOUS

## 2018-04-27 MED ORDER — MIDAZOLAM HCL 2 MG/2ML IJ SOLN: 2.0000 mg | INTRAMUSCULAR | Status: DC | PRN

## 2018-04-27 MED ORDER — GLYCOPYRROLATE 0.2 MG/ML IJ SOLN
INTRAMUSCULAR | Status: DC | PRN
  Administered 2018-04-27: 0.2 mg via INTRAVENOUS

## 2018-04-27 MED ORDER — SODIUM CHLORIDE 0.45 % IV SOLN
INTRAVENOUS | Status: DC | PRN
  Administered 2018-04-27: 15:00:00 via INTRAVENOUS

## 2018-04-27 MED ORDER — METOPROLOL TARTRATE 25 MG/10 ML ORAL SUSPENSION: 12.5000 mg | Freq: Two times a day (BID) | ORAL | Status: DC

## 2018-04-27 MED ORDER — SODIUM CHLORIDE 0.9 % IV SOLN
INTRAVENOUS | Status: DC | PRN
  Administered 2018-04-27: 750 mg via INTRAVENOUS

## 2018-04-27 MED ORDER — BUPIVACAINE HCL (PF) 0.5 % IJ SOLN
INTRAMUSCULAR | Status: AC
  Filled 2018-04-27: qty 10

## 2018-04-27 MED ORDER — SODIUM CHLORIDE 0.9 % IV SOLN
INTRAVENOUS | Status: DC | PRN
  Administered 2018-04-27: 1 [IU]/h via INTRAVENOUS

## 2018-04-27 MED ORDER — DEXMEDETOMIDINE HCL IN NACL 200 MCG/50ML IV SOLN: 0.0000 ug/kg/h | INTRAVENOUS | Status: DC

## 2018-04-27 MED ORDER — BUPIVACAINE HCL (PF) 0.5 % IJ SOLN
INTRAMUSCULAR | Status: DC | PRN
  Administered 2018-04-27: 5 mL

## 2018-04-27 MED ORDER — CHLORHEXIDINE GLUCONATE 0.12 % MT SOLN
15.0000 mL | Freq: Once | OROMUCOSAL | Status: AC
  Administered 2018-04-27: 15 mL via OROMUCOSAL
  Filled 2018-04-27: qty 15

## 2018-04-27 MED ORDER — MIDAZOLAM HCL 10 MG/2ML IJ SOLN
INTRAMUSCULAR | Status: AC
  Filled 2018-04-27: qty 2

## 2018-04-27 MED ORDER — MUPIROCIN 2 % EX OINT
1.0000 "application " | TOPICAL_OINTMENT | Freq: Two times a day (BID) | CUTANEOUS | Status: AC
  Administered 2018-04-27 – 2018-04-29 (×5): 1 via NASAL
  Filled 2018-04-27: qty 22

## 2018-04-27 MED ORDER — ONDANSETRON HCL 4 MG/2ML IJ SOLN
4.0000 mg | Freq: Four times a day (QID) | INTRAMUSCULAR | Status: DC | PRN
  Administered 2018-04-29: 4 mg via INTRAVENOUS
  Filled 2018-04-27: qty 2

## 2018-04-27 MED ORDER — DOCUSATE SODIUM 100 MG PO CAPS
200.0000 mg | ORAL_CAPSULE | Freq: Every day | ORAL | Status: DC
  Administered 2018-04-28 – 2018-05-02 (×5): 200 mg via ORAL
  Filled 2018-04-27 (×6): qty 2

## 2018-04-27 MED ORDER — VANCOMYCIN HCL IN DEXTROSE 1-5 GM/200ML-% IV SOLN
1000.0000 mg | Freq: Once | INTRAVENOUS | Status: AC
  Administered 2018-04-27: 1000 mg via INTRAVENOUS
  Filled 2018-04-27: qty 200

## 2018-04-27 MED ORDER — MAGNESIUM SULFATE 4 GM/100ML IV SOLN
4.0000 g | Freq: Once | INTRAVENOUS | Status: AC
  Administered 2018-04-27: 4 g via INTRAVENOUS
  Filled 2018-04-27: qty 100

## 2018-04-27 MED ORDER — PROPOFOL 10 MG/ML IV BOLUS
INTRAVENOUS | Status: AC
  Filled 2018-04-27: qty 20

## 2018-04-27 MED ORDER — ONDANSETRON HCL 4 MG/2ML IJ SOLN
INTRAMUSCULAR | Status: AC
  Filled 2018-04-27: qty 2

## 2018-04-27 MED ORDER — SODIUM CHLORIDE 0.9 % IV SOLN
INTRAVENOUS | Status: DC
  Administered 2018-04-27: 15:00:00 via INTRAVENOUS

## 2018-04-27 MED ORDER — EPHEDRINE 5 MG/ML INJ
INTRAVENOUS | Status: AC
  Filled 2018-04-27: qty 10

## 2018-04-27 MED ORDER — FENTANYL CITRATE (PF) 250 MCG/5ML IJ SOLN
INTRAMUSCULAR | Status: DC | PRN
  Administered 2018-04-27: 100 ug via INTRAVENOUS
  Administered 2018-04-27 (×2): 50 ug via INTRAVENOUS
  Administered 2018-04-27: 500 ug via INTRAVENOUS
  Administered 2018-04-27: 50 ug via INTRAVENOUS

## 2018-04-27 MED ORDER — BISACODYL 10 MG RE SUPP: 10.0000 mg | Freq: Every day | RECTAL | Status: DC

## 2018-04-27 MED ORDER — SODIUM CHLORIDE 0.9 % IV SOLN: INTRAVENOUS | Status: DC

## 2018-04-27 MED ORDER — LACTATED RINGERS IV SOLN
INTRAVENOUS | Status: DC
  Administered 2018-04-28: 06:00:00 via INTRAVENOUS

## 2018-04-27 MED ORDER — CHLORHEXIDINE GLUCONATE 0.12 % MT SOLN
15.0000 mL | OROMUCOSAL | Status: AC
  Administered 2018-04-27: 15 mL via OROMUCOSAL

## 2018-04-27 MED ORDER — METOPROLOL TARTRATE 5 MG/5ML IV SOLN: 2.5000 mg | INTRAVENOUS | Status: DC | PRN

## 2018-04-27 MED ORDER — ROCURONIUM BROMIDE 50 MG/5ML IV SOSY
PREFILLED_SYRINGE | INTRAVENOUS | Status: AC
  Filled 2018-04-27: qty 10

## 2018-04-27 MED ORDER — ASPIRIN 81 MG PO CHEW: 324.0000 mg | CHEWABLE_TABLET | Freq: Every day | ORAL | Status: DC

## 2018-04-27 MED ORDER — BISACODYL 5 MG PO TBEC
10.0000 mg | DELAYED_RELEASE_TABLET | Freq: Every day | ORAL | Status: DC
  Administered 2018-04-28 – 2018-05-02 (×5): 10 mg via ORAL
  Filled 2018-04-27 (×5): qty 2

## 2018-04-27 MED ORDER — BUPIVACAINE 0.5 % ON-Q PUMP SINGLE CATH 400 ML
INJECTION | Status: AC | PRN
  Administered 2018-04-27: 400 mL

## 2018-04-27 MED ORDER — ROCURONIUM BROMIDE 10 MG/ML (PF) SYRINGE
PREFILLED_SYRINGE | INTRAVENOUS | Status: DC | PRN
  Administered 2018-04-27: 100 mg via INTRAVENOUS
  Administered 2018-04-27: 50 mg via INTRAVENOUS

## 2018-04-27 MED ORDER — VECURONIUM BROMIDE 10 MG IV SOLR
INTRAVENOUS | Status: DC | PRN
  Administered 2018-04-27: 2 mg via INTRAVENOUS
  Administered 2018-04-27: 1 mg via INTRAVENOUS
  Administered 2018-04-27: 5 mg via INTRAVENOUS
  Administered 2018-04-27: 2 mg via INTRAVENOUS

## 2018-04-27 MED ORDER — ALBUMIN HUMAN 5 % IV SOLN
250.0000 mL | INTRAVENOUS | Status: AC | PRN
  Administered 2018-04-27 (×2): 12.5 g via INTRAVENOUS

## 2018-04-27 MED ORDER — POTASSIUM CHLORIDE 10 MEQ/50ML IV SOLN
10.0000 meq | INTRAVENOUS | Status: AC | PRN
  Administered 2018-04-27 (×3): 10 meq via INTRAVENOUS

## 2018-04-27 MED ORDER — SODIUM CHLORIDE 0.9 % IV SOLN
1.5000 g | Freq: Two times a day (BID) | INTRAVENOUS | Status: AC
  Administered 2018-04-27 – 2018-04-29 (×4): 1.5 g via INTRAVENOUS
  Filled 2018-04-27 (×4): qty 1.5

## 2018-04-27 MED ORDER — PROPOFOL 10 MG/ML IV BOLUS
INTRAVENOUS | Status: DC | PRN
  Administered 2018-04-27: 20 mg via INTRAVENOUS
  Administered 2018-04-27: 30 mg via INTRAVENOUS
  Administered 2018-04-27: 10 mg via INTRAVENOUS

## 2018-04-27 MED ORDER — MORPHINE SULFATE (PF) 2 MG/ML IV SOLN: 1.0000 mg | INTRAVENOUS | Status: DC | PRN

## 2018-04-27 MED ORDER — DEXMEDETOMIDINE HCL IN NACL 400 MCG/100ML IV SOLN
0.0000 ug/kg/h | INTRAVENOUS | Status: DC
  Administered 2018-04-27: 0.5 ug/kg/h via INTRAVENOUS

## 2018-04-27 MED ORDER — MORPHINE SULFATE (PF) 2 MG/ML IV SOLN
1.0000 mg | INTRAVENOUS | Status: DC | PRN
  Administered 2018-04-27 – 2018-04-29 (×4): 2 mg via INTRAVENOUS
  Filled 2018-04-27 (×4): qty 1

## 2018-04-27 MED ORDER — FENTANYL CITRATE (PF) 250 MCG/5ML IJ SOLN
INTRAMUSCULAR | Status: AC
  Filled 2018-04-27: qty 5

## 2018-04-27 MED ORDER — LIDOCAINE 2% (20 MG/ML) 5 ML SYRINGE
INTRAMUSCULAR | Status: AC
  Filled 2018-04-27: qty 5

## 2018-04-27 MED ORDER — LACTATED RINGERS IV SOLN: 500.0000 mL | Freq: Once | INTRAVENOUS | Status: DC | PRN

## 2018-04-27 MED ORDER — INSULIN ASPART 100 UNIT/ML ~~LOC~~ SOLN
0.0000 [IU] | SUBCUTANEOUS | Status: DC
  Administered 2018-04-27: 2 [IU] via SUBCUTANEOUS

## 2018-04-27 MED ORDER — ACETAMINOPHEN 160 MG/5ML PO SOLN: 1000.0000 mg | Freq: Four times a day (QID) | ORAL | Status: DC

## 2018-04-27 MED ORDER — SODIUM CHLORIDE 0.9% FLUSH: 3.0000 mL | INTRAVENOUS | Status: DC | PRN

## 2018-04-27 MED ORDER — PHENYLEPHRINE HCL-NACL 20-0.9 MG/250ML-% IV SOLN
0.0000 ug/min | INTRAVENOUS | Status: DC
  Filled 2018-04-27: qty 250

## 2018-04-27 MED ORDER — NITROGLYCERIN IN D5W 200-5 MCG/ML-% IV SOLN
0.0000 ug/min | INTRAVENOUS | Status: DC
  Administered 2018-04-27: 70 ug/min via INTRAVENOUS

## 2018-04-27 MED ORDER — ONDANSETRON HCL 4 MG/2ML IJ SOLN
INTRAMUSCULAR | Status: DC | PRN
  Administered 2018-04-27: 4 mg via INTRAVENOUS

## 2018-04-27 MED ORDER — PANTOPRAZOLE SODIUM 40 MG PO TBEC
40.0000 mg | DELAYED_RELEASE_TABLET | Freq: Every day | ORAL | Status: DC
  Administered 2018-04-29 – 2018-05-02 (×4): 40 mg via ORAL
  Filled 2018-04-27 (×4): qty 1

## 2018-04-27 MED ORDER — ACETAMINOPHEN 650 MG RE SUPP
650.0000 mg | Freq: Once | RECTAL | Status: AC
  Administered 2018-04-27: 650 mg via RECTAL

## 2018-04-27 MED ORDER — ACETAMINOPHEN 160 MG/5ML PO SOLN: 650.0000 mg | Freq: Once | ORAL | Status: AC

## 2018-04-27 MED ORDER — BUPIVACAINE 0.5 % ON-Q PUMP SINGLE CATH 400 ML
400.0000 mL | INJECTION | Status: DC
  Filled 2018-04-27: qty 400

## 2018-04-27 MED ORDER — ASPIRIN EC 325 MG PO TBEC: 325.0000 mg | DELAYED_RELEASE_TABLET | Freq: Every day | ORAL | Status: DC

## 2018-04-27 MED ORDER — METOPROLOL TARTRATE 12.5 MG HALF TABLET: 12.5000 mg | ORAL_TABLET | Freq: Two times a day (BID) | ORAL | Status: DC

## 2018-04-27 MED ORDER — SODIUM CHLORIDE 0.9 % IV SOLN
INTRAVENOUS | Status: DC | PRN
  Administered 2018-04-27: 20 ug/min via INTRAVENOUS

## 2018-04-27 MED ORDER — INSULIN REGULAR(HUMAN) IN NACL 100-0.9 UT/100ML-% IV SOLN
INTRAVENOUS | Status: DC
  Administered 2018-04-27: 1 [IU]/h via INTRAVENOUS

## 2018-04-27 MED ORDER — LACTATED RINGERS IV SOLN: INTRAVENOUS | Status: DC

## 2018-04-27 MED ORDER — SODIUM CHLORIDE 0.9% FLUSH
3.0000 mL | Freq: Two times a day (BID) | INTRAVENOUS | Status: DC
  Administered 2018-04-28 (×2): 3 mL via INTRAVENOUS

## 2018-04-27 MED ORDER — ACETAMINOPHEN 500 MG PO TABS
1000.0000 mg | ORAL_TABLET | Freq: Four times a day (QID) | ORAL | Status: DC
  Administered 2018-04-28 – 2018-05-02 (×17): 1000 mg via ORAL
  Filled 2018-04-27 (×16): qty 2

## 2018-04-27 SURGICAL SUPPLY — 116 items
ADAPTER CARDIO PERF ANTE/RETRO (ADAPTER) ×4 IMPLANT
ADH SKN CLS APL DERMABOND .7 (GAUZE/BANDAGES/DRESSINGS) ×6
ADPR PRFSN 84XANTGRD RTRGD (ADAPTER) ×3
ARTICLIP LAA PROCLIP II 45 (Clip) ×4 IMPLANT
BAG DECANTER FOR FLEXI CONT (MISCELLANEOUS) ×8 IMPLANT
BLADE STERNUM SYSTEM 6 (BLADE) ×1 IMPLANT
BLADE SURG 11 STRL SS (BLADE) ×4 IMPLANT
CANISTER SUCT 3000ML PPV (MISCELLANEOUS) ×8 IMPLANT
CANNULA FEM VENOUS REMOTE 22FR (CANNULA) IMPLANT
CANNULA FEMORAL ART 14 SM (MISCELLANEOUS) ×4 IMPLANT
CANNULA GUNDRY RCSP 15FR (MISCELLANEOUS) ×4 IMPLANT
CANNULA OPTISITE PERFUSION 16F (CANNULA) IMPLANT
CANNULA OPTISITE PERFUSION 18F (CANNULA) IMPLANT
CANNULA SUMP PERICARDIAL (CANNULA) ×8 IMPLANT
CARDIOBLATE CARDIAC ABLATION (MISCELLANEOUS)
CATH KIT ON Q 5IN SLV (PAIN MANAGEMENT) IMPLANT
CELLS DAT CNTRL 66122 CELL SVR (MISCELLANEOUS) ×3 IMPLANT
CLAMP OLL ABLATION (MISCELLANEOUS) ×1 IMPLANT
CONN ST 1/4X3/8  BEN (MISCELLANEOUS) ×2
CONN ST 1/4X3/8 BEN (MISCELLANEOUS) ×6 IMPLANT
CONNECTOR 1/2X3/8X1/2 3 WAY (MISCELLANEOUS) ×1
CONNECTOR 1/2X3/8X1/2 3WAY (MISCELLANEOUS) ×3 IMPLANT
CONT SPEC 4OZ CLIKSEAL STRL BL (MISCELLANEOUS) ×4 IMPLANT
CONT SPECI 4OZ STER CLIK (MISCELLANEOUS) ×1 IMPLANT
COVER BACK TABLE 24X17X13 BIG (DRAPES) ×4 IMPLANT
COVER WAND RF STERILE (DRAPES) ×4 IMPLANT
CRADLE DONUT ADULT HEAD (MISCELLANEOUS) ×4 IMPLANT
DERMABOND ADVANCED (GAUZE/BANDAGES/DRESSINGS) ×2
DERMABOND ADVANCED .7 DNX12 (GAUZE/BANDAGES/DRESSINGS) ×6 IMPLANT
DEVICE ATRICLIP LAA PRCLPII 45 (Clip) IMPLANT
DEVICE CARDIOBLATE CARDIAC ABL (MISCELLANEOUS) IMPLANT
DEVICE PMI PUNCTURE CLOSURE (MISCELLANEOUS) ×4 IMPLANT
DEVICE SUT CK QUICK LOAD INDV (Prosthesis & Implant Heart) ×1 IMPLANT
DEVICE SUT CK QUICK LOAD MINI (Prosthesis & Implant Heart) ×2 IMPLANT
DEVICE TROCAR PUNCTURE CLOSURE (ENDOMECHANICALS) ×4 IMPLANT
DRAIN CHANNEL 28F RND 3/8 FF (WOUND CARE) ×8 IMPLANT
DRAPE BILATERAL SPLIT (DRAPES) ×4 IMPLANT
DRAPE C-ARM 42X72 X-RAY (DRAPES) ×4 IMPLANT
DRAPE CV SPLIT W-CLR ANES SCRN (DRAPES) ×4 IMPLANT
DRAPE INCISE IOBAN 66X45 STRL (DRAPES) ×12 IMPLANT
DRAPE SLUSH/WARMER DISC (DRAPES) ×4 IMPLANT
DRSG COVADERM 4X8 (GAUZE/BANDAGES/DRESSINGS) ×4 IMPLANT
ELECT BLADE 6.5 EXT (BLADE) ×4 IMPLANT
ELECT REM PT RETURN 9FT ADLT (ELECTROSURGICAL) ×8
ELECTRODE REM PT RTRN 9FT ADLT (ELECTROSURGICAL) ×6 IMPLANT
FELT TEFLON 1X6 (MISCELLANEOUS) ×8 IMPLANT
FEMORAL VENOUS CANN RAP (CANNULA) IMPLANT
GAUZE SPONGE 4X4 12PLY STRL (GAUZE/BANDAGES/DRESSINGS) ×4 IMPLANT
GAUZE SPONGE 4X4 12PLY STRL LF (GAUZE/BANDAGES/DRESSINGS) ×4 IMPLANT
GLOVE ORTHO TXT STRL SZ7.5 (GLOVE) ×12 IMPLANT
GOWN STRL REUS W/ TWL LRG LVL3 (GOWN DISPOSABLE) ×12 IMPLANT
GOWN STRL REUS W/TWL LRG LVL3 (GOWN DISPOSABLE) ×16
KIT BASIN OR (CUSTOM PROCEDURE TRAY) ×4 IMPLANT
KIT DILATOR VASC 18G NDL (KITS) ×4 IMPLANT
KIT DRAINAGE VACCUM ASSIST (KITS) ×1 IMPLANT
KIT SUCTION CATH 14FR (SUCTIONS) ×4 IMPLANT
KIT SUT CK MINI COMBO 4X17 (Prosthesis & Implant Heart) ×1 IMPLANT
KIT TURNOVER KIT B (KITS) ×4 IMPLANT
LEAD PACING MYOCARDI (MISCELLANEOUS) ×5 IMPLANT
LINE VENT (MISCELLANEOUS) ×1 IMPLANT
NDL AORTIC ROOT 14G 7F (CATHETERS) ×3 IMPLANT
NEEDLE AORTIC ROOT 14G 7F (CATHETERS) ×4 IMPLANT
NS IRRIG 1000ML POUR BTL (IV SOLUTION) ×20 IMPLANT
PACK OPEN HEART (CUSTOM PROCEDURE TRAY) ×4 IMPLANT
PAD ARMBOARD 7.5X6 YLW CONV (MISCELLANEOUS) ×8 IMPLANT
PAD ELECT DEFIB RADIOL ZOLL (MISCELLANEOUS) ×4 IMPLANT
PROBE CRYO2-ABLATION MALLABLE (MISCELLANEOUS) ×1 IMPLANT
RETRACTOR WND ALEXIS 18 MED (MISCELLANEOUS) ×3 IMPLANT
RING MITRAL MEMO 4D 34 (Prosthesis & Implant Heart) ×1 IMPLANT
RTRCTR WOUND ALEXIS 18CM MED (MISCELLANEOUS) ×4
SET CANNULATION TOURNIQUET (MISCELLANEOUS) ×4 IMPLANT
SET CARDIOPLEGIA MPS 5001102 (MISCELLANEOUS) ×1 IMPLANT
SET IRRIG TUBING LAPAROSCOPIC (IRRIGATION / IRRIGATOR) ×4 IMPLANT
SOLUTION ANTI FOG 6CC (MISCELLANEOUS) ×4 IMPLANT
SUT BONE WAX W31G (SUTURE) ×4 IMPLANT
SUT E-PACK MINIMALLY INVASIVE (SUTURE) ×4 IMPLANT
SUT ETHIBOND (SUTURE) ×2 IMPLANT
SUT ETHIBOND 2 0 SH (SUTURE) ×1 IMPLANT
SUT ETHIBOND 2 0 V4 (SUTURE) IMPLANT
SUT ETHIBOND 2 0V4 GREEN (SUTURE) IMPLANT
SUT ETHIBOND 2-0 RB-1 WHT (SUTURE) ×2 IMPLANT
SUT ETHIBOND 4 0 TF (SUTURE) IMPLANT
SUT ETHIBOND 5 0 C 1 30 (SUTURE) IMPLANT
SUT ETHIBOND NAB MH 2-0 36IN (SUTURE) IMPLANT
SUT ETHIBOND X763 2 0 SH 1 (SUTURE) ×4 IMPLANT
SUT GORETEX 6.0 TH-9 30 IN (SUTURE) IMPLANT
SUT GORETEX CV 4 TH 22 36 (SUTURE) ×4 IMPLANT
SUT GORETEX CV-5THC-13 36IN (SUTURE) ×7 IMPLANT
SUT GORETEX CV4 TH-18 (SUTURE) ×8 IMPLANT
SUT GORETEX TH-18 36 INCH (SUTURE) IMPLANT
SUT PROLENE 3 0 SH DA (SUTURE) ×1 IMPLANT
SUT PROLENE 3 0 SH1 36 (SUTURE) ×18 IMPLANT
SUT PROLENE 4 0 RB 1 (SUTURE) ×4
SUT PROLENE 4-0 RB1 .5 CRCL 36 (SUTURE) IMPLANT
SUT PROLENE 5 0 C 1 36 (SUTURE) ×2 IMPLANT
SUT PTFE CHORD X 20MM (SUTURE) ×2 IMPLANT
SUT SILK 2 0 SH CR/8 (SUTURE) IMPLANT
SUT SILK 3 0 SH CR/8 (SUTURE) IMPLANT
SUT TEM PAC WIRE 2 0 SH (SUTURE) ×2 IMPLANT
SUT VIC AB 2-0 CTX 36 (SUTURE) IMPLANT
SUT VIC AB 3-0 SH 8-18 (SUTURE) ×1 IMPLANT
SUT VICRYL 2 TP 1 (SUTURE) IMPLANT
SYR 10ML LL (SYRINGE) ×4 IMPLANT
SYSTEM SAHARA CHEST DRAIN ATS (WOUND CARE) ×8 IMPLANT
TAPE CLOTH SURG 4X10 WHT LF (GAUZE/BANDAGES/DRESSINGS) ×1 IMPLANT
TAPE PAPER 3X10 WHT MICROPORE (GAUZE/BANDAGES/DRESSINGS) ×1 IMPLANT
TOWEL GREEN STERILE (TOWEL DISPOSABLE) ×4 IMPLANT
TOWEL GREEN STERILE FF (TOWEL DISPOSABLE) ×4 IMPLANT
TRAY FOLEY SLVR 16FR TEMP STAT (SET/KITS/TRAYS/PACK) ×4 IMPLANT
TROCAR XCEL BLADELESS 5X75MML (TROCAR) ×4 IMPLANT
TROCAR XCEL NON-BLD 11X100MML (ENDOMECHANICALS) ×8 IMPLANT
TUBE SUCT INTRACARD DLP 20F (MISCELLANEOUS) ×4 IMPLANT
TUNNELER SHEATH ON-Q 11GX8 DSP (PAIN MANAGEMENT) IMPLANT
UNDERPAD 30X30 (UNDERPADS AND DIAPERS) ×4 IMPLANT
WATER STERILE IRR 1000ML POUR (IV SOLUTION) ×8 IMPLANT
WIRE .035 3MM-J 145CM (WIRE) ×4 IMPLANT

## 2018-04-27 NOTE — Progress Notes (Signed)
Patient ID: Chase Murch., male   DOB: 06/07/1946, 72 y.o.   MRN: [id number]  TCTS Evening Rounds:   Hemodynamically stable  CI = 1.98 Atrial paced at 66 Has started to wake up on vent.    Urine output good  CT output low  CBC    Component Value Date/Time   WBC 13.7 (H) 04/27/2018 1451   RBC 4.52 04/27/2018 1451   HGB 13.3 04/27/2018 1454   HGB 15.3 03/15/2018 1651   HCT 39.0 04/27/2018 1454   HCT 45.3 03/15/2018 1651   PLT 146 (L) 04/27/2018 1451   PLT 241 03/15/2018 1651   MCV 90.7 04/27/2018 1451   MCV 90 03/15/2018 1651   MCH 30.1 04/27/2018 1451   MCHC 33.2 04/27/2018 1451   RDW 13.0 04/27/2018 1451   RDW 13.4 03/15/2018 1651   LYMPHSABS 1.9 03/15/2018 1651   MONOABS 0.6 01/01/2018 2028   EOSABS 0.3 03/15/2018 1651   BASOSABS 0.1 03/15/2018 1651     BMET    Component Value Date/Time   NA 141 04/27/2018 1454   NA 140 03/15/2018 1651   K 4.1 04/27/2018 1454   CL 107 04/27/2018 1336   CO2 19 (L) 04/23/2018 1515   GLUCOSE 111 (H) 04/27/2018 1454   BUN 20 04/27/2018 1336   BUN 17 03/15/2018 1651   CREATININE 0.90 04/27/2018 1336   CALCIUM 8.9 04/23/2018 1515   GFRNONAA >60 04/23/2018 1515   GFRAA >60 04/23/2018 1515     A/P:  Stable postop course. Continue current plans

## 2018-04-27 NOTE — Anesthesia Procedure Notes (Signed)
Central Venous Catheter Insertion Performed by: Roderic Palau, MD, anesthesiologist Start/End10/29/2019 6:55 AM, 04/27/2018 7:20 AM Patient location: Pre-op. Preanesthetic checklist: patient identified, IV checked, site marked, risks and benefits discussed, surgical consent, monitors and equipment checked, pre-op evaluation, timeout performed and anesthesia consent Position: Trendelenburg Lidocaine 1% used for infiltration and patient sedated Hand hygiene performed , maximum sterile barriers used  and Seldinger technique used Catheter size: 9 Fr Total catheter length 10. Central line was placed.MAC introducer Swan type:thermodilution Procedure performed using ultrasound guided technique. Ultrasound Notes:anatomy identified, needle tip was noted to be adjacent to the nerve/plexus identified, no ultrasound evidence of intravascular and/or intraneural injection and image(s) printed for medical record Attempts: 2 (Attempted L side. Unable to pass wire.) Following insertion, line sutured, dressing applied and Biopatch. Post procedure assessment: blood return through all ports, free fluid flow and no air  Patient tolerated the procedure well with no immediate complications.

## 2018-04-27 NOTE — Op Note (Signed)
CARDIOTHORACIC SURGERY OPERATIVE NOTE  Date of Procedure:   04/27/2018  Preoperative Diagnosis:    Severe Mitral Regurgitation  Recurrent Paroxysmal Atrial Fibrillation  Postoperative Diagnosis: Same  Procedure:    Minimally-Invasive Mitral Valve Repair  Complex valvuloplasty including triangular resection of flail segment (P2) of posterior leaflet  Artificial Gore-tex neochord placement x8  Sorin Memo 4D Ring Annuloplasty (size 47mm, catalog #[id number], serial [id number])   Minimally-Invasive Maze Procedure  Complete bilateral atrial lesion set using cryothermy and bipolar radiofrequency ablation  Clipping of Left Atrial Appendage (Atricure Pro245 left atrial clip, size 45 mm)  Surgeon: Valentina Gu. Roxy Manns, MD  Assistant: Ellwood Handler, PA-C  Anesthesia: Roderic Palau, MD  Operative Findings:  Fibroelastic deficiency type myxomatous degenerative disease  Multiple elongated and ruptured primary chordae tendinae with flail segment (P2) of posterior leaflet  Type II dysfunction with severe mitral regurgitation  Normal left ventricular systolic function  No residual mitral regurgitation after successful valve repair                       BRIEF CLINICAL NOTE AND INDICATIONS FOR SURGERY  Patient is a 72 year old male with history of mitral valve prolapse with mitral regurgitation, recurrent paroxysmal atrial fibrillation and hypertension who has been referred for surgical consultation to discuss treatment options for management of severe symptomatic primary mitral regurgitation with recurrent paroxysmal atrial fibrillation.  Patient states that he was first noted to have a heart murmur on physical exam in 1995. He was followed for many years by his primary care physician, and more recently he has been followed by Dr. Radford Pax. Previous echocardiograms have documented the presence of mitral valve prolapse with what was felt to be moderate mitral  regurgitation. Several years ago he began to experience episodes of tachycardia palpitations and shortness of breath. He was diagnosed with paroxysmal atrial fibrillation. He has been anticoagulated using Eliquis and for the most part he remains in sinus rhythm. The patient was hospitalized in July 2019 with Streptococcus viridans bacteremia. Transesophageal echocardiogram performed at that time demonstrated the presence of mitral valve prolapse with severe mitral regurgitation. No definite vegetation was identified. The patient was treated for presumed bacterial endocarditis with a 6-week course of intravenous antibiotics. Symptoms of myalgias, fevers, night sweats, and headaches all resolved. He was evaluated by his dentist asan outpatient and noted to have a root abscess requiring tooth extraction and a second problematic tooth requiring root canal. These were performed uneventfully. The patient was subsequently referred to Dr. Burt Knack for consultation. He subsequently underwent left and right heart catheterization on March 24, 2018. Catheterization confirmed the presence of severe mitral regurgitation. The patient was noted to have normal coronary arteries with no significant coronary artery disease. Right heart pressures and left ventricular end-diastolic pressure was normal. The patient was referred for surgical consultation.  The patient has been seen in consultation and counseled at length regarding the indications, risks and potential benefits of surgery.  All questions have been answered, and the patient provides full informed consent for the operation as described.    DETAILS OF THE OPERATIVE PROCEDURE  Preparation:  The patient is brought to the operating room on the above mentioned date and central monitoring was established by the anesthesia team including placement of Swan-Ganz catheter through the right internal jugular vein.  A radial arterial line is placed. The  patient is placed in the supine position on the operating table.  Intravenous antibiotics are administered. General endotracheal anesthesia is induced uneventfully. The  patient is initially intubated using a dual lumen endotracheal tube.  A Foley catheter is placed.  Baseline transesophageal echocardiogram was performed.  Findings were notable for myxomatous degenerative disease of the mitral valve with an obvious flail segment involving the middle scallop of the posterior leaflet.  There was severe mitral regurgitation.  There was normal left ventricular size and systolic function.  There was trace central aortic insufficiency.  There was normal right ventricular size and function.  There was trace tricuspid regurgitation.  No other abnormalities were noted.  A soft roll is placed behind the patient's left scapula and the neck gently extended and turned to the left.   The patient's right neck, chest, abdomen, both groins, and both lower extremities are prepared and draped in a sterile manner. A time out procedure is performed.   Surgical Approach:  A right miniature anterolateral thoracotomy incision is performed. The incision is placed just lateral to and superior to the right nipple. The pectoralis major muscle is retracted medially and completely preserved. The right pleural space is entered through the 3rd intercostal space. A soft tissue retractor is placed.  Two 11 mm ports are placed through separate stab incisions inferiorly. The right pleural space is insufflated continuously with carbon dioxide gas through the posterior port during the remainder of the operation.  A pledgeted sutures placed through the dome of the right hemidiaphragm and retracted inferiorly to facilitate exposure.  A longitudinal incision is made in the pericardium 3 cm anterior to the phrenic nerve and silk traction sutures are placed on either side of the incision for exposure.   Extracorporeal Cardiopulmonary Bypass:  A  small incision is made in the right inguinal crease and the anterior surface of the right common femoral artery and right common femoral vein are identified.  The patient is placed in Trendelenburg position. The right internal jugular vein is cannulated with Seldinger technique and a guidewire advanced into the right atrium. The patient is heparinized systemically. The right internal jugular vein is cannulated with a 14 [place] pediatric femoral venous cannula. Pursestring sutures are placed on the anterior surface of the right common femoral vein and right common femoral artery. The right common femoral vein is cannulated with the Seldinger technique and a guidewire is advanced under transesophageal echocardiogram guidance through the right atrium. The femoral vein is cannulated with a long 22 French femoral venous cannula. The right common femoral artery is cannulated with Seldinger technique and a flexible guidewire is advanced until it can be appreciated intraluminally in the descending thoracic aorta on transesophageal echocardiogram. The femoral artery is cannulated with an 18 French femoral arterial cannula.  Adequate heparinization is verified.      The entire pre-bypass portion of the operation was notable for stable hemodynamics.  Cardiopulmonary bypass was begun.  Vacuum assist venous drainage is utilized. The incision in the pericardium is extended in both directions. Venous drainage and exposure are notably excellent.    Clipping of Left Atrial Appendage:  The left atrial appendage is obliterated using an Atricure left atrial appendage clip (Atriclip Pro245, size 45 mm).  The clip is applied under thoracoscopic visualization posterior to the aorta and pulmonary artery through the oblique sinus.  The clip was applied prior to application of the aortic crossclamp, with transesophageal echocardiographic confirmation that the clip satisfactorily obliterates the appendage.   Myocardial  Protection:  A retrograde cardioplegia cannula is placed through the right atrium into the coronary sinus using transesophageal echocardiogram guidance.  An antegrade cardioplegia  cannula is placed in the ascending aorta.  The patient is cooled to 28C systemic temperature.  The aortic cross clamp is applied and cardioplegia is delivered initially in an antegrade fashion through the aortic root using modified [place] cold blood cardioplegia ([place] blood cardioplegia protocol).   The initial cardioplegic arrest is rapid with early diastolic arrest.  Repeat doses of cardioplegia are administered at 90 minutes and every 30 minutes thereafter through the coronary sinus catheter in order to maintain completely flat electrocardiogram.  Myocardial protection was felt to be excellent.   Maze Procedure (left atrial lesion set):  Following placement of the aortic crossclamp and the administration of the initial arresting dose of cardioplegia, a left atriotomy incision was performed through the interatrial groove and extended partially across the back wall of the left atrium after opening the oblique sinus inferiorly.  The mitral valve and floor of the left atrium are exposed using a self-retaining retractor.    The Atricure CryoICE nitrous oxide cryothermy system is utilized for all cryothermy ablation lesions.  The left atrial lesion set of the Cox cryomaze procedure is performed using 3 minute duration for all cryothermy lesions.  Initially a lesion is placed along the endocardial surface of the left atrium from the caudad apex of the atriotomy incision across the posterior wall of the left atrium onto the posterior mitral annulus.  A mirror image lesion along the epicardial surface is then performed with the probe posterior to the left atrium, crossing over the coronary sinus and extending up the epicardial surface of the posterolateral right atrium.  Two lesions are then performed to create a box isolating  all of the pulmonary veins from the remainder of the left atrium.  The first lesion is placed from the cephalad apex of the atriotomy incision across the dome of the left atrium to just anterior to the left sided pulmonary veins.  The second lesion completes the box from the caudad apex of the atriotomy incision across the back wall of the left atrium to connect with the previous lesion just anterior to the left sided pulmonary veins.     Mitral Valve Repair:  The mitral valve was inspected and notable for fibroelastic deficiency type myxomatous degenerative disease.  There was an obvious flail segment involving the middle portion of the middle scallop (P2) of the posterior leaflet.  There were elongated chordae tendinae from the anterior papillary muscle and ruptured chordae tendinae from the posterior papillary muscle.  Interrupted 2-0 Ethibond horizontal mattress sutures are placed circumferentially around the entire mitral valve annulus. The sutures will ultimately be utilized for ring annuloplasty, and at this juncture there are utilized to suspend the valve symmetrically.  The prolapsing segment is repaired using a simple triangular resection of the flail portion of P2.  A total of approximately 20% of the surface area of P2 was removed.  The intervening vertical defect and P2 was closed using simple everting interrupted CV 5 Gore-Tex suture.  Artificial neochord placement was performed using Chord-X multi-strand CV-4 Goretex pre-measured loops.  The appropriate cord length was measured from corresponding normal length primary cords from the P1 segment of the posterior leaflet. The papillary muscle suture of the Chord-X multi-strand suture was placed through the head of the posterior papillary muscle in a horizontal mattress fashion and tied over Teflon felt pledgets. Two of the three pre-measured loops were then reimplanted into the free margin of the P2 segment of the posterior leaflet on the  posterior side of  midline.  The papillary muscle suture of a second Chord-X multi-strand suture was placed through the head of the anterior papillary muscle in a horizontal mattress fashion and tied over Teflon felt pledgets. Two of the three pre-measured loops were then reimplanted into the free margin of the P2 segment of the posterior leaflet on the anterior side of midline.  The third loop was discarded.  The valve is tested with saline and appears reasonably competent even prior to ring annuloplasty.  The valve is sized to accept a 34 mm annuloplasty ring based upon the distance between the left and right commissures, the height and the surface area of the anterior leaflet.  A Sorin Memo 4D annuloplasty ring (size 76mm, catalog # [id number], serial # [id number]) is implanted uneventfully.  All ring sutures were secured using a Cor-knot device.  The valve is again tested with saline and appears to be perfectly competent with a broad symmetrical line of coaptation of the anterior and posterior leaflet. There is no residual leak. Rewarming is begun.  The atriotomy was closed using a 2-layer closure of running 3-0 Prolene suture after placing a sump drain across the mitral valve to serve as a left ventricular vent.  One final dose of warm retrograde "reanimation dose" cardioplegia was administered retrograde through the coronary sinus catheter while all air was evacuated through the aortic root.  The aortic cross clamp was removed after a total cross clamp time of 131 minutes.   Maze Procedure (right atrial lesion set):  The retrograde cardioplegia cannula was removed and the small hole in the right atrium extended a short distance.  The AtriCure Synergy bipolar radiofrequency ablation clamp is utilized to create a series of linear lesions in the right atrium, each with one limb of the clamp along the endocardial surface and the other along the epicardial surface. The first lesion is placed from the posterior  apex of the atriotomy incision and along the lateral wall of the right atrium to reach the lateral aspect of the superior vena cava, connecting with the cryothermy lesion placed previously from the coronary sinus to the epicardial surface of the posterolateral right atrium. A second lesion is placed in the opposite direction from the posterior apex of the atriotomy incision along the lateral wall to reach the lateral aspect of the inferior vena cava. A third lesion is placed from the midportion of the atriotomy incision extending at a right angle to reach the tip of the right atrial appendage. A fourth lesion is placed from the anterior apex of the atriotomy incision in an anterior and inferior direction to reach the acute margin of the heart. Finally, the cryotherapy probe is utilized to complete the right atrial lesion set by placing the probe along the endocardial surface of the right atrium from the anterior apex of the atriotomy incision to reach the tricuspid annulus at the 2:00 position. The atriotomy incision is closed with a 2 layer closure of running 4-0 Prolene suture.   Procedure Completion:  Epicardial pacing wires are fixed to the inferior wall of the right ventricule and to the right atrial appendage. The patient is rewarmed to 37C temperature. The left ventricular vent is removed.  The patient is ventilated and flow volumes turndown while the mitral valve repair is inspected using transesophageal echocardiogram. The valve repair appears intact with no residual leak. The antegrade cardioplegia cannula is removed. The patient is weaned and disconnected from cardiopulmonary bypass.  The patient's rhythm at separation from bypass was sinus.  The patient was weaned from bypass without any inotropic support. Total cardiopulmonary bypass time for the operation was 199 minutes.  Followup transesophageal echocardiogram performed after separation from bypass revealed a well-seated annuloplasty ring in  the mitral position with a normal functioning mitral valve. There was no residual leak.  Left ventricular function was unchanged from preoperatively.  Mean transvalvular gradient across the mitral valve was estimated 3 mmHg.  The femoral arterial and venous cannulae were removed uneventfully. There was a palpable pulse in the distal right common femoral artery after removal of the cannula. Protamine was administered to reverse the anticoagulation. The right internal jugular cannula was removed and manual pressure held on the neck for 15 minutes.  Single lung ventilation was begun. The atriotomy closure was inspected for hemostasis. The pericardial sac was drained using a 28 French Bard drain placed through the anterior port incision.  The pericardium was closed using a patch of core matrix bovine submucosal tissue patch. The right pleural space is irrigated with saline solution and inspected for hemostasis. The right pleural space was drained using a 28 French Bard drain placed through the posterior port incision. The miniature thoracotomy incision was closed in multiple layers in routine fashion. The right groin incision was inspected for hemostasis and closed in multiple layers in routine fashion.  The post-bypass portion of the operation was notable for stable rhythm and hemodynamics.  No blood products were administered during the operation.   Disposition:  The patient tolerated the procedure well.  The patient was reintubated using a single lumen endotracheal tube and subsequently transported to the surgical intensive care unit in stable condition. There were no intraoperative complications. All sponge instrument and needle counts are verified correct at completion of the operation.    Valentina Gu. Roxy Manns MD 04/27/2018 2:09 PM

## 2018-04-27 NOTE — Progress Notes (Signed)
Pt took 6.25mg  of Coreg this morning. HR 48. Per Dr. Ola Spurr, pt does not need ordered dose of metoprolol. Pt also stated that he took a dose of mupirocin this morning prior to coming to surgery to treat staph. This would be dose 7 for patient. Will need to continue treatment as inpatient.   Jacqlyn Larsen, RN

## 2018-04-27 NOTE — Anesthesia Procedure Notes (Signed)
Procedure Name: Intubation Performed by: Milford Cage, CRNA Pre-anesthesia Checklist: Patient identified, Emergency Drugs available, Suction available and Patient being monitored Patient Re-evaluated:Patient Re-evaluated prior to induction Oxygen Delivery Method: Circle System Utilized Preoxygenation: Pre-oxygenation with 100% oxygen Induction Type: IV induction Ventilation: Mask ventilation without difficulty Laryngoscope Size: Mac and 3 Grade View: Grade II Tube type: Oral Endobronchial tube: Left, EBT position confirmed by auscultation, EBT position confirmed by fiberoptic bronchoscope and Double lumen EBT and 41 Fr Number of attempts: 1 Airway Equipment and Method: Stylet Placement Confirmation: ETT inserted through vocal cords under direct vision,  positive ETCO2 and breath sounds checked- equal and bilateral Secured at: 28 cm Tube secured with: Tape Dental Injury: Teeth and Oropharynx as per pre-operative assessment

## 2018-04-27 NOTE — Anesthesia Procedure Notes (Addendum)
Procedure Name: Intubation Date/Time: 04/27/2018 2:22 PM Performed by: Milford Cage, CRNA Pre-anesthesia Checklist: Patient identified, Emergency Drugs available, Suction available and Patient being monitored Patient Re-evaluated:Patient Re-evaluated prior to induction Oxygen Delivery Method: Circle System Utilized Preoxygenation: Pre-oxygenation with 100% oxygen Induction Type: IV induction Laryngoscope Size: Glidescope and 4 Grade View: Grade I Tube type: Oral Tube size: 8.0 mm Number of attempts: 1 Airway Equipment and Method: Stylet and Video-laryngoscopy Placement Confirmation: ETT inserted through vocal cords under direct vision,  positive ETCO2 and breath sounds checked- equal and bilateral Secured at: 24 cm Tube secured with: Tape Dental Injury: Teeth and Oropharynx as per pre-operative assessment

## 2018-04-27 NOTE — Interval H&P Note (Signed)
History and Physical Interval Note:  04/27/2018 6:47 AM  Chase Smith.  has presented today for surgery, with the diagnosis of MR AFIB  The various methods of treatment have been discussed with the patient and family. After consideration of risks, benefits and other options for treatment, the patient has consented to  Procedure(s) with comments: MINIMALLY INVASIVE MITRAL VALVE REPAIR (MVR) (Right) - Glutaraldehyde MINIMALLY INVASIVE MAZE PROCEDURE (N/A) TRANSESOPHAGEAL ECHOCARDIOGRAM (TEE) (N/A) as a surgical intervention .  The patient's history has been reviewed, patient examined, no change in status, stable for surgery.  I have reviewed the patient's chart and labs.  Questions were answered to the patient's satisfaction.     Rexene Alberts

## 2018-04-27 NOTE — Procedures (Signed)
Extubation Procedure Note  Patient Details:   Name: Matan Steen. DOB: 10-May-1946 MRN: [id number]   Airway Documentation:    Vent end date: 04/27/18 Vent end time: 1900   Evaluation  O2 sats: stable throughout Complications: No apparent complications Patient did tolerate procedure well. Bilateral Breath Sounds: Clear, Diminished   Yes   Patient extubated per order to 4L [place] with no apparent complications. Positive cuff leak was noted prior to extubation. Patient achieved NIF of -38 and VC of 1.7L with great effort. Patient is alert and oriented and is able to speak. Vitals are stable. RT will continue to monitor.   Sharyl Panchal Clyda Greener 04/27/2018, 7:14 PM

## 2018-04-27 NOTE — Transfer of Care (Signed)
Immediate Anesthesia Transfer of Care Note  Patient: Chase Smith.  Procedure(s) Performed: MINIMALLY INVASIVE MITRAL VALVE REPAIR (MVR) (Right Chest) MINIMALLY INVASIVE MAZE PROCEDURE (N/A ) TRANSESOPHAGEAL ECHOCARDIOGRAM (TEE) (N/A ) CLIPPING OF ATRIAL APPENDAGE (Chest)  Patient Location: ICU  Anesthesia Type:General  Level of Consciousness: sedated and unresponsive  Airway & Oxygen Therapy: Patient remains intubated per anesthesia plan  Post-op Assessment: Report given to RN and Post -op Vital signs reviewed and stable  Post vital signs: Reviewed and stable  Last Vitals:  Vitals Value Taken Time  BP 145/108 04/27/2018  2:55 PM  Temp 36 C 04/27/2018  2:56 PM  Pulse 79 04/27/2018  2:56 PM  Resp 15 04/27/2018  2:56 PM  SpO2 100 % 04/27/2018  2:56 PM  Vitals shown include unvalidated device data.  Last Pain:  Vitals:   04/27/18 4619  TempSrc:   PainSc: 0-No pain         Complications: No apparent anesthesia complications

## 2018-04-27 NOTE — Anesthesia Procedure Notes (Addendum)
Arterial Line Insertion Start/End10/29/2019 6:40 AM, 04/27/2018 6:55 AM Performed by: Roderic Palau, MD, Milford Cage, CRNA, CRNA  Patient location: Pre-op. Preanesthetic checklist: patient identified, IV checked, site marked, risks and benefits discussed, surgical consent, monitors and equipment checked, pre-op evaluation, timeout performed and anesthesia consent Lidocaine 1% used for infiltration Left, radial was placed Catheter size: 20 G Hand hygiene performed , maximum sterile barriers used  and Seldinger technique used Allen's test indicative of satisfactory collateral circulation Attempts: 1 Procedure performed without using ultrasound guided technique. Following insertion, dressing applied. Post procedure assessment: normal and unchanged  Patient tolerated the procedure well with no immediate complications.

## 2018-04-27 NOTE — Anesthesia Postprocedure Evaluation (Signed)
Anesthesia Post Note  Patient: Chase Smith.  Procedure(s) Performed: MINIMALLY INVASIVE MITRAL VALVE REPAIR (MVR) (Right Chest) MINIMALLY INVASIVE MAZE PROCEDURE (N/A ) TRANSESOPHAGEAL ECHOCARDIOGRAM (TEE) (N/A ) CLIPPING OF ATRIAL APPENDAGE (Chest)     Patient location during evaluation: SICU Anesthesia Type: General Level of consciousness: sedated Pain management: pain level controlled Vital Signs Assessment: post-procedure vital signs reviewed and stable Respiratory status: patient remains intubated per anesthesia plan Cardiovascular status: stable Postop Assessment: no apparent nausea or vomiting Anesthetic complications: no    Last Vitals:  Vitals:   04/27/18 1446 04/27/18 1511  BP: 115/69   Pulse: (!) 51 60  Resp: 15 (!) 0  Temp:  (!) 36 C  SpO2: 100%     Last Pain:  Vitals:   04/27/18 0633  TempSrc:   PainSc: 0-No pain                 Temika Sutphin,W. EDMOND

## 2018-04-27 NOTE — Anesthesia Procedure Notes (Signed)
Central Venous Catheter Insertion Performed by: Roderic Palau, MD, anesthesiologist Start/End10/29/2019 6:55 AM, 04/27/2018 7:20 AM Patient location: Pre-op. Preanesthetic checklist: patient identified, IV checked, site marked, risks and benefits discussed, surgical consent, monitors and equipment checked, pre-op evaluation, timeout performed and anesthesia consent Hand hygiene performed  and maximum sterile barriers used  PA cath was placed.Swan type:thermodilution PA Cath depth:55 Procedure performed without using ultrasound guided technique. Attempts: 1 Patient tolerated the procedure well with no immediate complications.

## 2018-04-27 NOTE — Brief Op Note (Signed)
04/27/2018  2:02 PM  PATIENT:  Chase Smith.  72 y.o. male  PRE-OPERATIVE DIAGNOSIS:  MR AFIB  POST-OPERATIVE DIAGNOSIS:  MR AFIB  PROCEDURE:  Procedure(s):  MINIMALLY INVASIVE MITRAL VALVE REPAIR  -Ring Annuloplasty with a 34 mm Sorin Memo 3D Ring -Triangular Resection of Posterior Leaflet -Placement of Goretex Neochords to Posterior Leaflet of p2 X 4  MINIMALLY INVASIVE MAZE PROCEDURE (N/A) -COX CRYO MINI MAZE -Placement of 45 mm Pro 2 AtriClip  TRANSESOPHAGEAL ECHOCARDIOGRAM (TEE) (N/A)   SURGEON:  Surgeon(s) and Role:    * Rexene Alberts, MD - Primary  PHYSICIAN ASSISTANT: Erin Barrett PA-C  ANESTHESIA:   general  EBL:  700 mL   BLOOD ADMINISTERED: CELLSAVER  DRAINS: Chest tubes Right Chest   LOCAL MEDICATIONS USED:  MARCAINE     SPECIMEN:  Source of Specimen:  Portion of Posterior Leafelt  DISPOSITION OF SPECIMEN:  PATHOLOGY  COUNTS:  YES  TOURNIQUET:  * No tourniquets in log *  DICTATION: .Dragon Dictation  PLAN OF CARE: Admit to inpatient   PATIENT DISPOSITION:  ICU - intubated and hemodynamically stable.   Delay start of Pharmacological VTE agent (>24hrs) due to surgical blood loss or risk of bleeding: yes

## 2018-04-28 ENCOUNTER — Encounter (HOSPITAL_COMMUNITY): Payer: Self-pay | Admitting: Thoracic Surgery (Cardiothoracic Vascular Surgery)

## 2018-04-28 ENCOUNTER — Inpatient Hospital Stay (HOSPITAL_COMMUNITY): Payer: Medicare Other

## 2018-04-28 LAB — POCT I-STAT, CHEM 8
BUN: 18 mg/dL (ref 8–23)
Calcium, Ion: 1.09 mmol/L — ABNORMAL LOW (ref 1.15–1.40)
Chloride: 105 mmol/L (ref 98–111)
Creatinine, Ser: 0.8 mg/dL (ref 0.61–1.24)
GLUCOSE: 118 mg/dL — AB (ref 70–99)
HCT: 33 % — ABNORMAL LOW (ref 39.0–52.0)
HEMOGLOBIN: 11.2 g/dL — AB (ref 13.0–17.0)
Potassium: 3.4 mmol/L — ABNORMAL LOW (ref 3.5–5.1)
SODIUM: 138 mmol/L (ref 135–145)
TCO2: 22 mmol/L (ref 22–32)

## 2018-04-28 LAB — MAGNESIUM
Magnesium: 2.4 mg/dL (ref 1.7–2.4)
Magnesium: 2.8 mg/dL — ABNORMAL HIGH (ref 1.7–2.4)

## 2018-04-28 LAB — CBC
HEMATOCRIT: 36.4 % — AB (ref 39.0–52.0)
HEMATOCRIT: 38 % — AB (ref 39.0–52.0)
HEMOGLOBIN: 12.5 g/dL — AB (ref 13.0–17.0)
Hemoglobin: 11.9 g/dL — ABNORMAL LOW (ref 13.0–17.0)
MCH: 29.6 pg (ref 26.0–34.0)
MCH: 30 pg (ref 26.0–34.0)
MCHC: 32.7 g/dL (ref 30.0–36.0)
MCHC: 32.9 g/dL (ref 30.0–36.0)
MCV: 90.5 fL (ref 80.0–100.0)
MCV: 91.3 fL (ref 80.0–100.0)
NRBC: 0 % (ref 0.0–0.2)
Platelets: 112 10*3/uL — ABNORMAL LOW (ref 150–400)
Platelets: 118 10*3/uL — ABNORMAL LOW (ref 150–400)
RBC: 4.02 MIL/uL — ABNORMAL LOW (ref 4.22–5.81)
RBC: 4.16 MIL/uL — ABNORMAL LOW (ref 4.22–5.81)
RDW: 13.1 % (ref 11.5–15.5)
RDW: 13.2 % (ref 11.5–15.5)
WBC: 9.9 10*3/uL (ref 4.0–10.5)
WBC: 12.6 10*3/uL — AB (ref 4.0–10.5)
nRBC: 0 % (ref 0.0–0.2)

## 2018-04-28 LAB — CREATININE, SERUM
Creatinine, Ser: 0.99 mg/dL (ref 0.61–1.24)
GFR calc Af Amer: >60 mL/min (ref >60)

## 2018-04-28 LAB — BASIC METABOLIC PANEL
ANION GAP: 7 (ref 5–15)
BUN: 17 mg/dL (ref 8–23)
CO2: 21 mmol/L — AB (ref 22–32)
Calcium: 7.4 mg/dL — ABNORMAL LOW (ref 8.9–10.3)
Chloride: 110 mmol/L (ref 98–111)
Creatinine, Ser: 0.88 mg/dL (ref 0.61–1.24)
GFR calc Af Amer: >60 mL/min (ref >60)
GFR calc non Af Amer: >60 mL/min (ref >60)
GLUCOSE: 124 mg/dL — AB (ref 70–99)
Potassium: 4.3 mmol/L (ref 3.5–5.1)
Sodium: 138 mmol/L (ref 135–145)

## 2018-04-28 LAB — GLUCOSE, CAPILLARY
GLUCOSE-CAPILLARY: 123 mg/dL — AB (ref 70–99)
GLUCOSE-CAPILLARY: 117 mg/dL — AB (ref 70–99)
GLUCOSE-CAPILLARY: 119 mg/dL — AB (ref 70–99)
Glucose-Capillary: 116 mg/dL — ABNORMAL HIGH (ref 70–99)
Glucose-Capillary: 106 mg/dL — ABNORMAL HIGH (ref 70–99)

## 2018-04-28 MED ORDER — WARFARIN VIDEO: Freq: Once | Status: DC

## 2018-04-28 MED ORDER — ENOXAPARIN SODIUM 40 MG/0.4ML ~~LOC~~ SOLN
40.0000 mg | Freq: Every day | SUBCUTANEOUS | Status: DC
  Administered 2018-04-29 – 2018-05-01 (×3): 40 mg via SUBCUTANEOUS
  Filled 2018-04-28 (×3): qty 0.4

## 2018-04-28 MED ORDER — ROSUVASTATIN CALCIUM 10 MG PO TABS
5.0000 mg | ORAL_TABLET | ORAL | Status: DC
  Administered 2018-04-30: 5 mg via ORAL
  Filled 2018-04-28: qty 1

## 2018-04-28 MED ORDER — WARFARIN SODIUM 2.5 MG PO TABS
2.5000 mg | ORAL_TABLET | Freq: Every day | ORAL | Status: DC
  Administered 2018-04-28 – 2018-04-29 (×2): 2.5 mg via ORAL
  Filled 2018-04-28 (×2): qty 1

## 2018-04-28 MED ORDER — ASPIRIN EC 325 MG PO TBEC
325.0000 mg | DELAYED_RELEASE_TABLET | Freq: Every day | ORAL | Status: AC
  Administered 2018-04-28: 325 mg via ORAL
  Filled 2018-04-28: qty 1

## 2018-04-28 MED ORDER — INSULIN ASPART 100 UNIT/ML ~~LOC~~ SOLN: 0.0000 [IU] | SUBCUTANEOUS | Status: DC

## 2018-04-28 MED ORDER — WARFARIN - PHYSICIAN DOSING INPATIENT
Freq: Every day | Status: DC
  Administered 2018-04-30 – 2018-05-01 (×2)

## 2018-04-28 MED ORDER — COUMADIN BOOK
Freq: Once | Status: AC
  Administered 2018-04-28: 18:00:00
  Filled 2018-04-28: qty 1

## 2018-04-28 MED ORDER — FUROSEMIDE 10 MG/ML IJ SOLN
20.0000 mg | Freq: Two times a day (BID) | INTRAMUSCULAR | Status: DC
  Administered 2018-04-28 – 2018-04-30 (×5): 20 mg via INTRAVENOUS
  Filled 2018-04-28 (×4): qty 2

## 2018-04-28 MED ORDER — ASPIRIN EC 81 MG PO TBEC
81.0000 mg | DELAYED_RELEASE_TABLET | Freq: Every day | ORAL | Status: DC
  Administered 2018-04-29 – 2018-05-02 (×4): 81 mg via ORAL
  Filled 2018-04-28 (×4): qty 1

## 2018-04-28 MED ORDER — POTASSIUM CHLORIDE 10 MEQ/50ML IV SOLN
10.0000 meq | INTRAVENOUS | Status: AC
  Administered 2018-04-28 (×3): 10 meq via INTRAVENOUS
  Filled 2018-04-28 (×2): qty 50

## 2018-04-28 MED FILL — Sodium Chloride IV Soln 0.9%: INTRAVENOUS | Qty: 2000 | Status: AC

## 2018-04-28 MED FILL — Heparin Sodium (Porcine) Inj 1000 Unit/ML: INTRAMUSCULAR | Qty: 2500 | Status: AC

## 2018-04-28 MED FILL — Heparin Sodium (Porcine) Inj 1000 Unit/ML: INTRAMUSCULAR | Qty: 10 | Status: AC

## 2018-04-28 MED FILL — Sodium Bicarbonate IV Soln 8.4%: INTRAVENOUS | Qty: 50 | Status: AC

## 2018-04-28 MED FILL — Electrolyte-R (PH 7.4) Solution: INTRAVENOUS | Qty: 3000 | Status: AC

## 2018-04-28 MED FILL — Heparin Sodium (Porcine) Inj 1000 Unit/ML: INTRAMUSCULAR | Qty: 30 | Status: AC

## 2018-04-28 MED FILL — Magnesium Sulfate Inj 50%: INTRAMUSCULAR | Qty: 10 | Status: AC

## 2018-04-28 MED FILL — Lidocaine HCl(Cardiac) IV PF Soln Pref Syr 100 MG/5ML (2%): INTRAVENOUS | Qty: 25 | Status: AC

## 2018-04-28 MED FILL — Mannitol IV Soln 20%: INTRAVENOUS | Qty: 500 | Status: AC

## 2018-04-28 MED FILL — Potassium Chloride Inj 2 mEq/ML: INTRAVENOUS | Qty: 40 | Status: AC

## 2018-04-28 NOTE — Progress Notes (Signed)
Patient ID: Chase Smith., male   DOB: Jun 02, 1946, 72 y.o.   MRN: [id number] EVENING ROUNDS NOTE :     [address]       [place] [address]             [phone]                 1 Day Post-Op Procedure(s) (LRB): MINIMALLY INVASIVE MITRAL VALVE REPAIR (MVR) (Right) MINIMALLY INVASIVE MAZE PROCEDURE (N/A) TRANSESOPHAGEAL ECHOCARDIOGRAM (TEE) (N/A) CLIPPING OF ATRIAL APPENDAGE  Total Length of Stay:  LOS: 1 day  BP 107/73   Pulse 69   Temp 99.8 F (37.7 C) (Oral)   Resp (!) 21   Ht 6' (1.829 m)   Wt 99.8 kg   SpO2 99%   BMI 29.84 kg/m   .Intake/Output      10/30 0701 - 10/31 0700   P.O. 240   I.V. (mL/kg) 121.8 (1.2)   Blood    IV Piggyback 7.7   Total Intake(mL/kg) 369.5 (3.7)   Urine (mL/kg/hr) 1600 (1.3)   Blood    Chest Tube 300   Total Output 1900   Net -1530.5         . sodium chloride    . cefUROXime (ZINACEF)  IV 200 mL/hr at 04/28/18 1800  . lactated ringers    . lactated ringers Stopped (04/28/18 1755)  . potassium chloride 10 mEq (04/28/18 1855)     Lab Results  Component Value Date   WBC 12.6 (H) 04/28/2018   HGB 11.2 (L) 04/28/2018   HCT 33.0 (L) 04/28/2018   PLT 118 (L) 04/28/2018   GLUCOSE 118 (H) 04/28/2018   ALT 16 04/23/2018   AST 17 04/23/2018   NA 138 04/28/2018   K 3.4 (L) 04/28/2018   CL 105 04/28/2018   CREATININE 0.80 04/28/2018   BUN 18 04/28/2018   CO2 21 (L) 04/28/2018   INR 1.30 04/27/2018   HGBA1C 5.0 04/23/2018   Stable  Pacing 70 now - apaced Just got lasix   Grace Isaac MD  Beeper [phone] Office [phone] 04/28/2018 7:18 PM

## 2018-04-28 NOTE — Progress Notes (Addendum)
      [address]       [place] [address]             [phone]        CARDIOTHORACIC SURGERY PROGRESS NOTE   R1 Day Post-Op Procedure(s) (LRB): MINIMALLY INVASIVE MITRAL VALVE REPAIR (MVR) (Right) MINIMALLY INVASIVE MAZE PROCEDURE (N/A) TRANSESOPHAGEAL ECHOCARDIOGRAM (TEE) (N/A) CLIPPING OF ATRIAL APPENDAGE  Subjective: Looks good and feels well.  No pain except with deep breath or cough.  No SOB.  No nausea.    Objective: Vital signs: BP Readings from Last 1 Encounters:  04/28/18 94/70   Pulse Readings from Last 1 Encounters:  04/28/18 71   Resp Readings from Last 1 Encounters:  04/28/18 16   Temp Readings from Last 1 Encounters:  04/28/18 98.6 F (37 C)    Hemodynamics: PAP: (18-35)/(7-23) 27/12 CO:  [3.1 L/min-6.5 L/min] 5.6 L/min CI:  [1.4 L/min/m2-3 L/min/m2] 2.5 L/min/m2  Physical Exam:  Rhythm:   Sinus brady - AAI paced  Breath sounds: clear  Heart sounds:  RRR w/out murmur  Incisions:  Dressings dry, intact  Abdomen:  Soft, non-distended, non-tender  Extremities:  Warm, well-perfused  Chest tubes:  low volume thin serosanguinous output, no air leak    Intake/Output from previous day: 10/29 0701 - 10/30 0700 In: 4668 [I.V.:3349.2; Blood:500; IV Piggyback:818.8] Out: 2695 [Urine:1625; Blood:700; Chest Tube:370] Intake/Output this shift: Total I/O In: 20 [I.V.:20] Out: -   Lab Results:  CBC: Recent Labs    04/27/18 2134 04/27/18 2136 04/28/18 0425  WBC 10.3  --  9.9  HGB 12.7* 11.9* 11.9*  HCT 38.5* 35.0* 36.4*  PLT 124*  --  112*    BMET:  Recent Labs    04/27/18 2136 04/28/18 0425  NA 136 138  K 3.7 4.3  CL 114* 110  CO2  --  21*  GLUCOSE 160* 124*  BUN 19 17  CREATININE 0.80 0.88  CALCIUM  --  7.4*     PT/INR:   Recent Labs    04/27/18 1451  LABPROT 16.0*  INR 1.30    CBG (last 3)  Recent Labs    04/27/18 2037 04/27/18 2320 04/28/18 0328  GLUCAP 123* 119* 116*    ABG    Component  Value Date/Time   PHART 7.339 (L) 04/27/2018 2132   PCO2ART 33.8 04/27/2018 2132   PO2ART 76.0 (L) 04/27/2018 2132   HCO3 18.2 (L) 04/27/2018 2132   TCO2 21 (L) 04/27/2018 2136   ACIDBASEDEF 7.0 (H) 04/27/2018 2132   O2SAT 94.0 04/27/2018 2132    CXR: Looks good.  Clear w/ mild bibasilar atelectasis  EKG: Sinus brady w/out acute ischemic changes    Assessment/Plan: S/P Procedure(s) (LRB): MINIMALLY INVASIVE MITRAL VALVE REPAIR (MVR) (Right) MINIMALLY INVASIVE MAZE PROCEDURE (N/A) TRANSESOPHAGEAL ECHOCARDIOGRAM (TEE) (N/A) CLIPPING OF ATRIAL APPENDAGE  Doing well POD1 Maintaining NSR w/ stable hemodynamics, no drips Breathing comfortably w/ O2 sats 98% on 2 L/min, CXR clear Chronic diastolic CHF with expected post-op volume excess, weight 3.5 kg > preop Expected post op acute blood loss anemia, mild Expected post op atelectasis, mild Post op thrombocytopenia, mild   Mobilize  D/C lines   Continue AAI pacing and hold beta blockers for now  Start Coumadin slowly  Gentle diuresis   Rexene Alberts, MD 04/28/2018 8:13 AM

## 2018-04-28 NOTE — Plan of Care (Signed)
  Problem: Cardiac: Goal: Will achieve and/or maintain hemodynamic stability Outcome: Progressing  CI >2 Problem: Respiratory: Goal: Respiratory status will improve Outcome: Progressing  Pt extubated 10/29 Problem: Urinary Elimination: Goal: Ability to achieve and maintain adequate renal perfusion and functioning will improve Outcome: Progressing  UOP WNL

## 2018-04-28 NOTE — Discharge Instructions (Addendum)
Discharge Instructions:  1. You may shower, please wash incisions daily with soap and water and keep dry.  If you wish to cover wounds with dressing you may do so but please keep clean and change daily.  No tub baths or swimming until incisions have completely healed.  If your incisions become red or develop any drainage please call our office at [phone]  2. No Driving until cleared by Dr. Owen's office and you are no longer using narcotic pain medications  3. Monitor your weight daily.. Please use the same scale and weigh at same time... If you gain 5-10 lbs in 48 hours with associated lower extremity swelling, please contact our office at [phone]  4. Fever of 101.5 for at least 24 hours with no source, please contact our office at [phone]  5. Activity- up as tolerated, please walk at least 3 times per day.  Avoid strenuous activity, no lifting, pushing, or pulling with your arms over 8-10 lbs for a minimum of 6 weeks  6. If any questions or concerns arise, please do not hesitate to contact our office at [phone] Information on my medicine - Coumadin   (Warfarin)   Why was Coumadin prescribed for you? Coumadin was prescribed for you because you have a blood clot or a medical condition that can cause an increased risk of forming blood clots. Blood clots can cause serious health problems by blocking the flow of blood to the heart, lung, or brain. Coumadin can prevent harmful blood clots from forming. As a reminder your indication for Coumadin is:   Blood Clot Prevention After Heart Valve Surgery  What test will check on my response to Coumadin? While on Coumadin (warfarin) you will need to have an INR test regularly to ensure that your dose is keeping you in the desired range. The INR (international normalized ratio) number is calculated from the result of the laboratory test called prothrombin time (PT).  If an INR APPOINTMENT HAS NOT ALREADY BEEN MADE FOR YOU please  schedule an appointment to have this lab work done by your health care provider within 7 days. Your INR goal is usually a number between:  2 to 3 or your provider may give you a more narrow range like 2-2.5.  Ask your health care provider during an office visit what your goal INR is.  What  do you need to  know  About  COUMADIN? Take Coumadin (warfarin) exactly as prescribed by your healthcare provider about the same time each day.  DO NOT stop taking without talking to the doctor who prescribed the medication.  Stopping without other blood clot prevention medication to take the place of Coumadin may increase your risk of developing a new clot or stroke.  Get refills before you run out.  What do you do if you miss a dose? If you miss a dose, take it as soon as you remember on the same day then continue your regularly scheduled regimen the next day.  Do not take two doses of Coumadin at the same time.  Important Safety Information A possible side effect of Coumadin (Warfarin) is an increased risk of bleeding. You should call your healthcare provider right away if you experience any of the following: ? Bleeding from an injury or your nose that does not stop. ? Unusual colored urine (red or dark brown) or unusual colored stools (red or black). ? Unusual bruising for unknown reasons. ? A serious fall or if you hit your head (even if   there is no bleeding).  Some foods or medicines interact with Coumadin (warfarin) and might alter your response to warfarin. To help avoid this: ? Eat a balanced diet, maintaining a consistent amount of Vitamin K. ? Notify your provider about major diet changes you plan to make. ? Avoid alcohol or limit your intake to 1 drink for women and 2 drinks for men per day. (1 drink is 5 oz. wine, 12 oz. beer, or 1.5 oz. liquor.)  Make sure that ANY health care provider who prescribes medication for you knows that you are taking Coumadin (warfarin).  Also make sure the  healthcare provider who is monitoring your Coumadin knows when you have started a new medication including herbals and non-prescription products.  Coumadin (Warfarin)  Major Drug Interactions  Increased Warfarin Effect Decreased Warfarin Effect  Alcohol (large quantities) Antibiotics (esp. Septra/Bactrim, Flagyl, Cipro) Amiodarone (Cordarone) Aspirin (ASA) Cimetidine (Tagamet) Megestrol (Megace) NSAIDs (ibuprofen, naproxen, etc.) Piroxicam (Feldene) Propafenone (Rythmol SR) Propranolol (Inderal) Isoniazid (INH) Posaconazole (Noxafil) Barbiturates (Phenobarbital) Carbamazepine (Tegretol) Chlordiazepoxide (Librium) Cholestyramine (Questran) Griseofulvin Oral Contraceptives Rifampin Sucralfate (Carafate) Vitamin K   Coumadin (Warfarin) Major Herbal Interactions  Increased Warfarin Effect Decreased Warfarin Effect  Garlic Ginseng Ginkgo biloba Coenzyme Q10 Green tea St. John's wort    Coumadin (Warfarin) FOOD Interactions  Eat a consistent number of servings per week of foods HIGH in Vitamin K (1 serving =  cup)  Collards (cooked, or boiled & drained) Kale (cooked, or boiled & drained) Mustard greens (cooked, or boiled & drained) Parsley *serving size only =  cup Spinach (cooked, or boiled & drained) Swiss chard (cooked, or boiled & drained) Turnip greens (cooked, or boiled & drained)  Eat a consistent number of servings per week of foods MEDIUM-HIGH in Vitamin K (1 serving = 1 cup)  Asparagus (cooked, or boiled & drained) Broccoli (cooked, boiled & drained, or raw & chopped) Brussel sprouts (cooked, or boiled & drained) *serving size only =  cup Lettuce, raw (green leaf, endive, romaine) Spinach, raw Turnip greens, raw & chopped   These websites have more information on Coumadin (warfarin):  www.coumadin.com; www.ahrq.gov/consumer/coumadin.htm;    

## 2018-04-29 ENCOUNTER — Inpatient Hospital Stay (HOSPITAL_COMMUNITY): Payer: Medicare Other

## 2018-04-29 LAB — CBC
HCT: 31.9 % — ABNORMAL LOW (ref 39.0–52.0)
Hemoglobin: 10.1 g/dL — ABNORMAL LOW (ref 13.0–17.0)
MCH: 29.7 pg (ref 26.0–34.0)
MCHC: 31.7 g/dL (ref 30.0–36.0)
MCV: 93.8 fL (ref 80.0–100.0)
PLATELETS: 84 10*3/uL — AB (ref 150–400)
RBC: 3.4 MIL/uL — ABNORMAL LOW (ref 4.22–5.81)
RDW: 13.2 % (ref 11.5–15.5)
WBC: 10.5 10*3/uL (ref 4.0–10.5)
nRBC: 0 % (ref 0.0–0.2)

## 2018-04-29 LAB — GLUCOSE, CAPILLARY
GLUCOSE-CAPILLARY: 117 mg/dL — AB (ref 70–99)
GLUCOSE-CAPILLARY: 96 mg/dL (ref 70–99)
GLUCOSE-CAPILLARY: 106 mg/dL — AB (ref 70–99)
Glucose-Capillary: 104 mg/dL — ABNORMAL HIGH (ref 70–99)
Glucose-Capillary: 105 mg/dL — ABNORMAL HIGH (ref 70–99)
Glucose-Capillary: 111 mg/dL — ABNORMAL HIGH (ref 70–99)

## 2018-04-29 LAB — PROTIME-INR
INR: 1.25
PROTHROMBIN TIME: 15.6 s — AB (ref 11.4–15.2)

## 2018-04-29 LAB — BASIC METABOLIC PANEL
Anion gap: 3 — ABNORMAL LOW (ref 5–15)
BUN: 14 mg/dL (ref 8–23)
CALCIUM: 6.5 mg/dL — AB (ref 8.9–10.3)
CO2: 21 mmol/L — ABNORMAL LOW (ref 22–32)
CREATININE: 0.75 mg/dL (ref 0.61–1.24)
Chloride: 113 mmol/L — ABNORMAL HIGH (ref 98–111)
GFR calc Af Amer: >60 mL/min (ref >60)
Glucose, Bld: 107 mg/dL — ABNORMAL HIGH (ref 70–99)
Potassium: 3.3 mmol/L — ABNORMAL LOW (ref 3.5–5.1)
SODIUM: 137 mmol/L (ref 135–145)

## 2018-04-29 MED ORDER — SODIUM CHLORIDE 0.9% FLUSH
3.0000 mL | Freq: Two times a day (BID) | INTRAVENOUS | Status: DC
  Administered 2018-04-29: 10 mL via INTRAVENOUS
  Administered 2018-04-29 – 2018-05-02 (×6): 3 mL via INTRAVENOUS

## 2018-04-29 MED ORDER — INSULIN ASPART 100 UNIT/ML ~~LOC~~ SOLN: 0.0000 [IU] | Freq: Three times a day (TID) | SUBCUTANEOUS | Status: DC

## 2018-04-29 MED ORDER — POTASSIUM CHLORIDE 10 MEQ/50ML IV SOLN
10.0000 meq | INTRAVENOUS | Status: AC
  Administered 2018-04-29 (×2): 10 meq via INTRAVENOUS
  Filled 2018-04-29 (×2): qty 50

## 2018-04-29 MED ORDER — POTASSIUM CHLORIDE 10 MEQ/50ML IV SOLN
10.0000 meq | INTRAVENOUS | Status: AC
  Administered 2018-04-29 (×5): 10 meq via INTRAVENOUS
  Filled 2018-04-29 (×4): qty 50

## 2018-04-29 MED ORDER — KETOROLAC TROMETHAMINE 15 MG/ML IJ SOLN
15.0000 mg | Freq: Once | INTRAMUSCULAR | Status: AC | PRN
  Administered 2018-04-29: 15 mg via INTRAVENOUS
  Filled 2018-04-29: qty 1

## 2018-04-29 MED ORDER — MOVING RIGHT ALONG BOOK
Freq: Once | Status: AC
  Administered 2018-04-29: 12:00:00
  Filled 2018-04-29: qty 1

## 2018-04-29 MED ORDER — SODIUM CHLORIDE 0.9% FLUSH
3.0000 mL | INTRAVENOUS | Status: DC | PRN
  Administered 2018-04-29: 3 mL via INTRAVENOUS
  Filled 2018-04-29: qty 3

## 2018-04-29 MED ORDER — SODIUM CHLORIDE 0.9 % IV SOLN: 250.0000 mL | INTRAVENOUS | Status: DC | PRN

## 2018-04-29 NOTE — Progress Notes (Addendum)
[facility] DAILY ICU PROGRESS NOTE                   [address]            [place] [address]          [phone]   2 Days Post-Op Procedure(s) (LRB): MINIMALLY INVASIVE MITRAL VALVE REPAIR (MVR) (Right) MINIMALLY INVASIVE MAZE PROCEDURE (N/A) TRANSESOPHAGEAL ECHOCARDIOGRAM (TEE) (N/A) CLIPPING OF ATRIAL APPENDAGE  Total Length of Stay:  LOS: 2 days   Subjective:  No specific complaints.  Feeling pretty good, looks good.  Objective: Vital signs in last 24 hours: Temp:  [97.5 F (36.4 C)-99.8 F (37.7 C)] 99.8 F (37.7 C) (10/31 0400) Pulse Rate:  [67-77] 70 (10/31 0700) Cardiac Rhythm: Atrial paced (10/31 0400) Resp:  [13-29] 13 (10/31 0700) BP: (92-134)/(65-86) 92/79 (10/31 0700) SpO2:  [96 %-100 %] 96 % (10/31 0700) Weight:  [97.1 kg] 97.1 kg (10/31 0500)  Filed Weights   04/27/18 0633 04/28/18 0600 04/29/18 0500  Weight: 96.3 kg 99.8 kg 97.1 kg    Weight change: -2.7 kg   Intake/Output from previous day: 10/30 0701 - 10/31 0700 In: 799.5 [P.O.:240; I.V.:202.9; IV Piggyback:356.6] Out: 2840 [Urine:2250; Chest Tube:590]  Current Meds: Scheduled Meds: . acetaminophen  1,000 mg Oral Q6H  . aspirin EC  81 mg Oral Daily  . bisacodyl  10 mg Oral Daily   Or  . bisacodyl  10 mg Rectal Daily  . docusate sodium  200 mg Oral Daily  . enoxaparin (LOVENOX) injection  40 mg Subcutaneous QHS  . furosemide  20 mg Intravenous BID  . insulin aspart  0-24 Units Subcutaneous TID AC & HS  . moving right along book   Does not apply Once  . mupirocin ointment  1 application Nasal BID  . pantoprazole  40 mg Oral Daily  . [START ON 04/30/2018] rosuvastatin  5 mg Oral Q M,W,F  . sodium chloride flush  3 mL Intravenous Q12H  . warfarin  2.5 mg Oral q1800  . warfarin   Does not apply Once  . Warfarin - Physician Dosing Inpatient   Does not apply q1800   Continuous Infusions: . sodium chloride    . sodium chloride    . lactated ringers    . potassium chloride 10  mEq (04/29/18 0739)   PRN Meds:.sodium chloride, metoprolol tartrate, morphine injection, ondansetron (ZOFRAN) IV, oxyCODONE, sodium chloride flush, traMADol  General appearance: alert, cooperative and no distress Heart: regular rate and rhythm Lungs: clear to auscultation bilaterally Abdomen: soft, non-tender; bowel sounds normal; no masses,  no organomegaly Extremities: edema trace Wound: clean and dry, aquacel in place on thoracotomy  Lab Results: CBC: Recent Labs    04/28/18 1702 04/28/18 1817 04/29/18 0354  WBC 12.6*  --  10.5  HGB 12.5* 11.2* 10.1*  HCT 38.0* 33.0* 31.9*  PLT 118*  --  84*   BMET:  Recent Labs    04/28/18 0425  04/28/18 1817 04/29/18 0354  NA 138  --  138 137  K 4.3  --  3.4* 3.3*  CL 110  --  105 113*  CO2 21*  --   --  21*  GLUCOSE 124*  --  118* 107*  BUN 17  --  18 14  CREATININE 0.88   < > 0.80 0.75  CALCIUM 7.4*  --   --  6.5*   < > = values in this interval not displayed.    CMET: Lab  Results  Component Value Date   WBC 10.5 04/29/2018   HGB 10.1 (L) 04/29/2018   HCT 31.9 (L) 04/29/2018   PLT 84 (L) 04/29/2018   GLUCOSE 107 (H) 04/29/2018   ALT 16 04/23/2018   AST 17 04/23/2018   NA 137 04/29/2018   K 3.3 (L) 04/29/2018   CL 113 (H) 04/29/2018   CREATININE 0.75 04/29/2018   BUN 14 04/29/2018   CO2 21 (L) 04/29/2018   INR 1.25 04/29/2018   HGBA1C 5.0 04/23/2018      PT/INR:  Recent Labs    04/29/18 0354  LABPROT 15.6*  INR 1.25   Radiology: Dg Chest Port 1 View  Result Date: 04/29/2018 CLINICAL DATA:  Status post mitral valve repair EXAM: PORTABLE CHEST 1 VIEW COMPARISON:  04/28/2018 FINDINGS: Postsurgical changes are noted. Right jugular sheath remains in place. The Swan-Ganz catheter has been removed. Cardiac shadow is enlarged but stable. The lungs are well aerated with left basilar atelectasis slightly increased when compared with the prior exam. Improved aeration in the right base is noted. Right chest tube and  pericardial drain are noted. No pneumothorax is seen. IMPRESSION: Slight increase in left basilar atelectasis. The remainder of the exam is relatively stable. Electronically Signed   By: Inez Catalina M.D.   On: 04/29/2018 07:53     Assessment/Plan: S/P Procedure(s) (LRB): MINIMALLY INVASIVE MITRAL VALVE REPAIR (MVR) (Right) MINIMALLY INVASIVE MAZE PROCEDURE (N/A) TRANSESOPHAGEAL ECHOCARDIOGRAM (TEE) (N/A) CLIPPING OF ATRIAL APPENDAGE  1. CV- Sinus bradycardia, can hopefully resume home Coreg soon, off all drips 2. INR 1.25, will repeat dose of 2.5 mg daily 3. Pulm- CT output decreasing, CXR is free from pleural effusions, pneumothorax- can hopefully d/c chest tube tomorrow 4. Renal- creatinine WNL, weight is elevated on IV Lasix, Hypokalemic, receiving IV potassium per ICU protocol 5. Expected post operative blood loss anemia, mild at 10.1 6. Expected thrombocytopenia, 84, monitor 7. Dispo- patient stable, sinus brady, will hold off on BB until HR improves, continue coumadin, diurese, watch platelet count, transfer to [place] 04/29/2018 8:16 AM    I have seen and examined the patient and agree with the assessment and plan as outlined.  Rexene Alberts, MD 04/29/2018

## 2018-04-29 NOTE — Discharge Summary (Addendum)
Physician Discharge Summary  Patient ID: Chase Smith. MRN: [id number] DOB/AGE: May 22, 1946 72 y.o.  Admit date: 04/27/2018 Discharge date: 05/02/2018  Admission Diagnoses:  Patient Active Problem List   Diagnosis Date Noted  . Streptococcus viridans infection 01/07/2018  . Severe mitral valve regurgitation 01/07/2018  . PAF (paroxysmal atrial fibrillation) ([facility]) 05/13/2017  . Benign essential HTN 02/07/2015  . MVP (mitral valve prolapse)   . Dilated aortic root ([place])   . Left ventricular diastolic dysfunction, NYHA class 2   . Meckel's diverticulitis 08/27/2012  . Abdominal mass, RLQ (right lower quadrant) 07/27/2012   Discharge Diagnoses:   Patient Active Problem List   Diagnosis Date Noted  . S/P minimally invasive mitral valve repair + maze procedure 04/27/2018  . S/P minimally invasive maze operation for atrial fibrillation 04/27/2018  . Streptococcus viridans infection 01/07/2018  . Severe mitral valve regurgitation 01/07/2018  . PAF (paroxysmal atrial fibrillation) ([place]) 05/13/2017  . Benign essential HTN 02/07/2015  . MVP (mitral valve prolapse)   . Dilated aortic root ([place])   . Left ventricular diastolic dysfunction, NYHA class 2   . Meckel's diverticulitis 08/27/2012  . Abdominal mass, RLQ (right lower quadrant) 07/27/2012   Discharged Condition: good  History of Present Illness:  Chase Smith is a 72 yo white male with history of mitral valve prolapse with mitral regurgitation, recurrent paroxysmal atrial fibrillation, and Hypertension.   Patient states that he was first noted to have a heart murmur on physical exam in 1995. He was followed for many years by his primary care physician, and more recently he has been followed by Dr. Radford Pax. Previous echocardiograms have documented the presence of mitral valve prolapse with what was felt to be moderate mitral regurgitation. Several years ago he began to experience episodes of tachycardia palpitations and shortness  of breath. He was diagnosed with paroxysmal atrial fibrillation. He has been anticoagulated using Eliquis and for the most part he remains in sinus rhythm. The patient was hospitalized in July 2019 with Streptococcus viridans bacteremia. Transesophageal echocardiogram performed at that time demonstrated the presence of mitral valve prolapse with severe mitral regurgitation. No definite vegetation was identified. The patient was treated for presumed bacterial endocarditis with a 6-week course of intravenous antibiotics. Symptoms of myalgias, fevers, night sweats, and headaches all resolved. He was evaluated by his dentist asan outpatient and noted to have a root abscess requiring tooth extraction and a second problematic tooth requiring root canal. These were performed uneventfully. The patient was subsequently referred to Dr. Burt Knack for consultation. He subsequently underwent left and right heart catheterization on March 24, 2018. Catheterization confirmed the presence of severe mitral regurgitation. The patient was noted to have normal coronary arteries with no significant coronary artery disease. Right heart pressures and left ventricular end-diastolic pressure was normal.  It was felt the patient would require surgical intervention on his Mitral Valve.  He was referred to [facility] for consultation.  He was evaluated by Dr. Roxy Manns who felt patient would benefit from Minimally Invasive Mitral Valve Repair and MAZE procedure.  The risks and benefits of the procedure were explained to the patient and he was agreeable to proceed.  Hospital Course:   Chase Smith presented to [facility] on 04/27/2018.  He was taken to the operating room and underwent Minimally Invasive Mitral Valve Repair, Mini MAZE procedure, and Clipping of LA Appendage.  He tolerated the procedure without difficulty and was taken to the SICU in stable condition.  The  patient was extubated the  evening of surgery.  During his stay in the SICU the patient was started on coumadin slowly for his Mitral Valve Repair.  He was bradycardic and required back up pacing.  His arterial line was removed without difficulty.  He was started on diuretics for mild volume overload.  He was hypokalemic and supplemented accordingly.  He was ambulating without significant difficulty.  He was felt to be stable for transfer to the telemetry unit on 04/29/2018.  He continued to make progress.  His chest tube output decreased and his chest tubes were removed on 04/30/2018.  He maintained NSR and his pacing wires were removed without difficulty.  He remains on Coumadin at 5 mg daily.  His most recent INR is up to 1.29 with a goal range of 2.5 (for 3 months, per Dr. Roxy Manns).  He has requested his PT/INR levels be monitored at [facility] in [place].  They have agreed to do this.  He will be evaluated by a PA on Tuesday November, 5, 2019.  He continues to ambulate independently.  His incisions are healing without evidence of infection.  He is medically stable for discharge home today.    Significant Diagnostic Studies: cardiac graphics:   Echocardiogram:   Study Conclusions  - Left ventricle: The cavity size was normal. Wall thickness was   increased in a pattern of mild LVH. Systolic function was normal.   The estimated ejection fraction was in the range of 60% to 65%.   Doppler parameters are consistent with both elevated ventricular   end-diastolic filling pressure and elevated left atrial filling   pressure. - Mitral valve: Probable flail segment to posterior leaflet with   anteriorly directed MR> Only appears moderate by this current TTE   but morphologically suggests its severe. There was moderate   regurgitation. - Atrial septum: No defect or patent foramen ovale was identified.  Treatments: surgery:    Minimally-Invasive Mitral Valve Repair             Complex valvuloplasty including  triangular resection of flail segment (P2) of posterior leaflet             Artificial Gore-tex neochord placement x8             Sorin Memo 4D Ring Annuloplasty (size 60mm, catalog #[id number], serial [id number])   Minimally-Invasive Maze Procedure             Complete bilateral atrial lesion set using cryothermy and bipolar radiofrequency ablation             Clipping of Left Atrial Appendage (Atricure Pro245 left atrial clip, size 45 mm)  Discharge Exam:  Blood pressure 110/87, pulse 85, temperature 98 F (36.7 C), temperature source Oral, resp. rate 14, height 6' (1.829 m), weight 94.9 kg, SpO2 97 %. Cardiovascular: RRR, no murmur. Pulmonary: Clear to auscultation bilaterally Abdomen: Soft, non tender, bowel sounds present. Extremities: No lower extremity edema. Wounds: Clean and dry.  No erythema or signs of infection. Ecchymosis right anterior chest wound.   Disposition: Discharge disposition: 01-Home or Self Care     Home  Discharge Medications:  The patient has been discharged on:   1.Beta Blocker:  Yes [ X  ]                              No   [   ]  If No, reason:  2.Ace Inhibitor/ARB: Yes [ X  ]                                     No  [    ]                                     If No, reason:  3.Statin:   Yes [ X  ]                  No  [   ]                  If No, reason:  4.Ecasa:  Yes  [ X  ]                  No   [   ]                  If No, reason:     Discharge Instructions    Amb Referral to Cardiac Rehabilitation   Complete by:  As directed    [facility] in [place], [place]   Diagnosis:  Valve Repair   Valve:  Mitral   Amb Referral to Cardiac Rehabilitation   Complete by:  As directed    Diagnosis:  Valve Repair   Valve:  Mitral Comment - Mini with MAZE procedure     Allergies as of 05/02/2018      Reactions   Ace Inhibitors Cough   Sulfa Antibiotics Other (See Comments)   Severe lethargy   Antihistamines,  Chlorpheniramine-type Other (See Comments)   Trouble urinating. Patient st "all antihistamines" cause this.       Medication List    STOP taking these medications   ELIQUIS 5 MG Tabs tablet Generic drug:  apixaban     TAKE these medications   acetaminophen 500 MG tablet Commonly known as:  TYLENOL Take 500-1,000 mg by mouth every 6 (six) hours as needed for moderate pain or headache.   amoxicillin 500 MG tablet Commonly known as:  AMOXIL Take 4 tablets by mouth 30-60 minutes prior to dental procedures What changed:    how much to take  how to take this  when to take this  additional instructions   aspirin 81 MG EC tablet Take 1 tablet (81 mg total) by mouth daily.   carvedilol 6.25 MG tablet Commonly known as:  COREG Take 1 tablet (6.25 mg total) by mouth 2 (two) times daily with a meal.   CO Q-10 PO Take 1 capsule by mouth daily.   flunisolide 25 MCG/ACT (0.025%) Soln Commonly known as:  NASALIDE Place 2 sprays into the nose daily as needed (for allergies).   furosemide 40 MG tablet Commonly known as:  LASIX Take 1 tablet (40 mg total) by mouth daily. For 5 days then stop.   guaiFENesin 600 MG 12 hr tablet Commonly known as:  MUCINEX Take 1 tablet (600 mg total) by mouth 2 (two) times daily as needed for cough or to loosen phlegm.   losartan 50 MG tablet Commonly known as:  COZAAR Take 25 mg by mouth daily.   oxyCODONE 5 MG immediate release tablet Commonly known as:  Oxy IR/ROXICODONE Take 5 mg by mouth every 4-6 hours PRN severe pain.  potassium chloride SA 20 MEQ tablet Commonly known as:  K-DUR,KLOR-CON Take 1 tablet (20 mEq total) by mouth daily. For 5 days then stop.   REFRESH 1.4-0.6 % Soln Generic drug:  Polyvinyl Alcohol-Povidone PF Place 1 drop into both eyes 3 (three) times daily as needed (for dry eyes).   rosuvastatin 5 MG tablet Commonly known as:  CRESTOR Take 5 mg by mouth every Monday, Wednesday, and Friday.   Vitamin B12 500  MCG Tabs Take 500 mcg by mouth daily.   Vitamin D 2000 units tablet Take 2,000 Units daily by mouth.   warfarin 5 MG tablet Commonly known as:  COUMADIN Take 1 tablet (5 mg total) by mouth daily at 6 PM. Or as directed      Follow-up Information    Imogene Burn, PA-C Follow up on 05/11/2018.   Specialty:  Cardiology Why:  Appointment is at 11:30 Contact information: [address] [place] [address] [phone]        [facility] [place] Follow up on 05/13/2018.   Specialty:  Cardiothoracic Surgery Why:  Appointment is at 1:00, please get CXR at 12:30 at [place] located on first floor of our office building Contact information: [place], [employer] [phone]       [facility] Follow up on 05/07/2018.   Specialty:  Cardiothoracic Surgery Why:  Appointment is at 10:00 with nurse for suture removal Contact information: [address] [place], [place] Follow up on 05/04/2018.   Why:  Please arrive at 1:30 for appointment with PA who will monitor coumadin and establish care. INR goal 2.5 for 3 months Contact information: [place]          Signed: Nani Skillern PA-C 05/02/2018, 8:05 AM

## 2018-04-29 NOTE — Progress Notes (Signed)
Patient sent with SWOT for transfer to 4E.

## 2018-04-29 NOTE — Progress Notes (Signed)
D/C'd Right IJ Cordis per MD order per [facility] policy patient put in trendelenburg during pull and patient instructed to remain flat and on bedrest for 30 minutes. Patient voiced understanding and compliance. Patient resting comfortably. Wife at bedside.

## 2018-04-29 NOTE — Progress Notes (Signed)
CARDIAC REHAB PHASE I   PRE:  Rate/Rhythm: 70 paced  BP:  Sitting: 97/72      SaO2: 97 RA  MODE:  Ambulation: 200 ft   POST:  Rate/Rhythm: 76 SR  BP:  Sitting: 125/86    SaO2: 98 RA   Pt ambulated 260ft in hallway assist of one with front wheel walker and gait belt. Pt denied dizziness or SOB. Pt states it was harder to walk with walker than [place]. Pt returned to bed, call bell and phone within reach. Lunch tray set up. Encouraged a third walk today. Will continue to follow.  [phone] Rufina Falco, RN BSN 04/29/2018 1:41 PM

## 2018-04-30 ENCOUNTER — Inpatient Hospital Stay (HOSPITAL_COMMUNITY): Payer: Medicare Other

## 2018-04-30 LAB — GLUCOSE, CAPILLARY
Glucose-Capillary: 95 mg/dL (ref 70–99)
Glucose-Capillary: 100 mg/dL — ABNORMAL HIGH (ref 70–99)
Glucose-Capillary: 106 mg/dL — ABNORMAL HIGH (ref 70–99)
Glucose-Capillary: 120 mg/dL — ABNORMAL HIGH (ref 70–99)

## 2018-04-30 LAB — BASIC METABOLIC PANEL
Anion gap: 5 (ref 5–15)
BUN: 16 mg/dL (ref 8–23)
CHLORIDE: 103 mmol/L (ref 98–111)
CO2: 24 mmol/L (ref 22–32)
CREATININE: 0.94 mg/dL (ref 0.61–1.24)
Calcium: 7.9 mg/dL — ABNORMAL LOW (ref 8.9–10.3)
GFR calc Af Amer: >60 mL/min (ref >60)
GFR calc non Af Amer: >60 mL/min (ref >60)
Glucose, Bld: 104 mg/dL — ABNORMAL HIGH (ref 70–99)
Potassium: 4.2 mmol/L (ref 3.5–5.1)
SODIUM: 132 mmol/L — AB (ref 135–145)

## 2018-04-30 LAB — CBC
HCT: 36.8 % — ABNORMAL LOW (ref 39.0–52.0)
HEMOGLOBIN: 11.6 g/dL — AB (ref 13.0–17.0)
MCH: 28.9 pg (ref 26.0–34.0)
MCHC: 31.5 g/dL (ref 30.0–36.0)
MCV: 91.8 fL (ref 80.0–100.0)
PLATELETS: 104 10*3/uL — AB (ref 150–400)
RBC: 4.01 MIL/uL — ABNORMAL LOW (ref 4.22–5.81)
RDW: 13 % (ref 11.5–15.5)
WBC: 10.7 10*3/uL — AB (ref 4.0–10.5)
nRBC: 0 % (ref 0.0–0.2)

## 2018-04-30 LAB — PROTIME-INR
INR: 1.21
PROTHROMBIN TIME: 15.2 s (ref 11.4–15.2)

## 2018-04-30 MED ORDER — FUROSEMIDE 40 MG PO TABS
40.0000 mg | ORAL_TABLET | Freq: Every day | ORAL | Status: DC
  Administered 2018-04-30 – 2018-05-02 (×3): 40 mg via ORAL
  Filled 2018-04-30 (×3): qty 1

## 2018-04-30 MED ORDER — WARFARIN SODIUM 5 MG PO TABS
5.0000 mg | ORAL_TABLET | Freq: Every day | ORAL | Status: DC
  Administered 2018-04-30 – 2018-05-01 (×2): 5 mg via ORAL
  Filled 2018-04-30 (×2): qty 1

## 2018-04-30 MED ORDER — LOSARTAN POTASSIUM 25 MG PO TABS
25.0000 mg | ORAL_TABLET | Freq: Every day | ORAL | Status: DC
  Administered 2018-04-30 – 2018-05-02 (×3): 25 mg via ORAL
  Filled 2018-04-30 (×3): qty 1

## 2018-04-30 MED ORDER — CARVEDILOL 6.25 MG PO TABS
6.2500 mg | ORAL_TABLET | Freq: Two times a day (BID) | ORAL | Status: DC
  Administered 2018-04-30 – 2018-05-02 (×4): 6.25 mg via ORAL
  Filled 2018-04-30 (×4): qty 1

## 2018-04-30 NOTE — Progress Notes (Signed)
CARDIAC REHAB PHASE I   PRE:  Rate/Rhythm: 86 SR  BP:  Sitting: 109/76        SaO2: 93 RA  MODE:  Ambulation: 470 ft   POST:  Rate/Rhythm: 88 SR  BP:          1330 - 1355  Pt ambulated 470 ft with walker and assistance x1. Tolerated well. Gait was slow and steady. Pt reported calf muscle fatigue during walk. Ed not complete, pt wants wife present. Pt in bathroom upon return to room. Bathroom call light within reach. Will complete ed tomorrow.   Philis Kendall, MS 04/30/2018 1:48 PM

## 2018-04-30 NOTE — Progress Notes (Signed)
Patient ambulated in the hallway with RN using RW about 1000 ft tolerated very well.

## 2018-04-30 NOTE — Progress Notes (Signed)
Pacing wires removed per protocol.  Pt tolerated procedure well, vital sign q15 minx 4 obtain, bedrest until 1132.

## 2018-04-30 NOTE — Progress Notes (Signed)
On-Q pain ball removed.  Pt tolerated procedure well.

## 2018-04-30 NOTE — Care Management Important Message (Signed)
Important Message  Patient Details  Name: Chase Smith. MRN: [id number] Date of Birth: [birth year]   Medicare Important Message Given:  Yes    Barb Merino [name] 04/30/2018, 3:50 PM

## 2018-04-30 NOTE — Psychosocial Assessment (Addendum)
Two chest tubes removed per MD orders without difficulty. Dressing applied Will continue to monitor. Rishika Mccollom, Bettina Gavia RN

## 2018-04-30 NOTE — Progress Notes (Addendum)
[address]       [place] [address]             [phone]      3 Days Post-Op Procedure(s) (LRB): MINIMALLY INVASIVE MITRAL VALVE REPAIR (MVR) (Right) MINIMALLY INVASIVE MAZE PROCEDURE (N/A) TRANSESOPHAGEAL ECHOCARDIOGRAM (TEE) (N/A) CLIPPING OF ATRIAL APPENDAGE   Subjective:  No new complaints.  States he is hungry this morning.  + ambulation  + BM, small per patient mostly water  Objective: Vital signs in last 24 hours: Temp:  [98 F (36.7 C)-98.8 F (37.1 C)] 98.1 F (36.7 C) (11/01 0455) Pulse Rate:  [69-71] 71 (10/31 1952) Cardiac Rhythm: Atrial paced (10/31 2000) Resp:  [11-24] 20 (11/01 0455) BP: (99-124)/(67-88) 124/88 (10/31 1952) SpO2:  [93 %-98 %] 95 % (11/01 0455) Weight:  [98.2 kg] 98.2 kg (11/01 0502)  Intake/Output from previous day: 10/31 0701 - 11/01 0700 In: 296.3 [I.V.:62.2; IV Piggyback:234.1] Out: 2060 [Urine:1760; Chest Tube:300]  General appearance: alert, cooperative and no distress Heart: regular rate and rhythm Lungs: clear to auscultation bilaterally Abdomen: soft, non-tender; bowel sounds normal; no masses,  no organomegaly Extremities: edema trace Wound: clean and dry, bruising along both thoracotomy and groin cannulation site  Lab Results: Recent Labs    04/29/18 0354 04/30/18 0332  WBC 10.5 10.7*  HGB 10.1* 11.6*  HCT 31.9* 36.8*  PLT 84* 104*   BMET:  Recent Labs    04/29/18 0354 04/30/18 0332  NA 137 132*  K 3.3* 4.2  CL 113* 103  CO2 21* 24  GLUCOSE 107* 104*  BUN 14 16  CREATININE 0.75 0.94  CALCIUM 6.5* 7.9*    PT/INR:  Recent Labs    04/30/18 0332  LABPROT 15.2  INR 1.21   ABG    Component Value Date/Time   PHART 7.339 (L) 04/27/2018 2132   HCO3 18.2 (L) 04/27/2018 2132   TCO2 22 04/28/2018 1817   ACIDBASEDEF 7.0 (H) 04/27/2018 2132   O2SAT 94.0 04/27/2018 2132   CBG (last 3)  Recent Labs    04/29/18 1713 04/29/18 2243 04/30/18 0649  GLUCAP 111* 106* 95     Assessment/Plan: S/P Procedure(s) (LRB): MINIMALLY INVASIVE MITRAL VALVE REPAIR (MVR) (Right) MINIMALLY INVASIVE MAZE PROCEDURE (N/A) TRANSESOPHAGEAL ECHOCARDIOGRAM (TEE) (N/A) CLIPPING OF ATRIAL APPENDAGE  1. CV- Sinus Bradycardia- no BB at this time, will d/c EPW today 2. INR 1.21, has received 2 doses of 2.5 mg daily, will increase dose to 5 mg daily 3. Pulm- no acute issues, CT output around 300 yesterday, will discuss chest tube removal with Dr. Roxy Manns 4. Renal- creatinine WNL, weight is relatively stable, will transition to lasix 40 mg daily, K is WNL 5. CBGs controlled, not a diabetic, will d/c SSIP 6. Dispo- patient doing well, remains in Sinus Bradycardia, no BB at this time, will d/c EPW today, increase coumadin dose, continue diuretics, will discuss chest tube removal with Dr. Roxy Manns, likely d/c over the weekend   LOS: 3 days    Erin Barrett 04/30/2018  I have seen and examined the patient and agree with the assessment and plan as outlined.  Restart carvedilol at reduced dose.  Restart Cozaar.  D/C tubes and wires.  Possible D/C home tomorrow or Sunday.  Will need to make arrangements for outpatient cardiac rehab at the [facility] in [place], [place] and Coumadin management at medical clinic in [place], [place] - goal INR 2.5 for total 3 months duration.  Rexene Alberts, MD 04/30/2018 9:58  AM    

## 2018-05-01 ENCOUNTER — Inpatient Hospital Stay (HOSPITAL_COMMUNITY): Payer: Medicare Other

## 2018-05-01 LAB — CBC
HCT: 36.7 % — ABNORMAL LOW (ref 39.0–52.0)
HEMOGLOBIN: 12.2 g/dL — AB (ref 13.0–17.0)
MCH: 30.3 pg (ref 26.0–34.0)
MCHC: 33.2 g/dL (ref 30.0–36.0)
MCV: 91.1 fL (ref 80.0–100.0)
Platelets: 127 10*3/uL — ABNORMAL LOW (ref 150–400)
RBC: 4.03 MIL/uL — AB (ref 4.22–5.81)
RDW: 12.9 % (ref 11.5–15.5)
WBC: 10.6 10*3/uL — ABNORMAL HIGH (ref 4.0–10.5)
nRBC: 0 % (ref 0.0–0.2)

## 2018-05-01 LAB — BASIC METABOLIC PANEL
ANION GAP: 6 (ref 5–15)
BUN: 19 mg/dL (ref 8–23)
CO2: 26 mmol/L (ref 22–32)
Calcium: 8 mg/dL — ABNORMAL LOW (ref 8.9–10.3)
Chloride: 101 mmol/L (ref 98–111)
Creatinine, Ser: 1 mg/dL (ref 0.61–1.24)
GFR calc Af Amer: >60 mL/min (ref >60)
GLUCOSE: 100 mg/dL — AB (ref 70–99)
POTASSIUM: 3.8 mmol/L (ref 3.5–5.1)
Sodium: 133 mmol/L — ABNORMAL LOW (ref 135–145)

## 2018-05-01 LAB — GLUCOSE, CAPILLARY
GLUCOSE-CAPILLARY: 89 mg/dL (ref 70–99)
GLUCOSE-CAPILLARY: 103 mg/dL — AB (ref 70–99)
Glucose-Capillary: 115 mg/dL — ABNORMAL HIGH (ref 70–99)

## 2018-05-01 LAB — PROTIME-INR
INR: 1.19
Prothrombin Time: 15 seconds (ref 11.4–15.2)

## 2018-05-01 MED ORDER — POTASSIUM CHLORIDE CRYS ER 20 MEQ PO TBCR
20.0000 meq | EXTENDED_RELEASE_TABLET | Freq: Once | ORAL | Status: AC
  Administered 2018-05-01: 20 meq via ORAL
  Filled 2018-05-01: qty 1

## 2018-05-01 MED ORDER — GUAIFENESIN ER 600 MG PO TB12
600.0000 mg | ORAL_TABLET | Freq: Two times a day (BID) | ORAL | Status: DC
  Administered 2018-05-01 – 2018-05-02 (×3): 600 mg via ORAL
  Filled 2018-05-01 (×3): qty 1

## 2018-05-01 NOTE — Progress Notes (Signed)
Cardiac Rehab [id number]  Pt is walking independently denies any problems. Completed discharge education with pt. We discussed activity restrictions, exercise guidelines,,heart healthy diet,use of IS and Outpt. CRP. He voices understanding. Will send referral to [facility]. CRP in [place] which is closest to his home.

## 2018-05-01 NOTE — Progress Notes (Addendum)
      [address]       [place] [address]             [phone]        4 Days Post-Op Procedure(s) (LRB): MINIMALLY INVASIVE MITRAL VALVE REPAIR (MVR) (Right) MINIMALLY INVASIVE MAZE PROCEDURE (N/A) TRANSESOPHAGEAL ECHOCARDIOGRAM (TEE) (N/A) CLIPPING OF ATRIAL APPENDAGE  Subjective: Patient has cough, at times productive (clear)  Objective: Vital signs in last 24 hours: Temp:  [98.3 F (36.8 C)-99 F (37.2 C)] 98.7 F (37.1 C) (11/02 0435) Pulse Rate:  [80-86] 86 (11/02 0828) Cardiac Rhythm: Normal sinus rhythm;Heart block (11/02 0828) Resp:  [18-26] 18 (11/02 0828) BP: (110-129)/(70-93) 129/91 (11/02 0828) SpO2:  [92 %-95 %] 95 % (11/02 0828) Weight:  [95.3 kg] 95.3 kg (11/02 0555)  Pre op weight 96.3 kg Current Weight  05/01/18 95.3 kg       Intake/Output from previous day: 11/01 0701 - 11/02 0700 In: 720 [P.O.:720] Out: 1000 [Urine:1000]   Physical Exam:  Cardiovascular: RRR, no murmur. Pulmonary: Clear to auscultation bilaterally Abdomen: Soft, non tender, bowel sounds present. Extremities: No lower extremity edema. Wounds: Clean and dry.  No erythema or signs of infection. Ecchymosis right anterior chest wound.  Lab Results: CBC: Recent Labs    04/30/18 0332 05/01/18 0423  WBC 10.7* 10.6*  HGB 11.6* 12.2*  HCT 36.8* 36.7*  PLT 104* 127*   BMET:  Recent Labs    04/30/18 0332 05/01/18 0423  NA 132* 133*  K 4.2 3.8  CL 103 101  CO2 24 26  GLUCOSE 104* 100*  BUN 16 19  CREATININE 0.94 1.00  CALCIUM 7.9* 8.0*    PT/INR:  Lab Results  Component Value Date   INR 1.19 05/01/2018   INR 1.21 04/30/2018   INR 1.25 04/29/2018   ABG:  INR: Will add last result for INR, ABG once components are confirmed Will add last 4 CBG results once components are confirmed  Assessment/Plan:  1. CV - Previous bradycardia but since resolved. Had a brief run of NSVT )4 beats) earlier today. First degree heart block, SR in the  80's at time of exam. On Carvedilol 6.25 mg bid, Losartan 25 mg daily, and Coumadin 5 mg daily. INR this am slightly decreased from 1.21 to 1.19. Will continue with 5 mg as will see first result in am. 2.  Pulmonary - On room air. Will give Mucinex for cough. Encourage incentive spirometer 3.  Acute blood loss anemia - H and H 12.2 and 36.7 4. Thrombocytopenia-platelets this am 127.000 5. Supplement potassium 6. As discussed with Dr. Cyndia Bent, will await am's INR;if increased, will discharge in am   Donielle M ZimmermanPA-C 05/01/2018,9:14 AM [phone]   Chart reviewed, patient examined, agree with above. At this point we are not sure what dose of Coumadin he should go home on.

## 2018-05-02 LAB — PROTIME-INR
INR: 1.29
PROTHROMBIN TIME: 15.9 s — AB (ref 11.4–15.2)

## 2018-05-02 LAB — GLUCOSE, CAPILLARY
GLUCOSE-CAPILLARY: 120 mg/dL — AB (ref 70–99)
Glucose-Capillary: 103 mg/dL — ABNORMAL HIGH (ref 70–99)
Glucose-Capillary: 88 mg/dL (ref 70–99)

## 2018-05-02 MED ORDER — POTASSIUM CHLORIDE CRYS ER 20 MEQ PO TBCR: 20.0000 meq | EXTENDED_RELEASE_TABLET | Freq: Every day | ORAL | 0 refills | Status: DC

## 2018-05-02 MED ORDER — WARFARIN SODIUM 5 MG PO TABS: 5.0000 mg | ORAL_TABLET | Freq: Every day | ORAL | 1 refills | Status: DC

## 2018-05-02 MED ORDER — FUROSEMIDE 40 MG PO TABS: 40.0000 mg | ORAL_TABLET | Freq: Every day | ORAL | 0 refills | Status: DC

## 2018-05-02 MED ORDER — ASPIRIN 81 MG PO TBEC: 81.0000 mg | DELAYED_RELEASE_TABLET | Freq: Every day | ORAL | Status: DC

## 2018-05-02 MED ORDER — GUAIFENESIN ER 600 MG PO TB12: 600.0000 mg | ORAL_TABLET | Freq: Two times a day (BID) | ORAL | Status: DC | PRN

## 2018-05-02 MED ORDER — POTASSIUM CHLORIDE CRYS ER 20 MEQ PO TBCR
20.0000 meq | EXTENDED_RELEASE_TABLET | Freq: Every day | ORAL | Status: DC
  Administered 2018-05-02: 20 meq via ORAL
  Filled 2018-05-02: qty 1

## 2018-05-02 MED ORDER — OXYCODONE HCL 5 MG PO TABS: ORAL_TABLET | ORAL | 0 refills | Status: DC

## 2018-05-02 NOTE — Progress Notes (Signed)
Pt discharged.  AVS printed and reviewed with pt and wife including medications and appointment all questions answered.  Peripheral IV removed, vital signs obtained and were stable.

## 2018-05-02 NOTE — Progress Notes (Signed)
      [address]       [place] [address]             [phone]        5 Days Post-Op Procedure(s) (LRB): MINIMALLY INVASIVE MITRAL VALVE REPAIR (MVR) (Right) MINIMALLY INVASIVE MAZE PROCEDURE (N/A) TRANSESOPHAGEAL ECHOCARDIOGRAM (TEE) (N/A) CLIPPING OF ATRIAL APPENDAGE  Subjective: Patient without complaints this am. He hopes to go home.  Objective: Vital signs in last 24 hours: Temp:  [98.1 F (36.7 C)-99.3 F (37.4 C)] 98.1 F (36.7 C) (11/03 0546) Pulse Rate:  [85-86] 85 (11/03 0546) Cardiac Rhythm: Normal sinus rhythm;Heart block (11/02 1900) Resp:  [17-27] 21 (11/03 0546) BP: (108-129)/(79-91) 127/80 (11/03 0546) SpO2:  [94 %-95 %] 94 % (11/03 0546) Weight:  [94.9 kg] 94.9 kg (11/03 0318)  Pre op weight 96.3 kg Current Weight  05/02/18 94.9 kg       Intake/Output from previous day: 11/02 0701 - 11/03 0700 In: 240 [P.O.:240] Out: 1 [Stool:1]   Physical Exam:  Cardiovascular: RRR, no murmur. Pulmonary: Clear to auscultation bilaterally Abdomen: Soft, non tender, bowel sounds present. Extremities: No lower extremity edema. Wounds: Clean and dry.  No erythema or signs of infection. Ecchymosis right anterior chest wound.  Lab Results: CBC: Recent Labs    04/30/18 0332 05/01/18 0423  WBC 10.7* 10.6*  HGB 11.6* 12.2*  HCT 36.8* 36.7*  PLT 104* 127*   BMET:  Recent Labs    04/30/18 0332 05/01/18 0423  NA 132* 133*  K 4.2 3.8  CL 103 101  CO2 24 26  GLUCOSE 104* 100*  BUN 16 19  CREATININE 0.94 1.00  CALCIUM 7.9* 8.0*    PT/INR:  Lab Results  Component Value Date   INR 1.29 05/02/2018   INR 1.19 05/01/2018   INR 1.21 04/30/2018   ABG:  INR: Will add last result for INR, ABG once components are confirmed Will add last 4 CBG results once components are confirmed  Assessment/Plan:  1. CV -   First degree heart block, SR in the 80's at time of exam. On Carvedilol 6.25 mg bid, Losartan 25 mg daily, and Coumadin 5  mg daily. INR this am slightly increased from 1.19 to 1.29. Will continue with 5 mg at discharge. 2.  Pulmonary - On room air. Will give Mucinex for cough. Encourage incentive spirometer 3.  Acute blood loss anemia - H and H 12.2 and 36.7 4. Thrombocytopenia-platelets this am 127.000 5. Discharge  Nateisha Moyd M ZimmermanPA-C 05/02/2018,7:17 AM [phone]

## 2018-05-04 ENCOUNTER — Encounter (HOSPITAL_COMMUNITY): Payer: Self-pay | Admitting: Thoracic Surgery (Cardiothoracic Vascular Surgery)

## 2018-05-04 DIAGNOSIS — I480 Paroxysmal atrial fibrillation: Secondary | ICD-10-CM | POA: Diagnosis not present

## 2018-05-04 DIAGNOSIS — Z9889 Other specified postprocedural states: Secondary | ICD-10-CM | POA: Diagnosis not present

## 2018-05-05 ENCOUNTER — Telehealth: Payer: Self-pay

## 2018-05-05 NOTE — Telephone Encounter (Signed)
Chasity with [facility] contacted the office requesting the goal/ range of patient's INR after valve surgery.  Advised her to contact patient's Cardiologist to get the range.  She acknowledged receipt.

## 2018-05-07 ENCOUNTER — Ambulatory Visit (INDEPENDENT_AMBULATORY_CARE_PROVIDER_SITE_OTHER): Payer: Self-pay

## 2018-05-07 ENCOUNTER — Other Ambulatory Visit: Payer: Self-pay

## 2018-05-07 ENCOUNTER — Ambulatory Visit (INDEPENDENT_AMBULATORY_CARE_PROVIDER_SITE_OTHER): Payer: Medicare Other | Admitting: *Deleted

## 2018-05-07 VITALS — Ht 72.0 in

## 2018-05-07 DIAGNOSIS — Z5181 Encounter for therapeutic drug level monitoring: Secondary | ICD-10-CM | POA: Diagnosis not present

## 2018-05-07 DIAGNOSIS — Z8679 Personal history of other diseases of the circulatory system: Secondary | ICD-10-CM | POA: Diagnosis not present

## 2018-05-07 DIAGNOSIS — Z9889 Other specified postprocedural states: Secondary | ICD-10-CM

## 2018-05-07 DIAGNOSIS — Z4802 Encounter for removal of sutures: Secondary | ICD-10-CM

## 2018-05-07 LAB — POCT INR: INR: 2.7 (ref 2.0–3.0)

## 2018-05-07 NOTE — Progress Notes (Signed)
Patient arrived for nurse visit to remove 2 sutures post- procedure Mini MVR/ Maze 04/27/18 with Dr. Roxy Manns.  Sutures removed with no signs/ symptoms of infection noted.  Some mild redness at the incision sites.  Reviewed with patient signs/ symptoms of infection and when to contact the office.  Patient tolerated procedure well.  Patient instructed to keep the incision sites clean and dry.  Patient is aware of his follow-up appointment.  Patient acknowledged instructions given.

## 2018-05-07 NOTE — Patient Instructions (Addendum)
Description   Pt is being followed by [facility].      Please keep your appt with [facility] on Monday 05/10/18. Pt states they told him to take 5.5mg  & recheck INR on 05/10/18 & he has an appt.

## 2018-05-10 ENCOUNTER — Other Ambulatory Visit: Payer: Self-pay | Admitting: Thoracic Surgery (Cardiothoracic Vascular Surgery)

## 2018-05-10 DIAGNOSIS — Z9889 Other specified postprocedural states: Secondary | ICD-10-CM

## 2018-05-11 ENCOUNTER — Other Ambulatory Visit: Payer: Self-pay

## 2018-05-11 ENCOUNTER — Ambulatory Visit (INDEPENDENT_AMBULATORY_CARE_PROVIDER_SITE_OTHER): Payer: Medicare Other | Admitting: Physician Assistant

## 2018-05-11 ENCOUNTER — Encounter: Payer: Self-pay | Admitting: Physician Assistant

## 2018-05-11 ENCOUNTER — Ambulatory Visit (INDEPENDENT_AMBULATORY_CARE_PROVIDER_SITE_OTHER): Payer: Self-pay | Admitting: Physician Assistant

## 2018-05-11 ENCOUNTER — Ambulatory Visit
Admission: RE | Admit: 2018-05-11 | Discharge: 2018-05-11 | Disposition: A | Discharge status: Home / Self Care | Payer: Medicare Other | Source: Ambulatory Visit | Attending: Thoracic Surgery (Cardiothoracic Vascular Surgery) | Admitting: Thoracic Surgery (Cardiothoracic Vascular Surgery)

## 2018-05-11 VITALS — BP 108/82 | HR 86 | Ht 72.0 in | Wt 207.8 lb

## 2018-05-11 VITALS — BP 112/78 | HR 90 | Resp 18 | Ht 72.0 in | Wt 207.0 lb

## 2018-05-11 DIAGNOSIS — I480 Paroxysmal atrial fibrillation: Secondary | ICD-10-CM | POA: Diagnosis not present

## 2018-05-11 DIAGNOSIS — J90 Pleural effusion, not elsewhere classified: Secondary | ICD-10-CM | POA: Diagnosis not present

## 2018-05-11 DIAGNOSIS — Z9889 Other specified postprocedural states: Secondary | ICD-10-CM

## 2018-05-11 DIAGNOSIS — I519 Heart disease, unspecified: Secondary | ICD-10-CM | POA: Diagnosis not present

## 2018-05-11 DIAGNOSIS — I10 Essential (primary) hypertension: Secondary | ICD-10-CM | POA: Diagnosis not present

## 2018-05-11 DIAGNOSIS — J810 Acute pulmonary edema: Secondary | ICD-10-CM | POA: Diagnosis not present

## 2018-05-11 DIAGNOSIS — A491 Streptococcal infection, unspecified site: Secondary | ICD-10-CM

## 2018-05-11 DIAGNOSIS — I5189 Other ill-defined heart diseases: Secondary | ICD-10-CM

## 2018-05-11 NOTE — Progress Notes (Signed)
Cardiology Office Note    Date:  05/11/2018   ID:  Chase Martin., DOB [birth year], MRN [id number]  PCP:  Josetta Huddle, MD  Cardiologist: Fransico Him, MD EPS: None  No chief complaint on file.   History of Present Illness:  Chase Smith. is a 72 y.o. male with history of MVP PAF and hypertension who developed Streptococcus viridans bacteremia 12/2017 TEE demonstrated MVP with severe MR, no vegetation identified.  He was treated for presumed bacterial endocarditis for 6 weeks.  Cardiac catheterization showed normal coronary arteries and severe MR.  He underwent minimally invasive mitral valve repair and Maze procedure and clipping of the LA appendage 04/27/2018.  Patient here for post hospital f/u. Has been sore but overall doing well.  His heart rate has been around 85 at home which is higher for him.  He was used to heart rate in the 60s.  Does not think he has had any atrial fibrillation or any other problems.  Going to do cardiac rehab in [facility].  Has follow-up with Dr. Roxy Manns today.  Past Medical History:  Diagnosis Date  . Abdominal mass, RLQ (right lower quadrant) 07/27/2012  . Benign essential HTN 02/07/2015  . BPH (benign prostatic hyperplasia)    mild  . Dilated aortic root (HCC)    34 mm by echo 12/2017  . ED (erectile dysfunction) 08/2011   cancelled f/u with Urology  . Epididymitis    recurrent  . Hypertension    in the past - resolved  . Left ventricular diastolic dysfunction, NYHA class 2   . Meckel's diverticulitis 08/27/2012  . Mitral valve regurgitation   . Moderate mitral regurgitation 08/08/2013  . MVP (mitral valve prolapse)    Bileaflet MVP with mild to moderate MR by echo 10/2017  . PAF (paroxysmal atrial fibrillation) ([place]) 05/13/2017   CHADS2VASC score is 2 (HTN and age > 27) on Apixaban  . S/P minimally invasive maze operation for atrial fibrillation 04/27/2018   Complete bilateral atrial lesion set using cryothermy and bipolar  radiofrequency ablation with clipping of LA appendage via right mini thoracotomy approach  . S/P minimally invasive mitral valve repair 04/27/2018   Complex valvuloplasty including triangular resection of P2 segment of posterior leaflet, artificial Gore-tex neochord placement x8 and Sorin Memo 4D ring annulopasty via right mini thoracotomy approach  . Vertigo    episodic mild positional    Past Surgical History:  Procedure Laterality Date  . APPENDECTOMY    . CARDIAC CATHETERIZATION  03/24/2018  . CLIPPING OF ATRIAL APPENDAGE  04/27/2018   Procedure: CLIPPING OF ATRIAL APPENDAGE;  Surgeon: Rexene Alberts, MD;  Location: [place];  Service: Open Heart Surgery;;  . LAPAROSCOPIC APPENDECTOMY N/A 08/11/2012   Procedure: APPENDECTOMY LAPAROSCOPIC;  Surgeon: Stark Klein, MD;  Location: [facility];  Service: General;  Laterality: N/A;  . LAPAROSCOPIC SMALL BOWEL RESECTION N/A 08/11/2012   Procedure: Diagnostic Laparoscopy,  Diverticulectomy and laparoscopic appendectomy;  Surgeon: Stark Klein, MD;  Location: [facility];  Service: General;  Laterality: N/A;  Diagnostic Laparoscopy, Small Bowel Resection vs Diverticulectomy   . MINIMALLY INVASIVE MAZE PROCEDURE N/A 04/27/2018   Procedure: MINIMALLY INVASIVE MAZE PROCEDURE;  Surgeon: Rexene Alberts, MD;  Location: [place];  Service: Open Heart Surgery;  Laterality: N/A;  . MITRAL VALVE REPAIR Right 04/27/2018   Procedure: MINIMALLY INVASIVE MITRAL VALVE REPAIR (MVR);  Surgeon: Rexene Alberts, MD;  Location: [place];  Service: Open Heart Surgery;  Laterality: Right;  .  PILONIDAL CYST EXCISION    . RECONSTRUCTION OF NOSE     secondary to deviated septum  . RIGHT/LEFT HEART CATH AND CORONARY ANGIOGRAPHY N/A 03/24/2018   Procedure: RIGHT/LEFT HEART CATH AND CORONARY ANGIOGRAPHY;  Surgeon: Jettie Booze, MD;  Location: [place] [facility];  Service: Cardiovascular;  Laterality: N/A;  . TEE WITHOUT CARDIOVERSION N/A 08/08/2013   Procedure: TRANSESOPHAGEAL  ECHOCARDIOGRAM (TEE);  Surgeon: Sueanne Margarita, MD;  Location: [facility];  Service: Cardiovascular;  Laterality: N/A;  . TEE WITHOUT CARDIOVERSION N/A 01/05/2018   Procedure: TRANSESOPHAGEAL ECHOCARDIOGRAM (TEE);  Surgeon: Lelon Perla, MD;  Location: [facility] ENDOSCOPY;  Service: Cardiovascular;  Laterality: N/A;  . TEE WITHOUT CARDIOVERSION N/A 04/27/2018   Procedure: TRANSESOPHAGEAL ECHOCARDIOGRAM (TEE);  Surgeon: Rexene Alberts, MD;  Location: [place];  Service: Open Heart Surgery;  Laterality: N/A;  . TONSILLECTOMY      Current Medications: Current Meds  Medication Sig  . acetaminophen (TYLENOL) 500 MG tablet Take 500-1,000 mg by mouth every 6 (six) hours as needed for moderate pain or headache.  Marland Kitchen aspirin EC 81 MG EC tablet Take 1 tablet (81 mg total) by mouth daily.  . carvedilol (COREG) 6.25 MG tablet Take 1 tablet (6.25 mg total) by mouth 2 (two) times daily with a meal.  . Cholecalciferol (VITAMIN D) 2000 UNITS tablet Take 2,000 Units daily by mouth.   . Coenzyme Q10 (CO Q-10 PO) Take 1 capsule by mouth daily.  . Cyanocobalamin (VITAMIN B12) 500 MCG TABS Take 500 mcg by mouth daily.   . flunisolide (NASALIDE) 25 MCG/ACT (0.025%) SOLN Place 2 sprays into the nose daily as needed (for allergies).   Marland Kitchen guaiFENesin (MUCINEX) 600 MG 12 hr tablet Take 1 tablet (600 mg total) by mouth 2 (two) times daily as needed for cough or to loosen phlegm.  Marland Kitchen losartan (COZAAR) 50 MG tablet Take 25 mg by mouth daily.   . Polyvinyl Alcohol-Povidone PF (REFRESH) 1.4-0.6 % SOLN Place 1 drop into both eyes 3 (three) times daily as needed (for dry eyes).  . rosuvastatin (CRESTOR) 5 MG tablet Take 5 mg by mouth every Monday, Wednesday, and Friday.   . warfarin (COUMADIN) 5 MG tablet Take 1 tablet (5 mg total) by mouth daily at 6 PM. Or as directed (Patient taking differently: Take 5.5 mg by mouth daily at 6 PM. Or as directed)  . [DISCONTINUED] amoxicillin (AMOXIL) 500 MG tablet Take 4 tablets by mouth 30-60  minutes prior to dental procedures     Allergies:   Ace inhibitors; Sulfa antibiotics; and Antihistamines, chlorpheniramine-type   Social History   Socioeconomic History  . Marital status: Married    Spouse name: Not on file  . Number of children: Not on file  . Years of education: Not on file  . Highest education level: Not on file  Occupational History  . Not on file  Social Needs  . Financial resource strain: Not on file  . Food insecurity:    Worry: Not on file    Inability: Not on file  . Transportation needs:    Medical: Not on file    Non-medical: Not on file  Tobacco Use  . Smoking status: Never Smoker  . Smokeless tobacco: Never Used  Substance and Sexual Activity  . Alcohol use: Yes    Comment: one drink per day (wine or beer)  . Drug use: No  . Sexual activity: Yes  Lifestyle  . Physical activity:    Days per week: Not  on file    Minutes per session: Not on file  . Stress: Not on file  Relationships  . Social connections:    Talks on phone: Not on file    Gets together: Not on file    Attends religious service: Not on file    Active member of club or organization: Not on file    Attends meetings of clubs or organizations: Not on file    Relationship status: Not on file  Other Topics Concern  . Not on file  Social History Narrative  . Not on file     Family History:  The patient's family history includes Cancer in his father, maternal aunt, maternal uncle, mother, and paternal uncle; Heart failure in his father.   ROS:   Please see the history of present illness.    Review of Systems  Constitution: Positive for malaise/fatigue.  HENT: Negative.   Cardiovascular: Negative.        Chest soreness at incision sites  Respiratory: Negative.   Endocrine: Negative.   Hematologic/Lymphatic: Negative.   Musculoskeletal: Negative.   Gastrointestinal: Negative.   Genitourinary: Negative.   Neurological: Negative.    All other systems reviewed and are  negative.   PHYSICAL EXAM:   VS:  BP 108/82   Pulse 86   Ht 6' (1.829 m)   Wt 207 lb 12.8 oz (94.3 kg)   SpO2 98%   BMI 28.18 kg/m   Physical Exam  GEN: Well nourished, well developed, in no acute distress  Neck: no JVD, carotid bruits, or masses Cardiac: Incisions healing well, some swelling and tenderness around incision sites.  RRR; no murmurs, rubs, or gallops  Respiratory:  clear to auscultation bilaterally, normal work of breathing GI: soft, nontender, nondistended, + BS Ext: without cyanosis, clubbing, or edema, Good distal pulses bilaterally Neuro:  Alert and Oriented x 3 Psych: euthymic mood, full affect  Wt Readings from Last 3 Encounters:  05/11/18 207 lb 12.8 oz (94.3 kg)  05/02/18 209 lb 3.5 oz (94.9 kg)  04/23/18 212 lb 6.4 oz (96.3 kg)      Studies/Labs Reviewed:   EKG:  EKG is  ordered today.  The ekg ordered today demonstrates normal sinus rhythm with first-degree AV block QTC 484  Recent Labs: 04/23/2018: ALT 16 04/28/2018: Magnesium 2.8 05/01/2018: BUN 19; Creatinine, Ser 1.00; Hemoglobin 12.2; Platelets 127; Potassium 3.8; Sodium 133   Lipid Panel No results found for: CHOL, TRIG, HDL, CHOLHDL, VLDL, LDLCALC, LDLDIRECT  Additional studies/ records that were reviewed today include:   TEE 04/27/2018 Result status: Final result   Septum: No Patent Foramen Ovale present.  Left atrium: Patent foramen ovale not present.  Aortic valve: No stenosis. Trace regurgitation. No AV vegetation.  Mitral valve: Moderate regurgitation. Flail portion of the posterior leaflet involving the middle scallop.  Right ventricle: Normal cavity size, wall thickness and ejection fraction. No thrombus present. No mass present.  Pulmonic valve: Trace regurgitation.        ASSESSMENT:    1. S/P minimally invasive mitral valve repair + maze procedure   2. Benign essential HTN   3. PAF (paroxysmal atrial fibrillation) ([place])   4. Left ventricular diastolic  dysfunction, NYHA class 2   5. Streptococcus viridans infection      PLAN:  In order of problems listed above:  Status post minimally invasive mitral valve repair and Maze procedure with LAA clipping 04/27/2018 doing well.  He plans to start cardiac rehab at [facility]  Benign  essential hypertension blood pressure on the low side  PAF maintaining normal sinus rhythm on carvedilol 6.25 mg twice daily.  Does have first-degree AV block.  Heart rate running 85 consistently which is higher than he is used to.  Hold off on increasing carvedilol at this time.  Left ventricular diastolic dysfunction no heart failure on exam  Streptococcus viridans infection with presumed SBE 12/2017    Medication Adjustments/Labs and Tests Ordered: Current medicines are reviewed at length with the patient today.  Concerns regarding medicines are outlined above.  Medication changes, Labs and Tests ordered today are listed in the Patient Instructions below. Patient Instructions  Medication Instructions:  Your physician recommends that you continue on your current medications as directed. Please refer to the Current Medication list given to you today.  If you need a refill on your cardiac medications before your next appointment, please call your pharmacy.   Lab work: None Ordered  If you have labs (blood work) drawn today and your tests are completely normal, you will receive your results only by: Marland Kitchen MyChart Message (if you have MyChart) OR . A paper copy in the mail If you have any lab test that is abnormal or we need to change your treatment, we will call you to review the results.  Testing/Procedures: None ordered  Follow-Up: . Follow up with Ermalinda Barrios, PA on 07/13/18 at 10:30 AM  Any Other Special Instructions Will Be Listed Below (If Applicable).       Sumner Boast, PA-C  05/11/2018 11:47 AM    [facility] [place], [place], [place]   [address] Phone: [phone]; Fax: [phone]

## 2018-05-11 NOTE — Progress Notes (Signed)
HPI:  Patient returns for routine postoperative follow-up having undergone Minimally Invasive Mitral Valve Repair and MAZE procedure on 04/27/2018.  The patient's early postoperative recovery while in the hospital was unremarkable.  He progressed without difficulty and was discharged home on 05/02/2018.  He was evaluated at the Cardiology office today at which time EKG showed NSR with 1st degree AV Block.  Since hospital discharge the patient reports he is doing fairly well.  He does still have a decrease in his energy level, but does state this has improved since hospital discharge.  He notices his HR is faster than it was prior to surgery and is concerned it will stay high.  He is ambulating without difficulty.  His INR was therapeutic at 2.7 last check and he is due to have another check this week.   Current Outpatient Medications  Medication Sig Dispense Refill  . acetaminophen (TYLENOL) 500 MG tablet Take 500-1,000 mg by mouth every 6 (six) hours as needed for moderate pain or headache.    Marland Kitchen aspirin EC 81 MG EC tablet Take 1 tablet (81 mg total) by mouth daily.    . carvedilol (COREG) 6.25 MG tablet Take 1 tablet (6.25 mg total) by mouth 2 (two) times daily with a meal. 60 tablet 3  . Cholecalciferol (VITAMIN D) 2000 UNITS tablet Take 2,000 Units daily by mouth.     . Coenzyme Q10 (CO Q-10 PO) Take 1 capsule by mouth daily.    . Cyanocobalamin (VITAMIN B12) 500 MCG TABS Take 500 mcg by mouth daily.     . flunisolide (NASALIDE) 25 MCG/ACT (0.025%) SOLN Place 2 sprays into the nose daily as needed (for allergies).     Marland Kitchen guaiFENesin (MUCINEX) 600 MG 12 hr tablet Take 1 tablet (600 mg total) by mouth 2 (two) times daily as needed for cough or to loosen phlegm.    Marland Kitchen losartan (COZAAR) 50 MG tablet Take 25 mg by mouth daily.     . Polyvinyl Alcohol-Povidone PF (REFRESH) 1.4-0.6 % SOLN Place 1 drop into both eyes 3 (three) times daily as needed (for dry eyes).    . rosuvastatin (CRESTOR) 5 MG tablet  Take 5 mg by mouth every Monday, Wednesday, and Friday.   3  . warfarin (COUMADIN) 5 MG tablet Take 1 tablet (5 mg total) by mouth daily at 6 PM. Or as directed (Patient taking differently: Take 5.5 mg by mouth daily at 6 PM. Or as directed) 30 tablet 1   No current facility-administered medications for this visit.     Physical Exam:  BP 112/78 (BP Location: Left Arm, Patient Position: Sitting, Cuff Size: Normal)   Pulse 90   Resp 18   Ht 6' (1.829 m)   Wt 207 lb (93.9 kg)   SpO2 98% Comment: RA  BMI 28.07 kg/m   Gen: no apparent distress Heart: RRR Lungs: CTA bilaterally Abd: soft non-tender, non-distended Ext: no edema Incisions: well healed, residual ecchymosis present  Diagnostic Tests:  CXR: looks good, there is no evidence of pleural effusions, no pneumothorax  A/P:  1. S/P Minimally Invasive Mitral Valve Repair- doing well, continue coumadin, most recent INR was 2.7  2. H/O Atrial Fibrillation S/P MAZE procedure.. Per patient no further Atrial Fibrillation that he is aware of.  EKG obtained at Cardiology office today was reviewed and showed NSR with 1st degree AV block-- continue Coreg at current dose... In regards to HR, it is in the 80s which I explained to the patient this is  a completely normal heart rate.  I explained his heart is still recovering and he may go back to his normal range in the 60s, however should he remain in the 80s, this is completely normal. 3. Activity- increase as tolerated, will start cardiac rehab in [place].  Okay to drive since off Narcotics, Endocarditis instructions provided, RTC in 3 months with CHO or sooner if needed  Ellwood Handler, PA-C [facility] [phone]

## 2018-05-11 NOTE — Patient Instructions (Addendum)
Medication Instructions:  Your physician recommends that you continue on your current medications as directed. Please refer to the Current Medication list given to you today.  If you need a refill on your cardiac medications before your next appointment, please call your pharmacy.   Lab work: None Ordered  If you have labs (blood work) drawn today and your tests are completely normal, you will receive your results only by: Marland Kitchen MyChart Message (if you have MyChart) OR . A paper copy in the mail If you have any lab test that is abnormal or we need to change your treatment, we will call you to review the results.  Testing/Procedures: None ordered  Follow-Up: . Follow up with Ermalinda Barrios, PA on 07/13/18 at 10:30 AM  Any Other Special Instructions Will Be Listed Below (If Applicable).

## 2018-05-11 NOTE — Patient Instructions (Signed)
You may return to driving an automobile as long as you are no longer requiring oral narcotic pain relievers during the daytime.  It would be wise to start driving only short distances during the daylight and gradually increase from there as you feel comfortable.  Endocarditis is a potentially serious infection of heart valves or inside lining of the heart.  It occurs more commonly in patients with diseased heart valves (such as patient's with aortic or mitral valve disease) and in patients who have undergone heart valve repair or replacement.  Certain surgical and dental procedures may put you at risk, such as dental cleaning, other dental procedures, or any surgery involving the respiratory, urinary, gastrointestinal tract, gallbladder or prostate gland.   To minimize your chances for develooping endocarditis, maintain good oral health and seek prompt medical attention for any infections involving the mouth, teeth, gums, skin or urinary tract.    Always notify your doctor or dentist about your underlying heart valve condition before having any invasive procedures. You will need to take antibiotics before certain procedures, including all routine dental cleanings or other dental procedures.  Your cardiologist or dentist should prescribe these antibiotics for you to be taken ahead of time.  Make every effort to maintain a "heart-healthy" lifestyle with regular physical exercise and adherence to a low-fat, low-carbohydrate diet.  Continue to seek regular follow-up appointments with your primary care physician and/or cardiologist.   You may continue to gradually increase your physical activity as tolerated.  Refrain from any heavy lifting or strenuous use of your arms and shoulders until at least 8 weeks from the time of your surgery, and avoid activities that cause increased pain in your chest on the side of your surgical incision.  Otherwise you may continue to increase activities without any particular  limitations.  Increase the intensity and duration of physical activity gradually.        

## 2018-05-12 DIAGNOSIS — I4891 Unspecified atrial fibrillation: Secondary | ICD-10-CM | POA: Diagnosis not present

## 2018-05-12 DIAGNOSIS — Z9889 Other specified postprocedural states: Secondary | ICD-10-CM | POA: Diagnosis not present

## 2018-05-13 ENCOUNTER — Ambulatory Visit: Payer: Medicare Other

## 2018-05-17 DIAGNOSIS — I499 Cardiac arrhythmia, unspecified: Secondary | ICD-10-CM | POA: Diagnosis not present

## 2018-05-18 DIAGNOSIS — I4891 Unspecified atrial fibrillation: Secondary | ICD-10-CM | POA: Diagnosis not present

## 2018-05-18 DIAGNOSIS — Z9889 Other specified postprocedural states: Secondary | ICD-10-CM | POA: Diagnosis not present

## 2018-05-24 DIAGNOSIS — I4891 Unspecified atrial fibrillation: Secondary | ICD-10-CM | POA: Diagnosis not present

## 2018-05-31 DIAGNOSIS — Z7901 Long term (current) use of anticoagulants: Secondary | ICD-10-CM | POA: Diagnosis not present

## 2018-05-31 DIAGNOSIS — I4891 Unspecified atrial fibrillation: Secondary | ICD-10-CM | POA: Diagnosis not present

## 2018-05-31 DIAGNOSIS — I499 Cardiac arrhythmia, unspecified: Secondary | ICD-10-CM | POA: Diagnosis not present

## 2018-06-01 DIAGNOSIS — Z8679 Personal history of other diseases of the circulatory system: Secondary | ICD-10-CM | POA: Diagnosis not present

## 2018-06-01 DIAGNOSIS — Z9889 Other specified postprocedural states: Secondary | ICD-10-CM | POA: Diagnosis not present

## 2018-06-03 DIAGNOSIS — Z9889 Other specified postprocedural states: Secondary | ICD-10-CM | POA: Diagnosis not present

## 2018-06-03 DIAGNOSIS — Z8679 Personal history of other diseases of the circulatory system: Secondary | ICD-10-CM | POA: Diagnosis not present

## 2018-06-07 DIAGNOSIS — I4891 Unspecified atrial fibrillation: Secondary | ICD-10-CM | POA: Diagnosis not present

## 2018-06-07 DIAGNOSIS — Z8679 Personal history of other diseases of the circulatory system: Secondary | ICD-10-CM | POA: Diagnosis not present

## 2018-06-07 DIAGNOSIS — Z7901 Long term (current) use of anticoagulants: Secondary | ICD-10-CM | POA: Diagnosis not present

## 2018-06-07 DIAGNOSIS — Z9889 Other specified postprocedural states: Secondary | ICD-10-CM | POA: Diagnosis not present

## 2018-06-09 DIAGNOSIS — Z9889 Other specified postprocedural states: Secondary | ICD-10-CM | POA: Diagnosis not present

## 2018-06-09 DIAGNOSIS — Z8679 Personal history of other diseases of the circulatory system: Secondary | ICD-10-CM | POA: Diagnosis not present

## 2018-06-10 DIAGNOSIS — Z7901 Long term (current) use of anticoagulants: Secondary | ICD-10-CM | POA: Diagnosis not present

## 2018-06-14 DIAGNOSIS — Z9889 Other specified postprocedural states: Secondary | ICD-10-CM | POA: Diagnosis not present

## 2018-06-14 DIAGNOSIS — Z8679 Personal history of other diseases of the circulatory system: Secondary | ICD-10-CM | POA: Diagnosis not present

## 2018-06-15 DIAGNOSIS — Z7901 Long term (current) use of anticoagulants: Secondary | ICD-10-CM | POA: Diagnosis not present

## 2018-06-15 DIAGNOSIS — I4891 Unspecified atrial fibrillation: Secondary | ICD-10-CM | POA: Diagnosis not present

## 2018-06-16 DIAGNOSIS — Z8679 Personal history of other diseases of the circulatory system: Secondary | ICD-10-CM | POA: Diagnosis not present

## 2018-06-16 DIAGNOSIS — Z9889 Other specified postprocedural states: Secondary | ICD-10-CM | POA: Diagnosis not present

## 2018-06-18 DIAGNOSIS — Z9889 Other specified postprocedural states: Secondary | ICD-10-CM | POA: Diagnosis not present

## 2018-06-18 DIAGNOSIS — Z8679 Personal history of other diseases of the circulatory system: Secondary | ICD-10-CM | POA: Diagnosis not present

## 2018-06-21 DIAGNOSIS — Z8679 Personal history of other diseases of the circulatory system: Secondary | ICD-10-CM | POA: Diagnosis not present

## 2018-06-21 DIAGNOSIS — Z9889 Other specified postprocedural states: Secondary | ICD-10-CM | POA: Diagnosis not present

## 2018-06-24 DIAGNOSIS — I4891 Unspecified atrial fibrillation: Secondary | ICD-10-CM | POA: Diagnosis not present

## 2018-06-24 DIAGNOSIS — Z7901 Long term (current) use of anticoagulants: Secondary | ICD-10-CM | POA: Diagnosis not present

## 2018-06-25 DIAGNOSIS — Z9889 Other specified postprocedural states: Secondary | ICD-10-CM | POA: Diagnosis not present

## 2018-06-25 DIAGNOSIS — Z8679 Personal history of other diseases of the circulatory system: Secondary | ICD-10-CM | POA: Diagnosis not present

## 2018-06-28 DIAGNOSIS — Z9889 Other specified postprocedural states: Secondary | ICD-10-CM | POA: Diagnosis not present

## 2018-06-28 DIAGNOSIS — Z8679 Personal history of other diseases of the circulatory system: Secondary | ICD-10-CM | POA: Diagnosis not present

## 2018-07-01 DIAGNOSIS — Z7901 Long term (current) use of anticoagulants: Secondary | ICD-10-CM | POA: Diagnosis not present

## 2018-07-01 DIAGNOSIS — I4891 Unspecified atrial fibrillation: Secondary | ICD-10-CM | POA: Diagnosis not present

## 2018-07-02 DIAGNOSIS — Z8679 Personal history of other diseases of the circulatory system: Secondary | ICD-10-CM | POA: Diagnosis not present

## 2018-07-02 DIAGNOSIS — Z9889 Other specified postprocedural states: Secondary | ICD-10-CM | POA: Diagnosis not present

## 2018-07-05 DIAGNOSIS — Z9889 Other specified postprocedural states: Secondary | ICD-10-CM | POA: Diagnosis not present

## 2018-07-05 DIAGNOSIS — Z8679 Personal history of other diseases of the circulatory system: Secondary | ICD-10-CM | POA: Diagnosis not present

## 2018-07-07 DIAGNOSIS — Z7901 Long term (current) use of anticoagulants: Secondary | ICD-10-CM | POA: Diagnosis not present

## 2018-07-07 DIAGNOSIS — I4891 Unspecified atrial fibrillation: Secondary | ICD-10-CM | POA: Diagnosis not present

## 2018-07-07 DIAGNOSIS — Z9889 Other specified postprocedural states: Secondary | ICD-10-CM | POA: Diagnosis not present

## 2018-07-07 DIAGNOSIS — Z8679 Personal history of other diseases of the circulatory system: Secondary | ICD-10-CM | POA: Diagnosis not present

## 2018-07-09 DIAGNOSIS — Z8679 Personal history of other diseases of the circulatory system: Secondary | ICD-10-CM | POA: Diagnosis not present

## 2018-07-09 DIAGNOSIS — Z9889 Other specified postprocedural states: Secondary | ICD-10-CM | POA: Diagnosis not present

## 2018-07-12 DIAGNOSIS — Z9889 Other specified postprocedural states: Secondary | ICD-10-CM | POA: Diagnosis not present

## 2018-07-12 DIAGNOSIS — Z8679 Personal history of other diseases of the circulatory system: Secondary | ICD-10-CM | POA: Diagnosis not present

## 2018-07-12 NOTE — Progress Notes (Signed)
Cardiology Office Note    Date:  07/13/2018   ID:  Chase Smith., DOB [birth year], MRN [id number]  PCP:  Josetta Huddle, MD  Cardiologist: Fransico Him, MD EPS: None  Chief Complaint  Patient presents with  . Follow-up    History of Present Illness:  Chase Smith. is a 73 y.o. male with history of MVP PAF and hypertension who developed Streptococcus viridans bacteremia 12/2017 TEE demonstrated MVP with severe MR, no vegetation identified.  He was treated for presumed bacterial endocarditis for 6 weeks.  Cardiac catheterization showed normal coronary arteries and severe MR.  He underwent minimally invasive mitral valve repair and Maze procedure and clipping of the LA appendage 04/27/2018.   I saw the patient postop 05/11/2018.  He was maintaining normal sinus rhythm on low-dose carvedilol 6.25 mg twice daily.  He did have a first-degree AV block but heart rate was running 85 consistently which was higher than he was used to.  I did not increase his carvedilol.  Patient comes in for f/u. Sometimes he doesn't feel like he can take a full breath-just in the last day or so. Has had a bad cold past couple of weeks that he's getting over.  Doing cardiac rehab and feels well.  No cardiac complaints.   Past Medical History:  Diagnosis Date  . Abdominal mass, RLQ (right lower quadrant) 07/27/2012  . Benign essential HTN 02/07/2015  . BPH (benign prostatic hyperplasia)    mild  . Dilated aortic root (HCC)    34 mm by echo 12/2017  . ED (erectile dysfunction) 08/2011   cancelled f/u with Urology  . Epididymitis    recurrent  . Hypertension    in the past - resolved  . Left ventricular diastolic dysfunction, NYHA class 2   . Meckel's diverticulitis 08/27/2012  . Mitral valve regurgitation   . Moderate mitral regurgitation 08/08/2013  . MVP (mitral valve prolapse)    Bileaflet MVP with mild to moderate MR by echo 10/2017  . PAF (paroxysmal atrial fibrillation) ([place]) 05/13/2017   CHADS2VASC score is 2 (HTN and age > 21) on Apixaban  . S/P minimally invasive maze operation for atrial fibrillation 04/27/2018   Complete bilateral atrial lesion set using cryothermy and bipolar radiofrequency ablation with clipping of LA appendage via right mini thoracotomy approach  . S/P minimally invasive mitral valve repair 04/27/2018   Complex valvuloplasty including triangular resection of P2 segment of posterior leaflet, artificial Gore-tex neochord placement x8 and Sorin Memo 4D ring annulopasty via right mini thoracotomy approach  . Vertigo    episodic mild positional    Past Surgical History:  Procedure Laterality Date  . APPENDECTOMY    . CARDIAC CATHETERIZATION  03/24/2018  . CLIPPING OF ATRIAL APPENDAGE  04/27/2018   Procedure: CLIPPING OF ATRIAL APPENDAGE;  Surgeon: Rexene Alberts, MD;  Location: [facility];  Service: Open Heart Surgery;;  . LAPAROSCOPIC APPENDECTOMY N/A 08/11/2012   Procedure: APPENDECTOMY LAPAROSCOPIC;  Surgeon: Stark Klein, MD;  Location: [facility];  Service: General;  Laterality: N/A;  . LAPAROSCOPIC SMALL BOWEL RESECTION N/A 08/11/2012   Procedure: Diagnostic Laparoscopy,  Diverticulectomy and laparoscopic appendectomy;  Surgeon: Stark Klein, MD;  Location: [facility];  Service: General;  Laterality: N/A;  Diagnostic Laparoscopy, Small Bowel Resection vs Diverticulectomy   . MINIMALLY INVASIVE MAZE PROCEDURE N/A 04/27/2018   Procedure: MINIMALLY INVASIVE MAZE PROCEDURE;  Surgeon: Rexene Alberts, MD;  Location: [facility];  Service: Open Heart Surgery;  Laterality: N/A;  .  MITRAL VALVE REPAIR Right 04/27/2018   Procedure: MINIMALLY INVASIVE MITRAL VALVE REPAIR (MVR);  Surgeon: Rexene Alberts, MD;  Location: [facility];  Service: Open Heart Surgery;  Laterality: Right;  . PILONIDAL CYST EXCISION    . RECONSTRUCTION OF NOSE     secondary to deviated septum  . RIGHT/LEFT HEART CATH AND CORONARY ANGIOGRAPHY N/A 03/24/2018   Procedure: RIGHT/LEFT HEART CATH AND CORONARY  ANGIOGRAPHY;  Surgeon: Jettie Booze, MD;  Location: [facility];  Service: Cardiovascular;  Laterality: N/A;  . TEE WITHOUT CARDIOVERSION N/A 08/08/2013   Procedure: TRANSESOPHAGEAL ECHOCARDIOGRAM (TEE);  Surgeon: Sueanne Margarita, MD;  Location: [facility];  Service: Cardiovascular;  Laterality: N/A;  . TEE WITHOUT CARDIOVERSION N/A 01/05/2018   Procedure: TRANSESOPHAGEAL ECHOCARDIOGRAM (TEE);  Surgeon: Lelon Perla, MD;  Location[facility];  Service: Cardiovascular;  Laterality: N/A;  . TEE WITHOUT CARDIOVERSION N/A 04/27/2018   Procedure: TRANSESOPHAGEAL ECHOCARDIOGRAM (TEE);  Surgeon: Rexene Alberts, MD;  Location: [facility];  Service: Open Heart Surgery;  Laterality: N/A;  . TONSILLECTOMY      Current Medications: Current Meds  Medication Sig  . acetaminophen (TYLENOL) 500 MG tablet Take 500-1,000 mg by mouth every 6 (six) hours as needed for moderate pain or headache.  Marland Kitchen aspirin EC 81 MG EC tablet Take 1 tablet (81 mg total) by mouth daily.  . carvedilol (COREG) 6.25 MG tablet Take 1 tablet (6.25 mg total) by mouth 2 (two) times daily with a meal.  . Cholecalciferol (VITAMIN D) 2000 UNITS tablet Take 2,000 Units daily by mouth.   . Coenzyme Q10 (CO Q-10 PO) Take 1 capsule by mouth daily.  . Cyanocobalamin (VITAMIN B12) 500 MCG TABS Take 500 mcg by mouth daily.   . flunisolide (NASALIDE) 25 MCG/ACT (0.025%) SOLN Place 2 sprays into the nose daily as needed (for allergies).   Marland Kitchen guaiFENesin (MUCINEX) 600 MG 12 hr tablet Take 1 tablet (600 mg total) by mouth 2 (two) times daily as needed for cough or to loosen phlegm.  Marland Kitchen losartan (COZAAR) 50 MG tablet Take 25 mg by mouth daily.   . Polyvinyl Alcohol-Povidone PF (REFRESH) 1.4-0.6 % SOLN Place 1 drop into both eyes 3 (three) times daily as needed (for dry eyes).  . rosuvastatin (CRESTOR) 5 MG tablet Take 5 mg by mouth every Monday, Wednesday, and Friday.   . warfarin (COUMADIN) 5 MG tablet Take 1 tablet (5 mg total) by mouth  daily at 6 PM. Or as directed (Patient taking differently: Take 5.5 mg by mouth daily at 6 PM. Or as directed)     Allergies:   Ace inhibitors; Sulfa antibiotics; and Antihistamines, chlorpheniramine-type   Social History   Socioeconomic History  . Marital status: Married    Spouse name: Not on file  . Number of children: Not on file  . Years of education: Not on file  . Highest education level: Not on file  Occupational History  . Not on file  Social Needs  . Financial resource strain: Not on file  . Food insecurity:    Worry: Not on file    Inability: Not on file  . Transportation needs:    Medical: Not on file    Non-medical: Not on file  Tobacco Use  . Smoking status: Never Smoker  . Smokeless tobacco: Never Used  Substance and Sexual Activity  . Alcohol use: Yes    Comment: one drink per day (wine or beer)  . Drug use: No  . Sexual  activity: Yes  Lifestyle  . Physical activity:    Days per week: Not on file    Minutes per session: Not on file  . Stress: Not on file  Relationships  . Social connections:    Talks on phone: Not on file    Gets together: Not on file    Attends religious service: Not on file    Active member of club or organization: Not on file    Attends meetings of clubs or organizations: Not on file    Relationship status: Not on file  Other Topics Concern  . Not on file  Social History Narrative  . Not on file     Family History:  The patient's family history includes Cancer in his father, maternal aunt, maternal uncle, mother, and paternal uncle; Heart failure in his father.   ROS:   Please see the history of present illness.    Review of Systems  Constitution: Negative.  HENT: Negative.   Cardiovascular: Negative.   Respiratory: Negative.   Endocrine: Negative.   Hematologic/Lymphatic: Negative.   Musculoskeletal: Negative.   Gastrointestinal: Negative.   Genitourinary: Negative.   Neurological: Negative.    All other systems  reviewed and are negative.   PHYSICAL EXAM:   VS:  BP 112/80   Pulse 73   Ht 6' (1.829 m)   Wt 206 lb 12.8 oz (93.8 kg)   SpO2 99%   BMI 28.05 kg/m   Physical Exam  GEN: Well nourished, well developed, in no acute distress  Neck: no JVD, carotid bruits, or masses Cardiac:RRR; no murmurs, rubs, or gallops  Respiratory:  clear to auscultation bilaterally, normal work of breathing GI: soft, nontender, nondistended, + BS Ext: without cyanosis, clubbing, or edema, Good distal pulses bilaterally Neuro:  Alert and Oriented x 3 Psych: euthymic mood, full affect  Wt Readings from Last 3 Encounters:  07/13/18 206 lb 12.8 oz (93.8 kg)  05/11/18 207 lb (93.9 kg)  05/11/18 207 lb 12.8 oz (94.3 kg)      Studies/Labs Reviewed:   EKG:  EKG is not ordered today.   Recent Labs: 04/23/2018: ALT 16 04/28/2018: Magnesium 2.8 05/01/2018: BUN 19; Creatinine, Ser 1.00; Hemoglobin 12.2; Platelets 127; Potassium 3.8; Sodium 133   Lipid Panel No results found for: CHOL, TRIG, HDL, CHOLHDL, VLDL, LDLCALC, LDLDIRECT  Additional studies/ records that were reviewed today include:   TEE 04/27/2018 Result status: Final result   Septum: No Patent Foramen Ovale present.  Left atrium: Patent foramen ovale not present.  Aortic valve: No stenosis. Trace regurgitation. No AV vegetation.  Mitral valve: Moderate regurgitation. Flail portion of the posterior leaflet involving the middle scallop.  Right ventricle: Normal cavity size, wall thickness and ejection fraction. No thrombus present. No mass present.  Pulmonic valve: Trace regurgitation.          ASSESSMENT:    1. S/P minimally invasive mitral valve repair + maze procedure   2. PAF (paroxysmal atrial fibrillation) ([place])   3. Benign essential HTN   4. Left ventricular diastolic dysfunction, NYHA class 2   5. Streptococcus viridans infection      PLAN:  In order of problems listed above:  Status post minimally invasive mitral  valve repair and Maze procedure with LA clipping 04/27/2018.  Doing cardiac rehab in [facility].  Overall has recovered nicely.  Continue cardiac rehab.  Follow-up with Dr. Radford Pax in 3 months.  PAF maintaining normal sinus rhythm on carvedilol 6.25 mg twice daily continue current  dose.  Benign essential hypertension blood pressure well controlled on losartan 25 mg daily.  Renal function normal in November.  LV diastolic dysfunction no heart failure on exam  Streptococcus viridans infection with presumed SBE 12/2017  Medication Adjustments/Labs and Tests Ordered: Current medicines are reviewed at length with the patient today.  Concerns regarding medicines are outlined  above.  Medication changes, Labs and Tests ordered today are listed in the Patient Instructions below. Patient Instructions  Medication Instructions:  Your physician recommends that you continue on your current medications as directed. Please refer to the Current Medication list given to you today.  If you need a refill on your cardiac medications before your next appointment, please call your pharmacy.   Lab work: None Ordered  If you have labs (blood work) drawn today and your tests are completely normal, you will receive your results only by: Marland Kitchen MyChart Message (if you have MyChart) OR . A paper copy in the mail If you have any lab test that is abnormal or we need to change your treatment, we will call you to review the results.  Testing/Procedures: None ordered  Follow-Up: . Follow up with Dr. Radford Pax on 10/19/18 at 11:00 AM  Any Other Special Instructions Will Be Listed Below (If Applicable).       Sumner Boast, PA-C  07/13/2018 10:54 AM    [facility] [address], [place], [place]  [address] Phone: [phone]; Fax: [phone]

## 2018-07-13 ENCOUNTER — Ambulatory Visit (INDEPENDENT_AMBULATORY_CARE_PROVIDER_SITE_OTHER): Payer: Medicare Other | Admitting: Physician Assistant

## 2018-07-13 ENCOUNTER — Encounter: Payer: Self-pay | Admitting: Physician Assistant

## 2018-07-13 VITALS — BP 112/80 | HR 73 | Ht 72.0 in | Wt 206.8 lb

## 2018-07-13 DIAGNOSIS — Z9889 Other specified postprocedural states: Secondary | ICD-10-CM

## 2018-07-13 DIAGNOSIS — I5189 Other ill-defined heart diseases: Secondary | ICD-10-CM

## 2018-07-13 DIAGNOSIS — I480 Paroxysmal atrial fibrillation: Secondary | ICD-10-CM | POA: Diagnosis not present

## 2018-07-13 DIAGNOSIS — I519 Heart disease, unspecified: Secondary | ICD-10-CM

## 2018-07-13 DIAGNOSIS — I10 Essential (primary) hypertension: Secondary | ICD-10-CM

## 2018-07-13 DIAGNOSIS — A491 Streptococcal infection, unspecified site: Secondary | ICD-10-CM

## 2018-07-13 NOTE — Patient Instructions (Signed)
Medication Instructions:  Your physician recommends that you continue on your current medications as directed. Please refer to the Current Medication list given to you today.  If you need a refill on your cardiac medications before your next appointment, please call your pharmacy.   Lab work: None Ordered  If you have labs (blood work) drawn today and your tests are completely normal, you will receive your results only by: Marland Kitchen MyChart Message (if you have MyChart) OR . A paper copy in the mail If you have any lab test that is abnormal or we need to change your treatment, we will call you to review the results.  Testing/Procedures: None ordered  Follow-Up: . Follow up with Dr. Radford Pax on 10/19/18 at 11:00 AM  Any Other Special Instructions Will Be Listed Below (If Applicable).

## 2018-07-14 DIAGNOSIS — Z9889 Other specified postprocedural states: Secondary | ICD-10-CM | POA: Diagnosis not present

## 2018-07-14 DIAGNOSIS — Z8679 Personal history of other diseases of the circulatory system: Secondary | ICD-10-CM | POA: Diagnosis not present

## 2018-07-15 DIAGNOSIS — Z7901 Long term (current) use of anticoagulants: Secondary | ICD-10-CM | POA: Diagnosis not present

## 2018-07-15 DIAGNOSIS — I4891 Unspecified atrial fibrillation: Secondary | ICD-10-CM | POA: Diagnosis not present

## 2018-07-16 DIAGNOSIS — Z8679 Personal history of other diseases of the circulatory system: Secondary | ICD-10-CM | POA: Diagnosis not present

## 2018-07-16 DIAGNOSIS — Z9889 Other specified postprocedural states: Secondary | ICD-10-CM | POA: Diagnosis not present

## 2018-07-19 DIAGNOSIS — H25813 Combined forms of age-related cataract, bilateral: Secondary | ICD-10-CM | POA: Diagnosis not present

## 2018-07-19 DIAGNOSIS — H52203 Unspecified astigmatism, bilateral: Secondary | ICD-10-CM | POA: Diagnosis not present

## 2018-07-19 DIAGNOSIS — H11001 Unspecified pterygium of right eye: Secondary | ICD-10-CM | POA: Diagnosis not present

## 2018-07-19 DIAGNOSIS — H1789 Other corneal scars and opacities: Secondary | ICD-10-CM | POA: Diagnosis not present

## 2018-07-21 DIAGNOSIS — Z9889 Other specified postprocedural states: Secondary | ICD-10-CM | POA: Diagnosis not present

## 2018-07-21 DIAGNOSIS — Z7901 Long term (current) use of anticoagulants: Secondary | ICD-10-CM | POA: Diagnosis not present

## 2018-07-21 DIAGNOSIS — I4891 Unspecified atrial fibrillation: Secondary | ICD-10-CM | POA: Diagnosis not present

## 2018-07-21 DIAGNOSIS — Z8679 Personal history of other diseases of the circulatory system: Secondary | ICD-10-CM | POA: Diagnosis not present

## 2018-07-26 DIAGNOSIS — Z8679 Personal history of other diseases of the circulatory system: Secondary | ICD-10-CM | POA: Diagnosis not present

## 2018-07-26 DIAGNOSIS — Z9889 Other specified postprocedural states: Secondary | ICD-10-CM | POA: Diagnosis not present

## 2018-07-28 DIAGNOSIS — Z9889 Other specified postprocedural states: Secondary | ICD-10-CM | POA: Diagnosis not present

## 2018-07-28 DIAGNOSIS — Z7901 Long term (current) use of anticoagulants: Secondary | ICD-10-CM | POA: Diagnosis not present

## 2018-07-28 DIAGNOSIS — I4891 Unspecified atrial fibrillation: Secondary | ICD-10-CM | POA: Diagnosis not present

## 2018-07-28 DIAGNOSIS — Z8679 Personal history of other diseases of the circulatory system: Secondary | ICD-10-CM | POA: Diagnosis not present

## 2018-07-30 DIAGNOSIS — Z8679 Personal history of other diseases of the circulatory system: Secondary | ICD-10-CM | POA: Diagnosis not present

## 2018-07-30 DIAGNOSIS — Z9889 Other specified postprocedural states: Secondary | ICD-10-CM | POA: Diagnosis not present

## 2018-08-02 ENCOUNTER — Other Ambulatory Visit: Payer: Self-pay

## 2018-08-02 ENCOUNTER — Ambulatory Visit (INDEPENDENT_AMBULATORY_CARE_PROVIDER_SITE_OTHER): Payer: Medicare Other | Admitting: Thoracic Surgery (Cardiothoracic Vascular Surgery)

## 2018-08-02 ENCOUNTER — Other Ambulatory Visit: Payer: Self-pay | Admitting: *Deleted

## 2018-08-02 ENCOUNTER — Encounter: Payer: Self-pay | Admitting: Thoracic Surgery (Cardiothoracic Vascular Surgery)

## 2018-08-02 VITALS — BP 112/80 | HR 77 | Resp 18 | Ht 72.0 in | Wt 203.8 lb

## 2018-08-02 DIAGNOSIS — I10 Essential (primary) hypertension: Secondary | ICD-10-CM

## 2018-08-02 DIAGNOSIS — Z8679 Personal history of other diseases of the circulatory system: Secondary | ICD-10-CM

## 2018-08-02 DIAGNOSIS — I480 Paroxysmal atrial fibrillation: Secondary | ICD-10-CM | POA: Diagnosis not present

## 2018-08-02 DIAGNOSIS — Z9889 Other specified postprocedural states: Secondary | ICD-10-CM | POA: Diagnosis not present

## 2018-08-02 DIAGNOSIS — I4891 Unspecified atrial fibrillation: Secondary | ICD-10-CM

## 2018-08-02 NOTE — Patient Instructions (Addendum)
Continue all previous medications without any changes at this time  Discuss with Dr Radford Pax whether or not you should stay on Coumadin, switch back to Eliquis, or stop long term blood thinners other than aspirin  You may resume unrestricted physical activity without any particular limitations at this time.  Endocarditis is a potentially serious infection of heart valves or inside lining of the heart.  It occurs more commonly in patients with diseased heart valves (such as patient's with aortic or mitral valve disease) and in patients who have undergone heart valve repair or replacement.  Certain surgical and dental procedures may put you at risk, such as dental cleaning, other dental procedures, or any surgery involving the respiratory, urinary, gastrointestinal tract, gallbladder or prostate gland.   To minimize your chances for develooping endocarditis, maintain good oral health and seek prompt medical attention for any infections involving the mouth, teeth, gums, skin or urinary tract.    Always notify your doctor or dentist about your underlying heart valve condition before having any invasive procedures. You will need to take antibiotics before certain procedures, including all routine dental cleanings or other dental procedures.  Your cardiologist or dentist should prescribe these antibiotics for you to be taken ahead of time.

## 2018-08-02 NOTE — Progress Notes (Signed)
[address]       [place] [address]             [phone]     CARDIOTHORACIC SURGERY OFFICE NOTE  Referring Provider is Sherren Mocha, MD  Primary Cardiologist is Sueanne Margarita, MD PCP is Josetta Huddle, MD   HPI:  Patient is a 73 year old male with history of mitral valve prolapse with mitral regurgitation, recurrent paroxysmal atrial fibrillation and hypertension who returns to the office today for routine follow-up status post minimally invasive mitral valve repair and Maze procedure on April 27, 2018.  He was last seen here in our office on May 11, 2018 at which time he was doing very well.  He was seen by Ermalinda Barrios at [facility] that same day and EKG revealed sinus rhythm with first-degree AV block.  He has not been seen in follow-up since then and he has not had a routine follow-up echocardiogram performed.  He does have a follow-up appointment scheduled with Dr. Radford Pax on October 19, 2018.  He returns her office today and reports that he is doing fine.  He has no residual soreness in his chest and he denies any symptoms of exertional shortness of breath.  He notes that his breathing and exercise tolerance seem better than they were prior to his surgery.  He has not had any palpitations or other symptoms to suggest a recurrence of atrial fibrillation.   Current Outpatient Medications  Medication Sig Dispense Refill  . acetaminophen (TYLENOL) 500 MG tablet Take 500-1,000 mg by mouth every 6 (six) hours as needed for moderate pain or headache.    Marland Kitchen aspirin EC 81 MG EC tablet Take 1 tablet (81 mg total) by mouth daily.    . carvedilol (COREG) 6.25 MG tablet Take 1 tablet (6.25 mg total) by mouth 2 (two) times daily with a meal. 60 tablet 3  . Cholecalciferol (VITAMIN D) 2000 UNITS tablet Take 2,000 Units daily by mouth.     . Coenzyme Q10 (CO Q-10 PO) Take 1 capsule by mouth daily.    . Cyanocobalamin (VITAMIN B12) 500 MCG TABS Take 500 mcg by  mouth daily. Take for 5 days only.    . flunisolide (NASALIDE) 25 MCG/ACT (0.025%) SOLN Place 2 sprays into the nose daily as needed (for allergies).     Marland Kitchen guaiFENesin (MUCINEX) 600 MG 12 hr tablet Take 1 tablet (600 mg total) by mouth 2 (two) times daily as needed for cough or to loosen phlegm.    Marland Kitchen losartan (COZAAR) 50 MG tablet Take 25 mg by mouth daily.     . Polyvinyl Alcohol-Povidone PF (REFRESH) 1.4-0.6 % SOLN Place 1 drop into both eyes 3 (three) times daily as needed (for dry eyes).    . rosuvastatin (CRESTOR) 5 MG tablet Take 5 mg by mouth every Monday, Wednesday, and Friday.   3  . warfarin (COUMADIN) 5 MG tablet Take 1 tablet (5 mg total) by mouth daily at 6 PM. Or as directed (Patient taking differently: Take 4.5 mg by mouth daily at 6 PM. Or as directed) 30 tablet 1   No current facility-administered medications for this visit.       Physical Exam:   BP 112/80 (BP Location: Left Arm, Patient Position: Sitting, Cuff Size: Large)   Pulse 77   Resp 18   Ht 6' (1.829 m)   Wt 203 lb 12.8 oz (92.4 kg)   SpO2 97% Comment: RA  BMI  27.64 kg/m   General:  Well-appearing  Chest:   Clear to auscultation  CV:   Regular rate and rhythm without murmur  Incisions:  Completely healed  Abdomen:  Soft nontender  Extremities:  Warm and well-perfused, no edema  Diagnostic Tests:  2 channel telemetry rhythm strip demonstrates normal sinus rhythm   Impression:  Patient is doing very well and maintaining sinus rhythm approximately 74-month status post minimally invasive mitral valve repair and Maze procedure   Plan:  We have not recommended any changes to the patient's current medications.  However, I have encouraged the patient to discuss with Dr. Radford Pax plans for long-term anticoagulation if he continues to remain in sinus rhythm when he returns to see her for routine follow-up in April.  If long-term anticoagulation for his history of atrial fibrillation is felt to be indicated, it  would be reasonable to consider switching back to Eliquis.  Alternatively, other than his age he has relatively few risk factors and it might be reasonable to consider stopping long-term anticoagulation altogether if he continues to maintain sinus rhythm following Maze procedure.  He will need routine follow-up echocardiogram performed.  I have encouraged the patient to continue to increase his physical activity without any particular limitations.  The patient has been reminded regarding the importance of dental hygiene and the lifelong need for antibiotic prophylaxis for all dental cleanings and other related invasive procedures.  He will return to our office for routine follow-up next fall, approximately 1 year following his surgery.  He will call and return sooner should specific problems or questions arise.  I spent in excess of 15 minutes during the conduct of this office consultation and >50% of this time involved direct face-to-face encounter with the patient for counseling and/or coordination of their care.    Valentina Gu. Roxy Manns, MD 08/02/2018 3:36 PM

## 2018-08-04 ENCOUNTER — Other Ambulatory Visit: Payer: Self-pay | Admitting: Cardiology

## 2018-08-09 DIAGNOSIS — I4891 Unspecified atrial fibrillation: Secondary | ICD-10-CM | POA: Diagnosis not present

## 2018-08-09 DIAGNOSIS — Z7901 Long term (current) use of anticoagulants: Secondary | ICD-10-CM | POA: Diagnosis not present

## 2018-08-13 DIAGNOSIS — L570 Actinic keratosis: Secondary | ICD-10-CM | POA: Diagnosis not present

## 2018-08-13 DIAGNOSIS — L578 Other skin changes due to chronic exposure to nonionizing radiation: Secondary | ICD-10-CM | POA: Diagnosis not present

## 2018-08-17 DIAGNOSIS — I4891 Unspecified atrial fibrillation: Secondary | ICD-10-CM | POA: Diagnosis not present

## 2018-08-23 DIAGNOSIS — I4891 Unspecified atrial fibrillation: Secondary | ICD-10-CM | POA: Diagnosis not present

## 2018-08-24 DIAGNOSIS — R0789 Other chest pain: Secondary | ICD-10-CM | POA: Diagnosis not present

## 2018-08-24 DIAGNOSIS — Z0000 Encounter for general adult medical examination without abnormal findings: Secondary | ICD-10-CM | POA: Diagnosis not present

## 2018-08-24 DIAGNOSIS — E559 Vitamin D deficiency, unspecified: Secondary | ICD-10-CM | POA: Diagnosis not present

## 2018-08-24 DIAGNOSIS — I10 Essential (primary) hypertension: Secondary | ICD-10-CM | POA: Diagnosis not present

## 2018-08-24 DIAGNOSIS — Z1211 Encounter for screening for malignant neoplasm of colon: Secondary | ICD-10-CM | POA: Diagnosis not present

## 2018-08-24 DIAGNOSIS — Z1389 Encounter for screening for other disorder: Secondary | ICD-10-CM | POA: Diagnosis not present

## 2018-08-24 DIAGNOSIS — Z952 Presence of prosthetic heart valve: Secondary | ICD-10-CM | POA: Diagnosis not present

## 2018-08-24 DIAGNOSIS — Z125 Encounter for screening for malignant neoplasm of prostate: Secondary | ICD-10-CM | POA: Diagnosis not present

## 2018-08-24 DIAGNOSIS — Z79899 Other long term (current) drug therapy: Secondary | ICD-10-CM | POA: Diagnosis not present

## 2018-08-30 ENCOUNTER — Other Ambulatory Visit: Payer: Self-pay | Admitting: Physician Assistant

## 2018-08-30 NOTE — Telephone Encounter (Signed)
See 09/2017 phone note, OK to refill

## 2018-08-31 DIAGNOSIS — I4891 Unspecified atrial fibrillation: Secondary | ICD-10-CM | POA: Diagnosis not present

## 2018-09-07 DIAGNOSIS — Z7901 Long term (current) use of anticoagulants: Secondary | ICD-10-CM | POA: Diagnosis not present

## 2018-09-17 ENCOUNTER — Telehealth: Payer: Self-pay | Admitting: Cardiology

## 2018-09-17 NOTE — Telephone Encounter (Signed)
Spoke with the patient, advised that Dr. Radford Pax sent a message to Dr. Rayann Heman to review. Advised once we knew the answer we would reach out to him.

## 2018-09-17 NOTE — Telephone Encounter (Signed)
New Message    Pt is wondering if he can go back on his Eliquis  Pt said he sent an email a week ago through [facility] and got no response Please call

## 2018-09-18 NOTE — Telephone Encounter (Signed)
I'm fine with him going back on Apixaban  but this will need to be done under the guidance of coumadin clinic in regards to when stop start apixaban after stopping coumadin - please forward to [facility] for direction

## 2018-09-20 DIAGNOSIS — I4891 Unspecified atrial fibrillation: Secondary | ICD-10-CM | POA: Diagnosis not present

## 2018-09-20 DIAGNOSIS — Z7901 Long term (current) use of anticoagulants: Secondary | ICD-10-CM | POA: Diagnosis not present

## 2018-09-20 NOTE — Telephone Encounter (Addendum)
Spoke with pt and advised him we will need an INR check in order to switch him to Eliquis. He last had his INR checked 2 weeks ago. He will have his INR checked locally tomorrow and will call us with the result so that we can advise when he will take his first dose of Eliquis. He will qualify for 5mg  BID due to age < 45 and weight > 60kg. Renal function stable - BMET and CBC last checked in November 2019.

## 2018-09-21 NOTE — Telephone Encounter (Signed)
INR checked yesterday was 1.8. Pt aware to resume Eliquis 5mg  BID and stop warfarin.

## 2018-10-15 ENCOUNTER — Telehealth: Payer: Self-pay | Admitting: Internal Medicine

## 2018-10-15 NOTE — Telephone Encounter (Signed)
Case reviewed Pt s/p MV repair and MAZE   Saw C Owen in Feb   This is a routine f/u With COVID pandemic recomm postpoining until July when infection issues improve

## 2018-10-18 ENCOUNTER — Telehealth: Payer: Self-pay

## 2018-10-18 ENCOUNTER — Encounter: Payer: Self-pay | Admitting: Cardiology

## 2018-10-18 NOTE — Progress Notes (Signed)
Virtual Visit via Video Note   This visit type was conducted due to national recommendations for restrictions regarding the COVID-19 Pandemic (e.g. social distancing) in an effort to limit this patient's exposure and mitigate transmission in our community.  Due to his co-morbid illnesses, this patient is at least at moderate risk for complications without adequate follow up.  This format is felt to be most appropriate for this patient at this time.  All issues noted in this document were discussed and addressed.  A limited physical exam was performed with this format.  Please refer to the patient's chart for his consent to telehealth for [facility].  Evaluation Performed:  Follow-up visit  This visit type was conducted due to national recommendations for restrictions regarding the COVID-19 Pandemic (e.g. social distancing).  This format is felt to be most appropriate for this patient at this time.  All issues noted in this document were discussed and addressed.  No physical exam was performed (except for noted visual exam findings with Video Visits).  Please refer to the patient's chart (MyChart message for video visits and phone note for telephone visits) for the patient's consent to telehealth for [facility]  Date:  10/19/2018   ID:  Chase Martin., DOB [birth year], MRN [id number]  Patient Location:  Home  Provider location:   [place]  PCP:  Josetta Huddle, MD  Cardiologist:  Fransico Him, MD  Electrophysiologist:  None   Chief Complaint:  Atrial fibrilltaion, MR and HTN  History of Present Illness:    Chase Larkey. is a 73 y.o. male who presents via audio/video conferencing for a telehealth visit today.   Chase Groeneveld. is a 73 y.o. male with a hx of bileaflet mitral valve prolapse with mildMR, dilated aortic root, grade 2 diastolic dysfunction and PAF.  He is on Eliquis for a CHADS2VASC score of 4.   He was admitted with strep viridans bacteremia in July 2019.   2D echo 12/2017 showed normal LVF with EF 60-65% with severe holosystolic MVP of the posterior MV leaflet with severe MR that was eccentric.  Due to bacteremia patient underwent TEE which confirmed severe mitral prolapse of the PMLV with severe MR.  There was also an oscillating density that was felt to possibly be due to a ruptured cord but could not rule out small vegetation and patient was treated with a prolonged course of antibx for endocarditis.    Followup 2D echo 02/2018 showed normal LVF with flail segment of posterior MVL with moderate to severe anteriorly directed MR.  He was referred to structural heart team and underwent right and left heart cath showing normal coronary arteries and severe MR and underwent minimally invasive MV repair and MAZE procedure with LA appendage clipping 04/27/2018.  He had a followup 2D echo pending but postponed due to [place] pandemic.   He is here today for followup and is doing well.  He denies any chest pain or pressure, SOB, DOE, PND, orthopnea, LE edema, dizziness, palpitations or syncope. He is compliant with his meds and is tolerating meds with no SE.    The patient does not have symptoms concerning for COVID-19 infection (fever, chills, cough, or new shortness of breath).    Prior CV studies:   The following studies were reviewed today:  TEE 12/2017, 2D echo 03/03/2018, cardiac cath 02/2018  Past Medical History:  Diagnosis Date  . Abdominal mass, RLQ (right lower quadrant) 07/27/2012  . Ascending aortic aneurysm ([place])  24mm by echo 02/2018 but normal by Chest CTA  . Benign essential HTN 02/07/2015  . BPH (benign prostatic hyperplasia)    mild  . ED (erectile dysfunction) 08/2011   cancelled f/u with Urology  . Epididymitis    recurrent  . Hypertension    in the past - resolved  . Left ventricular diastolic dysfunction, NYHA class 2   . Meckel's diverticulitis 08/27/2012  . MVP (mitral valve prolapse)    Bileaflet MVP with partially flail  segment of the posterior leaflt with 4+ MR in setting of strep viridans bacteremia s/p minimally invasive MV repari  . PAF (paroxysmal atrial fibrillation) ([place]) 05/13/2017   CHADS2VASC score is 2 (HTN and age > 51) on Apixaban  . S/P minimally invasive maze operation for atrial fibrillation 04/27/2018   Complete bilateral atrial lesion set using cryothermy and bipolar radiofrequency ablation with clipping of LA appendage via right mini thoracotomy approach  . S/P minimally invasive mitral valve repair 04/27/2018   Complex valvuloplasty including triangular resection of P2 segment of posterior leaflet, artificial Gore-tex neochord placement x8 and Sorin Memo 4D ring annulopasty via right mini thoracotomy approach  . Vertigo    episodic mild positional   Past Surgical History:  Procedure Laterality Date  . APPENDECTOMY    . CARDIAC CATHETERIZATION  03/24/2018  . CLIPPING OF ATRIAL APPENDAGE  04/27/2018   Procedure: CLIPPING OF ATRIAL APPENDAGE;  Surgeon: Rexene Alberts, MD;  Location: [place];  Service: Open Heart Surgery;;  . LAPAROSCOPIC APPENDECTOMY N/A 08/11/2012   Procedure: APPENDECTOMY LAPAROSCOPIC;  Surgeon: Stark Klein, MD;  Location: [facility];  Service: General;  Laterality: N/A;  . LAPAROSCOPIC SMALL BOWEL RESECTION N/A 08/11/2012   Procedure: Diagnostic Laparoscopy,  Diverticulectomy and laparoscopic appendectomy;  Surgeon: Stark Klein, MD;  Location: [facility];  Service: General;  Laterality: N/A;  Diagnostic Laparoscopy, Small Bowel Resection vs Diverticulectomy   . MINIMALLY INVASIVE MAZE PROCEDURE N/A 04/27/2018   Procedure: MINIMALLY INVASIVE MAZE PROCEDURE;  Surgeon: Rexene Alberts, MD;  Location: [place];  Service: Open Heart Surgery;  Laterality: N/A;  . MITRAL VALVE REPAIR Right 04/27/2018   Procedure: MINIMALLY INVASIVE MITRAL VALVE REPAIR (MVR);  Surgeon: Rexene Alberts, MD;  Location: [place];  Service: Open Heart Surgery;  Laterality: Right;  . PILONIDAL CYST EXCISION    .  RECONSTRUCTION OF NOSE     secondary to deviated septum  . RIGHT/LEFT HEART CATH AND CORONARY ANGIOGRAPHY N/A 03/24/2018   Procedure: RIGHT/LEFT HEART CATH AND CORONARY ANGIOGRAPHY;  Surgeon: Jettie Booze, MD;  Location: [place] [facility];  Service: Cardiovascular;  Laterality: N/A;  . TEE WITHOUT CARDIOVERSION N/A 08/08/2013   Procedure: TRANSESOPHAGEAL ECHOCARDIOGRAM (TEE);  Surgeon: Sueanne Margarita, MD;  [facility];  Service: Cardiovascular;  Laterality: N/A;  . TEE WITHOUT CARDIOVERSION N/A 01/05/2018   Procedure: TRANSESOPHAGEAL ECHOCARDIOGRAM (TEE);  Surgeon: Lelon Perla, MD;  Location: [facility] ENDOSCOPY;  Service: Cardiovascular;  Laterality: N/A;  . TEE WITHOUT CARDIOVERSION N/A 04/27/2018   Procedure: TRANSESOPHAGEAL ECHOCARDIOGRAM (TEE);  Surgeon: Rexene Alberts, MD;  Location: [place];  Service: Open Heart Surgery;  Laterality: N/A;  . TONSILLECTOMY       Current Meds  Medication Sig  . acetaminophen (TYLENOL) 500 MG tablet Take 500-1,000 mg by mouth every 6 (six) hours as needed for moderate pain or headache.  Marland Kitchen amoxicillin (AMOXIL) 500 MG capsule TAKE FOUR CAPSULES BY MOUTH 30-60 minutes prior TO dental procedures  . apixaban (ELIQUIS) 5 MG TABS  tablet Take 1 tablet (5 mg total) by mouth 2 (two) times daily.  Marland Kitchen aspirin EC 81 MG EC tablet Take 1 tablet (81 mg total) by mouth daily.  . carvedilol (COREG) 6.25 MG tablet TAKE 1 TABLET (6.25 MG TOTAL) BY MOUTH 2 (TWO) TIMES DAILY WITH A MEAL.  Marland Kitchen Cholecalciferol (VITAMIN D) 2000 UNITS tablet Take 2,000 Units daily by mouth.   . Coenzyme Q10 (CO Q-10 PO) Take 1 capsule by mouth daily.  . Cyanocobalamin (VITAMIN B12) 500 MCG TABS Take 500 mcg by mouth daily. Take for 5 days only.  . flunisolide (NASALIDE) 25 MCG/ACT (0.025%) SOLN Place 2 sprays into the nose daily as needed (for allergies).   . losartan (COZAAR) 50 MG tablet Take 25 mg by mouth daily.   . Polyvinyl Alcohol-Povidone PF (REFRESH) 1.4-0.6 % SOLN Place 1  drop into both eyes 3 (three) times daily as needed (for dry eyes).  . rosuvastatin (CRESTOR) 5 MG tablet Take 5 mg by mouth every Monday, Wednesday, and Friday.      Allergies:   Ace inhibitors; Sulfa antibiotics; and Antihistamines, chlorpheniramine-type   Social History   Tobacco Use  . Smoking status: Never Smoker  . Smokeless tobacco: Never Used  Substance Use Topics  . Alcohol use: Yes    Comment: one drink per day (wine or beer)  . Drug use: No     Family Hx: The patient's family history includes Cancer in his father, maternal aunt, maternal uncle, mother, and paternal uncle; Heart failure in his father.  ROS:   Please see the history of present illness.     All other systems reviewed and are negative.   Labs/Other Tests and Data Reviewed:    Recent Labs: 04/23/2018: ALT 16 04/28/2018: Magnesium 2.8 05/01/2018: BUN 19; Creatinine, Ser 1.00; Hemoglobin 12.2; Platelets 127; Potassium 3.8; Sodium 133   Recent Lipid Panel No results found for: CHOL, TRIG, HDL, CHOLHDL, LDLCALC, LDLDIRECT  Wt Readings from Last 3 Encounters:  10/19/18 208 lb (94.3 kg)  08/02/18 203 lb 12.8 oz (92.4 kg)  07/13/18 206 lb 12.8 oz (93.8 kg)     Objective:    Vital Signs:  BP 120/77   Pulse 65   Ht 6' (1.829 m)   Wt 208 lb (94.3 kg)   BMI 28.21 kg/m    CONSTITUTIONAL:  Well nourished, well developed male in no acute distress.  EYES: anicteric MOUTH: oral mucosa is pink RESPIRATORY: Normal respiratory effort, symmetric expansion CARDIOVASCULAR: No peripheral edema SKIN: No rash, lesions or ulcers MUSCULOSKELETAL: no digital cyanosis NEURO: Cranial Nerves II-XII grossly intact, moves all extremities PSYCH: Intact judgement and insight.  A&O x 3, Mood/affect appropriate   ASSESSMENT & PLAN:    1.  Bacterial endocarditis/MV disease - noted by TEE 12/2017 to have partially flail PMVL segment with 4+ MR in setting of strep viridans bacteremia.  He is now s/p minimally invasive MR  repair.  2D echo pending and was postponed until July due to [place] 19 crisis.  He is doing well.  He understands he will need SBE prophylaxis.   2.  Paroxysmal atrial fibrillation - he is s/p LA clipping and MAZE procedure at the time of his MV repair. He has not had any palpitations since and no known PAF.  He will continue on Carvedilol 6.25mg  BID and Eliquis 5mg  BID.  His last creatinine was 0.96 on 07/2018. Hbg stable at 12.2 04/2018.  He has not had any bleeding problems on the Eliquis. I have  instructed him to stop his ASA.  3.  Hypertension - he occasionally checks his BP at home and it is controlled. He will continue on Carvedilol 6.25mg  BID and Losartan 50mg  daily.   4.  COVID-19 Education:The signs and symptoms of COVID-19 were discussed with the patient and how to seek care for testing (follow up with PCP or arrange E-visit).  The importance of social distancing was discussed today.  5.  Ascending aortic dilatation - 46mm by echo 02/2018 but no evidence of aneursym by CT.  Continue statin.    Patient Risk:   After full review of this patient's clinical status, I feel that they are at least moderate risk at this time.  Time:   Today, I have spent 15 minutes directly with the patient on video discussing medical problems including Mitral valve disease, HTN and PAF.  We also reviewed the symptoms of COVID 19 and the ways to protect against contracting the virus with telehealth technology.  I spent an additional 10 minutes reviewing patient's chart including 2D echo 03/2018, TEE 12/2017, cardiac cath and op note for MV repair with MAZE and LA clipping, labs for renal function.  Medication Adjustments/Labs and Tests Ordered: Current medicines are reviewed at length with the patient today.  Concerns regarding medicines are outlined above.  Tests Ordered: No orders of the defined types were placed in this encounter.  Medication Changes: No orders of the defined types were placed in this  encounter.   Disposition:  Follow up in 6 month(s)  Signed, Fransico Him, MD  10/19/2018 10:54 AM    [facility]

## 2018-10-18 NOTE — Telephone Encounter (Signed)
Pt agrees to have vital signs, weight (he's not sure if his scale works) and current medications available prior to his appt time. He does have a smart phone and understands that he will receive a text message and he is to follow the prompts and agree to the terms for his appt.  YOUR CARDIOLOGY TEAM HAS ARRANGED FOR AN E-VISIT FOR YOUR APPOINTMENT - PLEASE REVIEW IMPORTANT INFORMATION BELOW SEVERAL DAYS PRIOR TO YOUR APPOINTMENT  Due to the recent COVID-19 pandemic, we are transitioning in-person office visits to tele-medicine visits in an effort to decrease unnecessary exposure to our patients, their families, and staff. These visits are billed to your insurance just like a normal visit is. We also encourage you to sign up for MyChart if you have not already done so. You will need a smartphone if possible. For patients that do not have this, we can still complete the visit using a regular telephone but do prefer a smartphone to enable video when possible. You may have a family member that lives with you that can help. If possible, we also ask that you have a blood pressure cuff and scale at home to measure your blood pressure, heart rate and weight prior to your scheduled appointment. Patients with clinical needs that need an in-person evaluation and testing will still be able to come to the office if absolutely necessary. If you have any questions, feel free to call our office.     YOUR PROVIDER WILL BE USING THE FOLLOWING PLATFORM TO COMPLETE YOUR VISIT: YES  . IF USING WEBEX - How to Download the WebEx App to Your SmartPhone  - If Apple device, go to [employer] and type in WebEx in the search bar. [place] [employer], the blue/green circle. If Android, go to [employer] and type in [employer] in the search bar. The app is free but as with any other app download, your phone may require you to verify saved payment information or Apple/Android password.  - You do NOT have to create an  account. - On the day of the visit, our staff will walk you through joining the meeting with the meeting number/password.  Chase Smith USING MYCHART - How to Download the MyChart App to Your SmartPhone   - If Apple, go to [employer] and type in MyChart in the search bar and download the app. If Android, ask patient to go to [employer] and type in [place] in the search bar and download the app. The app is free but as with any other app downloads, your phone may require you to verify saved payment information or Apple/Android password.  - You will need to then log into the app with your MyChart username and password, and select [facility] as your healthcare provider to link the account. When it is time for your visit, go to the MyChart app, find appointments, and click Begin Video Visit. Be sure to Select Allow for your device to access the Microphone and Camera for your visit. You will then be connected, and your provider will be with you shortly.  **If you have any issues connecting or need assistance, please contact MyChart service desk [phone])**  **If using a computer, in order to ensure the best quality for your visit, you will need to use either of the following Internet Browsers: [employer], or [employer]**  . IF USING DOXIMITY or DOXY.ME - The staff will give you instructions on receiving your link to  join the meeting the day of your visit.      2-3 DAYS BEFORE YOUR APPOINTMENT  You will receive a telephone call from one of our [place] team members - your caller ID may say "Unknown caller." If this is a video visit, we will walk you through how to set up your device to be able to complete the visit. We will remind you check your blood pressure, heart rate and weight prior to your scheduled appointment. If you have an Apple Watch or Kardia, please upload any pertinent ECG strips the day before or morning of your appointment to [facility]. Our staff will also make  sure you have reviewed the consent and agree to move forward with your scheduled tele-health visit.     THE DAY OF YOUR APPOINTMENT  Approximately 15 minutes prior to your scheduled appointment, you will receive a telephone call from one of [place] team - your caller ID may say "Unknown caller."  Our staff will confirm medications, vital signs for the day and any symptoms you may be experiencing. Please have this information available prior to the time of visit start. It may also be helpful for you to have a pad of paper and pen handy for any instructions given during your visit. They will also walk you through joining the smartphone meeting if this is a video visit.    CONSENT FOR TELE-HEALTH VISIT - PLEASE REVIEW  I hereby voluntarily request, consent and authorize [facility] and its employed or contracted physicians, physician assistants, nurse practitioners or other licensed health care professionals (the Practitioner), to provide me with telemedicine health care services (the "Services") as deemed necessary by the treating Practitioner. I acknowledge and consent to receive the Services by the Practitioner via telemedicine. I understand that the telemedicine visit will involve communicating with the Practitioner through live audiovisual communication technology and the disclosure of certain medical information by electronic transmission. I acknowledge that I have been given the opportunity to request an in-person assessment or other available alternative prior to the telemedicine visit and am voluntarily participating in the telemedicine visit.  I understand that I have the right to withhold or withdraw my consent to the use of telemedicine in the course of my care at any time, without affecting my right to future care or treatment, and that the Practitioner or I may terminate the telemedicine visit at any time. I understand that I have the right to inspect all information obtained and/or  recorded in the course of the telemedicine visit and may receive copies of available information for a reasonable fee.  I understand that some of the potential risks of receiving the Services via telemedicine include:  Marland Kitchen Delay or interruption in medical evaluation due to technological equipment failure or disruption; . Information transmitted may not be sufficient (e.g. poor resolution of images) to allow for appropriate medical decision making by the Practitioner; and/or  . In rare instances, security protocols could fail, causing a breach of personal health information.  Furthermore, I acknowledge that it is my responsibility to provide information about my medical history, conditions and care that is complete and accurate to the best of my ability. I acknowledge that Practitioner's advice, recommendations, and/or decision may be based on factors not within their control, such as incomplete or inaccurate data provided by me or distortions of diagnostic images or specimens that may result from electronic transmissions. I understand that the practice of medicine is not an exact science and that Practitioner makes no warranties  or guarantees regarding treatment outcomes. I acknowledge that I will receive a copy of this consent concurrently upon execution via email to the email address I last provided but may also request a printed copy by calling the office of [facility].    I understand that my insurance will be billed for this visit.   I have read or had this consent read to me. . I understand the contents of this consent, which adequately explains the benefits and risks of the Services being provided via telemedicine.  . I have been provided ample opportunity to ask questions regarding this consent and the Services and have had my questions answered to my satisfaction. . I give my informed consent for the services to be provided through the use of telemedicine in my medical care  By participating  in this telemedicine visit I agree to the above.

## 2018-10-19 ENCOUNTER — Encounter: Payer: Self-pay | Admitting: Cardiology

## 2018-10-19 ENCOUNTER — Other Ambulatory Visit: Payer: Self-pay

## 2018-10-19 ENCOUNTER — Telehealth (INDEPENDENT_AMBULATORY_CARE_PROVIDER_SITE_OTHER): Payer: Medicare Other | Admitting: Cardiology

## 2018-10-19 ENCOUNTER — Ambulatory Visit (HOSPITAL_COMMUNITY): Payer: Medicare Other

## 2018-10-19 VITALS — BP 120/77 | HR 65 | Ht 72.0 in | Wt 208.0 lb

## 2018-10-19 DIAGNOSIS — A491 Streptococcal infection, unspecified site: Secondary | ICD-10-CM | POA: Diagnosis not present

## 2018-10-19 DIAGNOSIS — I712 Thoracic aortic aneurysm, without rupture: Secondary | ICD-10-CM | POA: Diagnosis not present

## 2018-10-19 DIAGNOSIS — I341 Nonrheumatic mitral (valve) prolapse: Secondary | ICD-10-CM

## 2018-10-19 DIAGNOSIS — I10 Essential (primary) hypertension: Secondary | ICD-10-CM | POA: Diagnosis not present

## 2018-10-19 DIAGNOSIS — Z7189 Other specified counseling: Secondary | ICD-10-CM | POA: Diagnosis not present

## 2018-10-19 DIAGNOSIS — I7121 Aneurysm of the ascending aorta, without rupture: Secondary | ICD-10-CM

## 2018-10-19 DIAGNOSIS — I340 Nonrheumatic mitral (valve) insufficiency: Secondary | ICD-10-CM

## 2018-10-19 DIAGNOSIS — I480 Paroxysmal atrial fibrillation: Secondary | ICD-10-CM

## 2018-10-19 NOTE — Patient Instructions (Signed)
Medication Instructions:  Your physician recommends that you continue on your current medications as directed. Please refer to the Current Medication list given to you today.  If you need a refill on your cardiac medications before your next appointment, please call your pharmacy.   Lab work: None If you have labs (blood work) drawn today and your tests are completely normal, you will receive your results only by: . MyChart Message (if you have MyChart) OR . A paper copy in the mail If you have any lab test that is abnormal or we need to change your treatment, we will call you to review the results.  Testing/Procedures: None  Follow-Up: At [facility], you and your health needs are our priority.  As part of our continuing mission to provide you with exceptional heart care, we have created designated Provider Care Teams.  These Care Teams include your primary Cardiologist (physician) and Advanced Practice Providers (APPs -  Physician Assistants and Nurse Practitioners) who all work together to provide you with the care you need, when you need it. You will need a follow up appointment in 6 months.  Please call our office 2 months in advance to schedule this appointment.  You may see Traci Turner, MD or one of the following Advanced Practice Providers on your designated Care Team:   Brittainy Simmons, PA-C Dayna Dunn, PA-C . Michele Lenze, PA-C     

## 2018-10-26 DIAGNOSIS — I10 Essential (primary) hypertension: Secondary | ICD-10-CM | POA: Diagnosis not present

## 2018-10-26 DIAGNOSIS — S62621B Displaced fracture of medial phalanx of left index finger, initial encounter for open fracture: Secondary | ICD-10-CM | POA: Diagnosis not present

## 2018-10-26 DIAGNOSIS — R936 Abnormal findings on diagnostic imaging of limbs: Secondary | ICD-10-CM | POA: Diagnosis not present

## 2018-10-26 DIAGNOSIS — S66123A Laceration of flexor muscle, fascia and tendon of left middle finger at wrist and hand level, initial encounter: Secondary | ICD-10-CM | POA: Diagnosis not present

## 2018-10-26 DIAGNOSIS — W312XXA Contact with powered woodworking and forming machines, initial encounter: Secondary | ICD-10-CM | POA: Diagnosis not present

## 2018-10-26 DIAGNOSIS — Z7901 Long term (current) use of anticoagulants: Secondary | ICD-10-CM | POA: Diagnosis not present

## 2018-10-26 DIAGNOSIS — S62613B Displaced fracture of proximal phalanx of left middle finger, initial encounter for open fracture: Secondary | ICD-10-CM | POA: Diagnosis not present

## 2018-10-26 DIAGNOSIS — S61211A Laceration without foreign body of left index finger without damage to nail, initial encounter: Secondary | ICD-10-CM | POA: Diagnosis not present

## 2018-10-26 DIAGNOSIS — Z952 Presence of prosthetic heart valve: Secondary | ICD-10-CM | POA: Diagnosis not present

## 2018-10-26 DIAGNOSIS — S62633B Displaced fracture of distal phalanx of left middle finger, initial encounter for open fracture: Secondary | ICD-10-CM | POA: Diagnosis not present

## 2018-10-27 ENCOUNTER — Other Ambulatory Visit: Payer: Self-pay | Admitting: Orthopedic Surgery

## 2018-10-27 ENCOUNTER — Telehealth: Payer: Self-pay

## 2018-10-27 ENCOUNTER — Other Ambulatory Visit: Payer: Self-pay

## 2018-10-27 ENCOUNTER — Encounter (HOSPITAL_BASED_OUTPATIENT_CLINIC_OR_DEPARTMENT_OTHER): Payer: Self-pay | Admitting: *Deleted

## 2018-10-27 DIAGNOSIS — S62651B Nondisplaced fracture of medial phalanx of left index finger, initial encounter for open fracture: Secondary | ICD-10-CM | POA: Diagnosis not present

## 2018-10-27 DIAGNOSIS — T148XXA Other injury of unspecified body region, initial encounter: Secondary | ICD-10-CM | POA: Diagnosis not present

## 2018-10-27 DIAGNOSIS — S6440XA Injury of digital nerve of unspecified finger, initial encounter: Secondary | ICD-10-CM | POA: Diagnosis not present

## 2018-10-27 DIAGNOSIS — S66822A Laceration of other specified muscles, fascia and tendons at wrist and hand level, left hand, initial encounter: Secondary | ICD-10-CM | POA: Diagnosis not present

## 2018-10-27 NOTE — Telephone Encounter (Signed)
Can you please comment on anticoagulation

## 2018-10-27 NOTE — Telephone Encounter (Signed)
   [facility] Pre-operative Risk Assessment    Request for surgical clearance:  1. What type of surgery is being performed? Debridement middle Phalanx fracture , repair arteries, tendons and nerves, left indy finger.   2. When is this surgery scheduled? 10/28/18   3. What type of clearance is required (medical clearance vs. Pharmacy clearance to hold med vs. Both)? Pharmacy  4. Are there any medications that need to be held prior to surgery and how long? Eliquis   5. Practice name and name of physician performing surgery? [facility]. Daryll Brod   6. What is your office phone number [phone]   7.   What is your office fax number: [phone]  8.   Anesthesia type (None, local, MAC, general) ? Axillary Block   Jacinta Shoe 10/27/2018, 4:57 PM  _________________________________________________________________   (provider comments below)

## 2018-10-28 ENCOUNTER — Encounter (HOSPITAL_BASED_OUTPATIENT_CLINIC_OR_DEPARTMENT_OTHER): Payer: Self-pay

## 2018-10-28 ENCOUNTER — Ambulatory Visit (HOSPITAL_BASED_OUTPATIENT_CLINIC_OR_DEPARTMENT_OTHER): Payer: Medicare Other | Admitting: Anesthesiology

## 2018-10-28 ENCOUNTER — Ambulatory Visit (HOSPITAL_BASED_OUTPATIENT_CLINIC_OR_DEPARTMENT_OTHER)
Admission: RE | Admit: 2018-10-28 | Discharge: 2018-10-28 | Disposition: A | Discharge status: Home / Self Care | Payer: Medicare Other | Attending: Orthopedic Surgery | Admitting: Orthopedic Surgery

## 2018-10-28 ENCOUNTER — Encounter (HOSPITAL_BASED_OUTPATIENT_CLINIC_OR_DEPARTMENT_OTHER)
Admission: RE | Disposition: A | Discharge status: Home / Self Care | Payer: Self-pay | Source: Home / Self Care | Attending: Orthopedic Surgery

## 2018-10-28 DIAGNOSIS — Z882 Allergy status to sulfonamides status: Secondary | ICD-10-CM | POA: Diagnosis not present

## 2018-10-28 DIAGNOSIS — Z79899 Other long term (current) drug therapy: Secondary | ICD-10-CM | POA: Insufficient documentation

## 2018-10-28 DIAGNOSIS — I480 Paroxysmal atrial fibrillation: Secondary | ICD-10-CM | POA: Insufficient documentation

## 2018-10-28 DIAGNOSIS — I10 Essential (primary) hypertension: Secondary | ICD-10-CM | POA: Insufficient documentation

## 2018-10-28 DIAGNOSIS — S65511A Laceration of blood vessel of left index finger, initial encounter: Secondary | ICD-10-CM | POA: Diagnosis not present

## 2018-10-28 DIAGNOSIS — W312XXA Contact with powered woodworking and forming machines, initial encounter: Secondary | ICD-10-CM | POA: Diagnosis not present

## 2018-10-28 DIAGNOSIS — S56122A Laceration of flexor muscle, fascia and tendon of left index finger at forearm level, initial encounter: Secondary | ICD-10-CM | POA: Diagnosis not present

## 2018-10-28 DIAGNOSIS — S64491A Injury of digital nerve of left index finger, initial encounter: Secondary | ICD-10-CM | POA: Insufficient documentation

## 2018-10-28 DIAGNOSIS — S61211A Laceration without foreign body of left index finger without damage to nail, initial encounter: Secondary | ICD-10-CM | POA: Diagnosis present

## 2018-10-28 DIAGNOSIS — S6402XA Injury of ulnar nerve at wrist and hand level of left arm, initial encounter: Secondary | ICD-10-CM | POA: Diagnosis not present

## 2018-10-28 DIAGNOSIS — Z888 Allergy status to other drugs, medicaments and biological substances status: Secondary | ICD-10-CM | POA: Insufficient documentation

## 2018-10-28 DIAGNOSIS — E785 Hyperlipidemia, unspecified: Secondary | ICD-10-CM | POA: Insufficient documentation

## 2018-10-28 DIAGNOSIS — G8918 Other acute postprocedural pain: Secondary | ICD-10-CM | POA: Diagnosis not present

## 2018-10-28 DIAGNOSIS — S62621B Displaced fracture of medial phalanx of left index finger, initial encounter for open fracture: Secondary | ICD-10-CM | POA: Diagnosis not present

## 2018-10-28 DIAGNOSIS — S6992XA Unspecified injury of left wrist, hand and finger(s), initial encounter: Secondary | ICD-10-CM | POA: Diagnosis not present

## 2018-10-28 DIAGNOSIS — S66321A Laceration of extensor muscle, fascia and tendon of left index finger at wrist and hand level, initial encounter: Secondary | ICD-10-CM | POA: Insufficient documentation

## 2018-10-28 DIAGNOSIS — Z7901 Long term (current) use of anticoagulants: Secondary | ICD-10-CM | POA: Diagnosis not present

## 2018-10-28 DIAGNOSIS — S65112A Laceration of radial artery at wrist and hand level of left arm, initial encounter: Secondary | ICD-10-CM | POA: Diagnosis not present

## 2018-10-28 HISTORY — DX: Laceration without foreign body of unspecified finger without damage to nail, initial encounter: S61.219A

## 2018-10-28 HISTORY — DX: Hyperlipidemia, unspecified: E78.5

## 2018-10-28 HISTORY — PX: NERVE, TENDON AND ARTERY REPAIR: SHX5695

## 2018-10-28 SURGERY — NERVE, TENDON AND ARTERY REPAIR
Anesthesia: Monitor Anesthesia Care | Site: Finger | Laterality: Left

## 2018-10-28 MED ORDER — ONDANSETRON HCL 4 MG/2ML IJ SOLN
INTRAMUSCULAR | Status: AC
  Filled 2018-10-28: qty 2

## 2018-10-28 MED ORDER — HEPARIN SODIUM (PORCINE) 1000 UNIT/ML IJ SOLN
INTRAMUSCULAR | Status: AC
  Filled 2018-10-28: qty 1

## 2018-10-28 MED ORDER — MIDAZOLAM HCL 2 MG/2ML IJ SOLN
INTRAMUSCULAR | Status: AC
  Filled 2018-10-28: qty 2

## 2018-10-28 MED ORDER — CEFAZOLIN SODIUM-DEXTROSE 2-4 GM/100ML-% IV SOLN
2.0000 g | INTRAVENOUS | Status: AC
  Administered 2018-10-28: 10:00:00 2 g via INTRAVENOUS

## 2018-10-28 MED ORDER — PROPOFOL 500 MG/50ML IV EMUL
INTRAVENOUS | Status: AC
  Filled 2018-10-28: qty 50

## 2018-10-28 MED ORDER — HYDROCODONE-ACETAMINOPHEN 5-325 MG PO TABS: 1.0000 | ORAL_TABLET | Freq: Four times a day (QID) | ORAL | 0 refills | Status: DC | PRN

## 2018-10-28 MED ORDER — PROPOFOL 500 MG/50ML IV EMUL: INTRAVENOUS | Status: DC | PRN

## 2018-10-28 MED ORDER — FENTANYL CITRATE (PF) 100 MCG/2ML IJ SOLN
INTRAMUSCULAR | Status: AC
  Filled 2018-10-28: qty 2

## 2018-10-28 MED ORDER — PROPOFOL 10 MG/ML IV BOLUS
INTRAVENOUS | Status: DC | PRN
  Administered 2018-10-28: 20 mg via INTRAVENOUS

## 2018-10-28 MED ORDER — HEPARIN SODIUM (PORCINE) 1000 UNIT/ML IJ SOLN
INTRAMUSCULAR | Status: DC | PRN
  Administered 2018-10-28: 1000 [IU] via INTRAVENOUS

## 2018-10-28 MED ORDER — LIDOCAINE 2% (20 MG/ML) 5 ML SYRINGE
INTRAMUSCULAR | Status: AC
  Filled 2018-10-28: qty 5

## 2018-10-28 MED ORDER — LIDOCAINE HCL (PF) 1 % IJ SOLN
INTRAMUSCULAR | Status: AC
  Filled 2018-10-28: qty 10

## 2018-10-28 MED ORDER — OXYCODONE HCL 5 MG/5ML PO SOLN: 5.0000 mg | Freq: Once | ORAL | Status: DC | PRN

## 2018-10-28 MED ORDER — PHENYLEPHRINE 40 MCG/ML (10ML) SYRINGE FOR IV PUSH (FOR BLOOD PRESSURE SUPPORT)
PREFILLED_SYRINGE | INTRAVENOUS | Status: AC
  Filled 2018-10-28: qty 10

## 2018-10-28 MED ORDER — EPHEDRINE SULFATE 50 MG/ML IJ SOLN
INTRAMUSCULAR | Status: DC | PRN
  Administered 2018-10-28: 10 mg via INTRAVENOUS

## 2018-10-28 MED ORDER — OXYCODONE HCL 5 MG PO TABS: 5.0000 mg | ORAL_TABLET | Freq: Once | ORAL | Status: DC | PRN

## 2018-10-28 MED ORDER — ROPIVACAINE HCL 5 MG/ML IJ SOLN
INTRAMUSCULAR | Status: DC | PRN
  Administered 2018-10-28: 30 mL via PERINEURAL

## 2018-10-28 MED ORDER — ONDANSETRON HCL 4 MG/2ML IJ SOLN
INTRAMUSCULAR | Status: DC | PRN
  Administered 2018-10-28: 4 mg via INTRAVENOUS

## 2018-10-28 MED ORDER — LIDOCAINE HCL (CARDIAC) PF 100 MG/5ML IV SOSY
PREFILLED_SYRINGE | INTRAVENOUS | Status: DC | PRN
  Administered 2018-10-28: 50 mg via INTRAVENOUS

## 2018-10-28 MED ORDER — LACTATED RINGERS IV SOLN
INTRAVENOUS | Status: AC | PRN
  Administered 2018-10-28: 500 mL via INTRAVENOUS

## 2018-10-28 MED ORDER — FENTANYL CITRATE (PF) 100 MCG/2ML IJ SOLN
50.0000 ug | INTRAMUSCULAR | Status: DC | PRN
  Administered 2018-10-28 (×2): 50 ug via INTRAVENOUS

## 2018-10-28 MED ORDER — LIDOCAINE HCL (PF) 1 % IJ SOLN
INTRAMUSCULAR | Status: DC | PRN
  Administered 2018-10-28: 10 mL

## 2018-10-28 MED ORDER — CHLORHEXIDINE GLUCONATE 4 % EX LIQD: 60.0000 mL | Freq: Once | CUTANEOUS | Status: DC

## 2018-10-28 MED ORDER — MIDAZOLAM HCL 2 MG/2ML IJ SOLN
1.0000 mg | INTRAMUSCULAR | Status: DC | PRN
  Administered 2018-10-28 (×2): 1 mg via INTRAVENOUS

## 2018-10-28 MED ORDER — SCOPOLAMINE 1 MG/3DAYS TD PT72: 1.0000 | MEDICATED_PATCH | Freq: Once | TRANSDERMAL | Status: DC | PRN

## 2018-10-28 MED ORDER — SUCCINYLCHOLINE CHLORIDE 200 MG/10ML IV SOSY
PREFILLED_SYRINGE | INTRAVENOUS | Status: AC
  Filled 2018-10-28: qty 10

## 2018-10-28 MED ORDER — PROPOFOL 500 MG/50ML IV EMUL
INTRAVENOUS | Status: DC | PRN
  Administered 2018-10-28: 100 ug/kg/min via INTRAVENOUS

## 2018-10-28 MED ORDER — LACTATED RINGERS IV SOLN
INTRAVENOUS | Status: DC
  Administered 2018-10-28: 09:00:00 via INTRAVENOUS

## 2018-10-28 MED ORDER — FENTANYL CITRATE (PF) 100 MCG/2ML IJ SOLN: 25.0000 ug | INTRAMUSCULAR | Status: DC | PRN

## 2018-10-28 MED ORDER — ONDANSETRON HCL 4 MG/2ML IJ SOLN: 4.0000 mg | Freq: Once | INTRAMUSCULAR | Status: DC | PRN

## 2018-10-28 MED ORDER — EPHEDRINE 5 MG/ML INJ
INTRAVENOUS | Status: AC
  Filled 2018-10-28: qty 10

## 2018-10-28 MED ORDER — CEFAZOLIN SODIUM-DEXTROSE 2-4 GM/100ML-% IV SOLN
INTRAVENOUS | Status: AC
  Filled 2018-10-28: qty 100

## 2018-10-28 SURGICAL SUPPLY — 78 items
APL PRP STRL LF DISP 70% ISPRP (MISCELLANEOUS) ×2
BAG DECANTER FOR FLEXI CONT (MISCELLANEOUS) ×3 IMPLANT
BALL CTTN LRG ABS STRL LF (GAUZE/BANDAGES/DRESSINGS)
BLADE MINI RND TIP GREEN BEAV (BLADE) IMPLANT
BLADE SURG 15 STRL LF DISP TIS (BLADE) ×2 IMPLANT
BLADE SURG 15 STRL SS (BLADE) ×4
BNDG CMPR 9X4 STRL LF SNTH (GAUZE/BANDAGES/DRESSINGS)
BNDG COHESIVE 3X5 TAN STRL LF (GAUZE/BANDAGES/DRESSINGS) ×4 IMPLANT
BNDG ESMARK 4X9 LF (GAUZE/BANDAGES/DRESSINGS) IMPLANT
BNDG GAUZE ELAST 4 BULKY (GAUZE/BANDAGES/DRESSINGS) ×4 IMPLANT
CHLORAPREP W/TINT 26 (MISCELLANEOUS) ×4 IMPLANT
CORD BIPOLAR FORCEPS 12FT (ELECTRODE) ×4 IMPLANT
COTTONBALL LRG STERILE PKG (GAUZE/BANDAGES/DRESSINGS) IMPLANT
COVER BACK TABLE REUSABLE LG (DRAPES) ×4 IMPLANT
COVER MAYO STAND REUSABLE (DRAPES) ×4 IMPLANT
COVER WAND RF STERILE (DRAPES) IMPLANT
CUFF TOURN SGL QUICK 18X4 (TOURNIQUET CUFF) ×3 IMPLANT
DECANTER SPIKE VIAL GLASS SM (MISCELLANEOUS) IMPLANT
DRAIN TLS ROUND 10FR (DRAIN) IMPLANT
DRAPE EXTREMITY T 121X128X90 (DISPOSABLE) ×4 IMPLANT
DRAPE OEC MINIVIEW 54X84 (DRAPES) IMPLANT
DRAPE SURG 17X23 STRL (DRAPES) ×4 IMPLANT
GAUZE 4X4 16PLY RFD (DISPOSABLE) IMPLANT
GAUZE SPONGE 4X4 12PLY STRL (GAUZE/BANDAGES/DRESSINGS) ×4 IMPLANT
GAUZE XEROFORM 1X8 LF (GAUZE/BANDAGES/DRESSINGS) ×4 IMPLANT
GLOVE BIOGEL PI IND STRL 8.5 (GLOVE) ×2 IMPLANT
GLOVE BIOGEL PI INDICATOR 8.5 (GLOVE) ×2
GLOVE SURG ORTHO 8.0 STRL STRW (GLOVE) ×4 IMPLANT
GOWN STRL REUS W/ TWL LRG LVL3 (GOWN DISPOSABLE) ×2 IMPLANT
GOWN STRL REUS W/TWL LRG LVL3 (GOWN DISPOSABLE) ×4
GOWN STRL REUS W/TWL XL LVL3 (GOWN DISPOSABLE) ×4 IMPLANT
K-WIRE .035X4 (WIRE) IMPLANT
LOOP VESSEL MAXI BLUE (MISCELLANEOUS) IMPLANT
NDL KEITH (NEEDLE) IMPLANT
NDL PRECISIONGLIDE 27X1.5 (NEEDLE) IMPLANT
NEEDLE HYPO 22GX1.5 SAFETY (NEEDLE) IMPLANT
NEEDLE KEITH (NEEDLE) IMPLANT
NEEDLE PRECISIONGLIDE 27X1.5 (NEEDLE) IMPLANT
NS IRRIG 1000ML POUR BTL (IV SOLUTION) ×4 IMPLANT
PACK BASIN DAY SURGERY FS (CUSTOM PROCEDURE TRAY) ×4 IMPLANT
PAD CAST 3X4 CTTN HI CHSV (CAST SUPPLIES) ×2 IMPLANT
PADDING CAST ABS 3INX4YD NS (CAST SUPPLIES) ×2
PADDING CAST ABS 4INX4YD NS (CAST SUPPLIES) ×2
PADDING CAST ABS COTTON 3X4 (CAST SUPPLIES) ×1 IMPLANT
PADDING CAST ABS COTTON 4X4 ST (CAST SUPPLIES) ×2 IMPLANT
PADDING CAST COTTON 3X4 STRL (CAST SUPPLIES) ×4
SLEEVE SCD COMPRESS KNEE MED (MISCELLANEOUS) IMPLANT
SLING ARM FOAM STRAP LRG (SOFTGOODS) ×3 IMPLANT
SPLINT PLASTER CAST XFAST 3X15 (CAST SUPPLIES) ×1 IMPLANT
SPLINT PLASTER XTRA FASTSET 3X (CAST SUPPLIES) ×2
STOCKINETTE 4X48 STRL (DRAPES) ×4 IMPLANT
SUT CHROMIC 5 0 P 3 (SUTURE) IMPLANT
SUT ETHIBOND 3-0 V-5 (SUTURE) IMPLANT
SUT ETHILON 4 0 PS 2 18 (SUTURE) ×7 IMPLANT
SUT FIBERWIRE 2-0 18 17.9 3/8 (SUTURE)
SUT FIBERWIRE 4-0 18 TAPR NDL (SUTURE)
SUT MERSILENE 2.0 SH NDLE (SUTURE) IMPLANT
SUT MERSILENE 4 0 P 3 (SUTURE) IMPLANT
SUT NYLON 9 0 VRM6 (SUTURE) ×6 IMPLANT
SUT PROLENE 2 0 SH DA (SUTURE) IMPLANT
SUT SILK 2 0 PERMA HAND 18 BK (SUTURE) IMPLANT
SUT SILK 4 0 PS 2 (SUTURE) IMPLANT
SUT STEEL 3 0 (SUTURE) IMPLANT
SUT SUPRAMID 4-0 (SUTURE) ×3 IMPLANT
SUT VIC AB 3-0 PS1 18 (SUTURE)
SUT VIC AB 3-0 PS1 18XBRD (SUTURE) IMPLANT
SUT VIC AB 4-0 P-3 18XBRD (SUTURE) IMPLANT
SUT VIC AB 4-0 P3 18 (SUTURE)
SUT VICRYL 4-0 PS2 18IN ABS (SUTURE) IMPLANT
SUTURE FIBERWR 2-0 18 17.9 3/8 (SUTURE) IMPLANT
SUTURE FIBERWR 4-0 18 TAPR NDL (SUTURE) IMPLANT
SWAB COLLECTION DEVICE MRSA (MISCELLANEOUS) ×3 IMPLANT
SWAB CULTURE ESWAB REG 1ML (MISCELLANEOUS) ×3 IMPLANT
SYR BULB 3OZ (MISCELLANEOUS) ×4 IMPLANT
SYR CONTROL 10ML LL (SYRINGE) IMPLANT
TOWEL GREEN STERILE FF (TOWEL DISPOSABLE) ×8 IMPLANT
TUBE FEEDING ENTERAL 5FR 16IN (TUBING) IMPLANT
UNDERPAD 30X30 (UNDERPADS AND DIAPERS) ×1 IMPLANT

## 2018-10-28 NOTE — Progress Notes (Signed)
Assisted Dr. Witman with left, ultrasound guided, supraclavicular block. Side rails up, monitors on throughout procedure. See vital signs in flow sheet. Tolerated Procedure well. °

## 2018-10-28 NOTE — Op Note (Signed)
I assisted Surgeon(s) and Role:    * Daryll Brod, MD - Primary    * Leanora Cover, MD - Assisting on the Procedure(s): TENDON, ARTERY, NERVE REPAIR LEFT INDEX FINGER, DEBRIDEMENT OF WOUND on 10/28/2018.  I provided assistance on this case as follows: retraction soft tissues, identification structures, positioning of digit, microdissection under microscope, repair under microscope.  Electronically signed by: Leanora Cover, MD Date: 10/28/2018 Time: 12:11 PM

## 2018-10-28 NOTE — Transfer of Care (Signed)
Immediate Anesthesia Transfer of Care Note  Patient: Chase Smith.  Procedure(s) Performed: TENDON, ARTERY, NERVE REPAIR LEFT INDEX FINGER, DEBRIDEMENT OF WOUND (Left Finger)  Patient Location: PACU  Anesthesia Type:MAC combined with regional for post-op pain  Level of Consciousness: sedated and responds to stimulation  Airway & Oxygen Therapy: Patient Spontanous Breathing and Patient connected to nasal cannula oxygen  Post-op Assessment: Report given to RN and Post -op Vital signs reviewed and stable  Post vital signs: Reviewed and stable  Last Vitals:  Vitals Value Taken Time  BP 135/74 10/28/2018 12:15 PM  Temp    Pulse 56 10/28/2018 12:16 PM  Resp 12 10/28/2018 12:16 PM  SpO2 98 % 10/28/2018 12:16 PM  Vitals shown include unvalidated device data.  Last Pain:  Vitals:   10/28/18 0842  TempSrc: Oral  PainSc: 2       Patients Stated Pain Goal: 5 (04/75/33 9179)  Complications: No apparent anesthesia complications

## 2018-10-28 NOTE — H&P (Signed)
Chase Smith. is an 73 y.o. male.   Chief Complaint: saw injury left index finger HPI: Chase Smith is a 73 year old right-hand-dominant male referred from [facility] in [place] for consultation regarding a injury sustained with a saw last evening. This was his left index finger. This was sutured closed he had loss of sensation and loss of flexion of the distal phalangeal joint. Was irrigated debrided he was given a shot of Ancef. He has been referred and that he lives in this area. He has no prior history of injury. He has no history of diabetes thyroid problems or gout. He has a history of arthritis. Family history is negative for each of these. He is complaining of numbness and tingling to the tip of the finger.   Past Medical History:  Diagnosis Date  . Abdominal mass, RLQ (right lower quadrant) 07/27/2012  . Ascending aortic aneurysm (HCC)    40mm by echo 02/2018 but normal by Chest CTA  . Benign essential HTN 02/07/2015  . BPH (benign prostatic hyperplasia)    mild  . ED (erectile dysfunction) 08/2011   cancelled f/u with Urology  . Epididymitis    recurrent  . Finger laceration    left index finger  . Hyperlipidemia   . Hypertension    in the past - resolved  . Left ventricular diastolic dysfunction, NYHA class 2   . Meckel's diverticulitis 08/27/2012  . MVP (mitral valve prolapse)    Bileaflet MVP with partially flail segment of the posterior leaflt with 4+ MR in setting of strep viridans bacteremia s/p minimally invasive MV repari  . PAF (paroxysmal atrial fibrillation) ([place]) 05/13/2017   CHADS2VASC score is 2 (HTN and age > 2) on Apixaban  . S/P minimally invasive maze operation for atrial fibrillation 04/27/2018   Complete bilateral atrial lesion set using cryothermy and bipolar radiofrequency ablation with clipping of LA appendage via right mini thoracotomy approach  . S/P minimally invasive mitral valve repair 04/27/2018   Complex valvuloplasty including triangular  resection of P2 segment of posterior leaflet, artificial Gore-tex neochord placement x8 and Sorin Memo 4D ring annulopasty via right mini thoracotomy approach  . Vertigo    episodic mild positional    Past Surgical History:  Procedure Laterality Date  . APPENDECTOMY    . CARDIAC CATHETERIZATION  03/24/2018  . CLIPPING OF ATRIAL APPENDAGE  04/27/2018   Procedure: CLIPPING OF ATRIAL APPENDAGE;  Surgeon: Rexene Alberts, MD;  Location: [facility];  Service: Open Heart Surgery;;  . LAPAROSCOPIC APPENDECTOMY N/A 08/11/2012   Procedure: APPENDECTOMY LAPAROSCOPIC;  Surgeon: Stark Klein, MD;  Location: [facility];  Service: General;  Laterality: N/A;  . LAPAROSCOPIC SMALL BOWEL RESECTION N/A 08/11/2012   Procedure: Diagnostic Laparoscopy,  Diverticulectomy and laparoscopic appendectomy;  Surgeon: Stark Klein, MD;  Location: [facility];  Service: General;  Laterality: N/A;  Diagnostic Laparoscopy, Small Bowel Resection vs Diverticulectomy   . MINIMALLY INVASIVE MAZE PROCEDURE N/A 04/27/2018   Procedure: MINIMALLY INVASIVE MAZE PROCEDURE;  Surgeon: Rexene Alberts, MD;  Location: [facility];  Service: Open Heart Surgery;  Laterality: N/A;  . MITRAL VALVE REPAIR Right 04/27/2018   Procedure: MINIMALLY INVASIVE MITRAL VALVE REPAIR (MVR);  Surgeon: Rexene Alberts, MD;  Location: [facility];  Service: Open Heart Surgery;  Laterality: Right;  . PILONIDAL CYST EXCISION    . RECONSTRUCTION OF NOSE     secondary to deviated septum  . RIGHT/LEFT HEART CATH AND CORONARY ANGIOGRAPHY N/A 03/24/2018   Procedure: RIGHT/LEFT HEART CATH AND  CORONARY ANGIOGRAPHY;  Surgeon: Jettie Booze, MD;  Location: [facility];  Service: Cardiovascular;  Laterality: N/A;  . TEE WITHOUT CARDIOVERSION N/A 08/08/2013   Procedure: TRANSESOPHAGEAL ECHOCARDIOGRAM (TEE);  Surgeon: Sueanne Margarita, MD;  Location: [facility];  Service: Cardiovascular;  Laterality: N/A;  . TEE WITHOUT CARDIOVERSION N/A 01/05/2018   Procedure: TRANSESOPHAGEAL  ECHOCARDIOGRAM (TEE);  Surgeon: Lelon Perla, MD;  Location: [facility] ENDOSCOPY;  Service: Cardiovascular;  Laterality: N/A;  . TEE WITHOUT CARDIOVERSION N/A 04/27/2018   Procedure: TRANSESOPHAGEAL ECHOCARDIOGRAM (TEE);  Surgeon: Rexene Alberts, MD;  Location: [facility];  Service: Open Heart Surgery;  Laterality: N/A;  . TONSILLECTOMY      Family History  Problem Relation Age of Onset  . Cancer Mother        breast  . Cancer Father        pancreatic  . Heart failure Father   . Cancer Maternal Aunt        ? ovarian  . Cancer Maternal Uncle        ? pancreatic  . Cancer Paternal Uncle        ? lung   Social History:  reports that he has never smoked. He has never used smokeless tobacco. He reports current alcohol use. He reports that he does not use drugs.  Allergies:  Allergies  Allergen Reactions  . Ace Inhibitors Cough  . Sulfa Antibiotics Other (See Comments)    Severe lethargy  . Antihistamines, Chlorpheniramine-Type Other (See Comments)    Trouble urinating. Patient st "all antihistamines" cause this.     Medications Prior to Admission  Medication Sig Dispense Refill  . acetaminophen (TYLENOL) 500 MG tablet Take 500-1,000 mg by mouth every 6 (six) hours as needed for moderate pain or headache.    Marland Kitchen apixaban (ELIQUIS) 5 MG TABS tablet Take 1 tablet (5 mg total) by mouth 2 (two) times daily. 60 tablet   . carvedilol (COREG) 6.25 MG tablet TAKE 1 TABLET (6.25 MG TOTAL) BY MOUTH 2 (TWO) TIMES DAILY WITH A MEAL. 180 tablet 3  . Cholecalciferol (VITAMIN D) 2000 UNITS tablet Take 2,000 Units daily by mouth.     . Coenzyme Q10 (CO Q-10 PO) Take 1 capsule by mouth daily.    . Cyanocobalamin (VITAMIN B12) 500 MCG TABS Take 500 mcg by mouth daily. Take for 5 days only.    . flunisolide (NASALIDE) 25 MCG/ACT (0.025%) SOLN Place 2 sprays into the nose daily as needed (for allergies).     . losartan (COZAAR) 50 MG tablet Take 25 mg by mouth daily.     Marland Kitchen oxyCODONE-acetaminophen  (PERCOCET/ROXICET) 5-325 MG tablet Take 1 tablet by mouth every 4 (four) hours as needed for severe pain.    . rosuvastatin (CRESTOR) 5 MG tablet Take 5 mg by mouth every Monday, Wednesday, and Friday.   3  . amoxicillin (AMOXIL) 500 MG capsule TAKE FOUR CAPSULES BY MOUTH 30-60 minutes prior TO dental procedures 4 capsule 1  . Polyvinyl Alcohol-Povidone PF (REFRESH) 1.4-0.6 % SOLN Place 1 drop into both eyes 3 (three) times daily as needed (for dry eyes).      No results found for this or any previous visit (from the past 48 hour(s)).  No results found.   Pertinent items are noted in HPI.  Height 6' (1.829 m), weight 94.3 kg.  General appearance: alert, cooperative and appears stated age Head: Normocephalic, without obvious abnormality Neck: no JVD Resp: clear to auscultation bilaterally Cardio: regularly irregular  rhythm GI: soft, non-tender; bowel sounds normal; no masses,  no organomegaly Extremities: lacerations left index finger Pulses: 2+ and symmetric Skin: Skin color, texture, turgor normal. No rashes or lesions Neurologic: Grossly normal Incision/Wound: closed  Assessment/Plan Assessment:  1. Contact with powered saw as cause of accidental injury  2. Open nondisplaced fracture of middle phalanx of left index finger  3. Laceration of artery  4. Laceration of digital nerve of finger  5. Laceration of flexor tendon of left hand  Plan: We have discussed exploration repair of damaged structures with him. His notes from [facility] are reviewed. Is aware there is no guarantee to the surgery the possibility of infection recurrence injury to arteries nerves tendons complete relief symptoms dystrophy is well advised that he may not have a normal sensation or function to the distal phalangeal joint and may result in bypassing this but we would recommend exploration repair and he would like to proceed. This be scheduled as an outpatient under regional anesthesia with possible grafts or  conduits. He will check with Dr. Inda Merlin regarding his Eliquis   Chase Smith 10/28/2018, 8:13 AM

## 2018-10-28 NOTE — Telephone Encounter (Signed)
Pt already admitted for procedure today.

## 2018-10-28 NOTE — Anesthesia Postprocedure Evaluation (Signed)
Anesthesia Post Note  Patient: Chase Smith.  Procedure(s) Performed: TENDON, ARTERY, NERVE REPAIR LEFT INDEX FINGER, DEBRIDEMENT OF WOUND (Left Finger)     Patient location during evaluation: PACU Anesthesia Type: MAC Level of consciousness: awake and alert Pain management: pain level controlled Vital Signs Assessment: post-procedure vital signs reviewed and stable Respiratory status: spontaneous breathing, nonlabored ventilation and respiratory function stable Cardiovascular status: blood pressure returned to baseline and stable Postop Assessment: no apparent nausea or vomiting Anesthetic complications: no    Last Vitals:  Vitals:   10/28/18 1230 10/28/18 1245  BP: 140/83 (!) 150/78  Pulse: (!) 52 (!) 55  Resp: 16 10  Temp:    SpO2: 99% 98%    Last Pain:  Vitals:   10/28/18 1245  TempSrc:   PainSc: 0-No pain                 Lidia Collum

## 2018-10-28 NOTE — Anesthesia Procedure Notes (Signed)
Anesthesia Regional Block: Supraclavicular block   Pre-Anesthetic Checklist: ,, timeout performed, Correct Patient, Correct Site, Correct Laterality, Correct Procedure, Correct Position, site marked, Risks and benefits discussed,  Surgical consent,  Pre-op evaluation,  At surgeon's request and post-op pain management  Laterality: Left  Prep: chloraprep       Needles:  Injection technique: Single-shot  Needle Type: Echogenic Stimulator Needle     Needle Length: 9cm  Needle Gauge: 21     Additional Needles:   Procedures:,,,, ultrasound used (permanent image in chart),,,,  Narrative:  Start time: 10/28/2018 9:35 AM End time: 10/28/2018 9:38 AM Injection made incrementally with aspirations every 5 mL.  Performed by: Personally  Anesthesiologist: Lidia Collum, MD  Additional Notes: Monitors applied. Injection made in 5cc increments. No resistance to injection. Good needle visualization. Patient tolerated procedure well.

## 2018-10-28 NOTE — Anesthesia Preprocedure Evaluation (Signed)
Anesthesia Evaluation  Patient identified by MRN, date of birth, ID band Patient awake    Reviewed: Allergy & Precautions, H&P , NPO status , Patient's Chart, lab work & pertinent test results, reviewed documented beta blocker date and time   History of Anesthesia Complications Negative for: history of anesthetic complications  Airway Mallampati: II  TM Distance: >3 FB Neck ROM: Full    Dental no notable dental hx. (+) Teeth Intact, Dental Advisory Given   Pulmonary neg pulmonary ROS,    Pulmonary exam normal breath sounds clear to auscultation       Cardiovascular Exercise Tolerance: Good hypertension, Pt. on medications and Pt. on home beta blockers + dysrhythmias Atrial Fibrillation + Valvular Problems/Murmurs (s/p MV repair) MR  Rhythm:Regular Rate:Normal     Neuro/Psych negative neurological ROS  negative psych ROS   GI/Hepatic negative GI ROS, Neg liver ROS,   Endo/Other  negative endocrine ROS  Renal/GU negative Renal ROS  negative genitourinary   Musculoskeletal negative musculoskeletal ROS (+)   Abdominal   Peds  Hematology negative hematology ROS (+)   Anesthesia Other Findings   Reproductive/Obstetrics negative OB ROS                             Anesthesia Physical  Anesthesia Plan  ASA: III  Anesthesia Plan: MAC   Post-op Pain Management:  Regional for Post-op pain   Induction: Intravenous  PONV Risk Score and Plan: 1 and Treatment may vary due to age or medical condition and Propofol infusion  Airway Management Planned: Natural Airway and Simple Face Mask  Additional Equipment:   Intra-op Plan:   Post-operative Plan:   Informed Consent: I have reviewed the patients History and Physical, chart, labs and discussed the procedure including the risks, benefits and alternatives for the proposed anesthesia with the patient or authorized representative who has  indicated his/her understanding and acceptance.       Plan Discussed with:   Anesthesia Plan Comments:         Anesthesia Quick Evaluation

## 2018-10-28 NOTE — Op Note (Addendum)
NAME: Chase Smith. MEDICAL RECORD NO: [id number] DATE OF BIRTH: 07-05-1945 FACILITY: [facility] LOCATION: [facility] PHYSICIAN: Wynonia Sours, MD   OPERATIVE REPORT   DATE OF PROCEDURE: 10/28/18    PREOPERATIVE DIAGNOSIS:   Saw injury segmental left index finger with laceration arteries nerves and tendons open fracture middle phalanx   POSTOPERATIVE DIAGNOSIS:   Same   PROCEDURE:   Debridement of skin open fracture repair of superficialis and profundus tendons with repair of radial digital artery and nerve plus ulnar digital artery and nerve left index finger   SURGEON: Daryll Brod, M.D.   ASSISTANT: Leanora Cover, MD   ANESTHESIA:  Regional with sedation   INTRAVENOUS FLUIDS:  Per anesthesia flow sheet.   ESTIMATED BLOOD LOSS:  Minimal.   COMPLICATIONS:  None.   SPECIMENS:   Cultures   TOURNIQUET TIME:    Total Tourniquet Time Documented: Upper Arm (Left) - 78 minutes Total: Upper Arm (Left) - 78 minutes    DISPOSITION:  Stable to PACU.   INDICATIONS: Patient is a 73 year old male who sustained a saw injury to the middle phalanx transversely of his left index finger and a second wound at the distal inner phalangeal joint of his index finger with loss of sensation and an inability to flex the fingers at either joint.  He was seen elsewhere where the wound was closed after irrigation and referred.  He is aware that there is no guarantee to the surgery the possibility of infection stiffness impaired sensibility the fact that he may not regain motion especially at the distal phalangeal joint possibility of bypassing it and having a non-useful finger but would like to proceed with attempted repairs.  Preoperative area the patient is seen extremity marked by both patient and surgeon antibiotic given  OPERATIVE COURSE: The patient is brought to the operating room after supraclavicular block was carried out without difficulty in the preoperative area.  He was  prepped using ChloraPrep in a supine position with the left arm free.  The limb was exsanguinated with an Esmarch bandage after mapping out the possibility of vein grafts and nerve graft.  The sutures were removed from the 2 wound and a culture taken.  The distal wound was explored the neurovascular bundle was found to be intact the wound was debrided of skin and soft tissue and closed interrupted 4-0 nylon sutures after a culture was taken skin and subcutaneous tissue were then debrided with a knife and sharp dissection and the proximal wound extended proximally and distally using a Bruner type on the proximal phalanx mid lateral on the middle phalanx.  This allowed visualization of the laceration of the flexor tendons both artery and nerve both on the radial and ulnar aspects were transected.  The fracture was then debrided with a house curette and irrigated after the cultures were taken.  The profundus tendon was then repaired with a modified Kessler using a 4-0 Supramid the one limb of the superficialis was also repaired with the Supramid using a modified Kessler.  The operative microscope was then brought into the field.  The ends of both nerve and artery on the radial side were debrided back to normal tissue.  These were able to be repaired the artery with the back wall first technique using a 9-0 nylon sutures.  The nerve was then repaired with an epineural repair.  The ulnar digital artery and nerves were then isolated these were each repaired with the back wall first technique and  9-0 nylon sutures were on the artery and an epineural repair on the nerve.  Significant injuries were present to both nerves.  Wound was again irrigated.  The skin was then closed interrupted 4 nylon sutures.  Thorough compressive dressing was applied and the tourniquet deflated.  The finger pad pinked up a dorsal splint was applied and he was taken to the recovery room for observation in satisfactory condition.  Will be  discharged home to return to the [facility] [place] in 1 week.  He is presently on Eliquis and this is maintained.  He will be discharged on Norco.  Daryll Brod, MD Electronically signed, 10/28/18

## 2018-10-28 NOTE — Brief Op Note (Signed)
10/28/2018  12:10 PM  PATIENT:  [name]  73 y.o. male  PRE-OPERATIVE DIAGNOSIS:  FALL INJURY LEFT INDEX FINGER  POST-OPERATIVE DIAGNOSIS:  FALL INJURY LEFT INDEX FINGER  PROCEDURE:  Procedure(s): TENDON, ARTERY, NERVE REPAIR LEFT INDEX FINGER, DEBRIDEMENT OF WOUND (Left)  SURGEON:  Surgeon(s) and Role:    * Daryll Brod, MD - Primary    * Leanora Cover, MD - Assisting  PHYSICIAN ASSISTANT:   ASSISTANTS: K Elayah Klooster,MD   ANESTHESIA:   regional and IV sedation  EBL:  10 mL   BLOOD ADMINISTERED:none  DRAINS: none   LOCAL MEDICATIONS USED:  NONE  SPECIMEN:  No Specimen  DISPOSITION OF SPECIMEN:  N/A  COUNTS:  YES  TOURNIQUET:   Total Tourniquet Time Documented: Upper Arm (Left) - 78 minutes Total: Upper Arm (Left) - 78 minutes   DICTATION: .Viviann Spare Dictation  PLAN OF CARE: Discharge to home after PACU  PATIENT DISPOSITION:  PACU - hemodynamically stable.

## 2018-10-28 NOTE — Discharge Instructions (Addendum)
[facility] Instructions Hand Surgery  Wound Care: Keep your hand elevated above the level of your heart.  Do not allow it to dangle by your side.  Keep the dressing dry and do not remove it unless your doctor advises you to do so.  He will usually change it at the time of your post-op visit.  Moving your fingers is advised to stimulate circulation but will depend on the site of your surgery.  If you have a splint applied, your doctor will advise you regarding movement.  Activity: Do not drive or operate machinery today.  Rest today and then you may return to your normal activity and work as indicated by your physician.  Diet:  Drink liquids today or eat a light diet.  You may resume a regular diet tomorrow.    General expectations: Pain for two to three days. Fingers may become slightly swollen.  Call your doctor if any of the following occur: Severe pain not relieved by pain medication. Elevated temperature. Dressing soaked with blood. Inability to move fingers. White or bluish color to fingers.   Post Anesthesia Home Care Instructions  Activity: Get plenty of rest for the remainder of the day. A responsible individual must stay with you for 24 hours following the procedure.  For the next 24 hours, DO NOT: -Drive a car -Paediatric nurse -Drink alcoholic beverages -Take any medication unless instructed by your physician -Make any legal decisions or sign important papers.  Meals: Start with liquid foods such as gelatin or soup. Progress to regular foods as tolerated. Avoid greasy, spicy, heavy foods. If nausea and/or vomiting occur, drink only clear liquids until the nausea and/or vomiting subsides. Call your physician if vomiting continues.  Special Instructions/Symptoms: Your throat may feel dry or sore from the anesthesia or the breathing tube placed in your throat during surgery. If this causes discomfort, gargle with warm salt water. The discomfort should disappear within  24 hours.  Regional Anesthesia Blocks  1. Numbness or the inability to move the "blocked" extremity may last from 3-48 hours after placement. The length of time depends on the medication injected and your individual response to the medication. If the numbness is not going away after 48 hours, call your surgeon.  2. The extremity that is blocked will need to be protected until the numbness is gone and the  Strength has returned. Because you cannot feel it, you will need to take extra care to avoid injury. Because it may be weak, you may have difficulty moving it or using it. You may not know what position it is in without looking at it while the block is in effect.  3. For blocks in the legs and feet, returning to weight bearing and walking needs to be done carefully. You will need to wait until the numbness is entirely gone and the strength has returned. You should be able to move your leg and foot normally before you try and bear weight or walk. You will need someone to be with you when you first try to ensure you do not fall and possibly risk injury.  4. Bruising and tenderness at the needle site are common side effects and will resolve in a few days.  5. Persistent numbness or new problems with movement should be communicated to the surgeon or the [place] [phone]).

## 2018-10-29 ENCOUNTER — Encounter (HOSPITAL_BASED_OUTPATIENT_CLINIC_OR_DEPARTMENT_OTHER): Payer: Self-pay | Admitting: Orthopedic Surgery

## 2018-11-02 LAB — AEROBIC/ANAEROBIC CULTURE (SURGICAL/DEEP WOUND): Culture: NO GROWTH

## 2018-11-02 LAB — AEROBIC/ANAEROBIC CULTURE W GRAM STAIN (SURGICAL/DEEP WOUND)

## 2018-11-03 DIAGNOSIS — S66822D Laceration of other specified muscles, fascia and tendons at wrist and hand level, left hand, subsequent encounter: Secondary | ICD-10-CM | POA: Diagnosis not present

## 2018-11-03 DIAGNOSIS — W312XXA Contact with powered woodworking and forming machines, initial encounter: Secondary | ICD-10-CM | POA: Diagnosis not present

## 2018-11-03 DIAGNOSIS — M79645 Pain in left finger(s): Secondary | ICD-10-CM | POA: Diagnosis not present

## 2018-11-03 DIAGNOSIS — M25642 Stiffness of left hand, not elsewhere classified: Secondary | ICD-10-CM | POA: Diagnosis not present

## 2018-11-03 DIAGNOSIS — S6440XD Injury of digital nerve of unspecified finger, subsequent encounter: Secondary | ICD-10-CM | POA: Diagnosis not present

## 2018-11-03 DIAGNOSIS — S62651D Nondisplaced fracture of medial phalanx of left index finger, subsequent encounter for fracture with routine healing: Secondary | ICD-10-CM | POA: Diagnosis not present

## 2018-11-03 DIAGNOSIS — T148XXA Other injury of unspecified body region, initial encounter: Secondary | ICD-10-CM | POA: Diagnosis not present

## 2018-11-08 ENCOUNTER — Encounter (HOSPITAL_BASED_OUTPATIENT_CLINIC_OR_DEPARTMENT_OTHER): Payer: Self-pay | Admitting: Orthopedic Surgery

## 2018-11-10 DIAGNOSIS — M79645 Pain in left finger(s): Secondary | ICD-10-CM | POA: Diagnosis not present

## 2018-11-10 DIAGNOSIS — S6440XD Injury of digital nerve of unspecified finger, subsequent encounter: Secondary | ICD-10-CM | POA: Diagnosis not present

## 2018-11-10 DIAGNOSIS — M25642 Stiffness of left hand, not elsewhere classified: Secondary | ICD-10-CM | POA: Diagnosis not present

## 2018-11-10 DIAGNOSIS — S62651D Nondisplaced fracture of medial phalanx of left index finger, subsequent encounter for fracture with routine healing: Secondary | ICD-10-CM | POA: Diagnosis not present

## 2018-11-10 DIAGNOSIS — T148XXA Other injury of unspecified body region, initial encounter: Secondary | ICD-10-CM | POA: Diagnosis not present

## 2018-11-10 DIAGNOSIS — S66822D Laceration of other specified muscles, fascia and tendons at wrist and hand level, left hand, subsequent encounter: Secondary | ICD-10-CM | POA: Diagnosis not present

## 2018-11-24 DIAGNOSIS — T148XXA Other injury of unspecified body region, initial encounter: Secondary | ICD-10-CM | POA: Diagnosis not present

## 2018-11-24 DIAGNOSIS — S66822D Laceration of other specified muscles, fascia and tendons at wrist and hand level, left hand, subsequent encounter: Secondary | ICD-10-CM | POA: Diagnosis not present

## 2018-11-24 DIAGNOSIS — S6440XD Injury of digital nerve of unspecified finger, subsequent encounter: Secondary | ICD-10-CM | POA: Diagnosis not present

## 2018-11-24 DIAGNOSIS — S62651D Nondisplaced fracture of medial phalanx of left index finger, subsequent encounter for fracture with routine healing: Secondary | ICD-10-CM | POA: Diagnosis not present

## 2018-11-24 DIAGNOSIS — M25642 Stiffness of left hand, not elsewhere classified: Secondary | ICD-10-CM | POA: Diagnosis not present

## 2018-11-24 DIAGNOSIS — M79645 Pain in left finger(s): Secondary | ICD-10-CM | POA: Diagnosis not present

## 2018-12-10 DIAGNOSIS — M25532 Pain in left wrist: Secondary | ICD-10-CM | POA: Diagnosis not present

## 2018-12-10 DIAGNOSIS — M25642 Stiffness of left hand, not elsewhere classified: Secondary | ICD-10-CM | POA: Diagnosis not present

## 2018-12-10 DIAGNOSIS — S66822D Laceration of other specified muscles, fascia and tendons at wrist and hand level, left hand, subsequent encounter: Secondary | ICD-10-CM | POA: Diagnosis not present

## 2018-12-10 DIAGNOSIS — M79645 Pain in left finger(s): Secondary | ICD-10-CM | POA: Diagnosis not present

## 2018-12-10 DIAGNOSIS — S6440XD Injury of digital nerve of unspecified finger, subsequent encounter: Secondary | ICD-10-CM | POA: Diagnosis not present

## 2018-12-10 DIAGNOSIS — T148XXA Other injury of unspecified body region, initial encounter: Secondary | ICD-10-CM | POA: Diagnosis not present

## 2018-12-10 DIAGNOSIS — S62651D Nondisplaced fracture of medial phalanx of left index finger, subsequent encounter for fracture with routine healing: Secondary | ICD-10-CM | POA: Diagnosis not present

## 2018-12-17 DIAGNOSIS — S66822D Laceration of other specified muscles, fascia and tendons at wrist and hand level, left hand, subsequent encounter: Secondary | ICD-10-CM | POA: Diagnosis not present

## 2018-12-17 DIAGNOSIS — M79645 Pain in left finger(s): Secondary | ICD-10-CM | POA: Diagnosis not present

## 2018-12-17 DIAGNOSIS — S6440XD Injury of digital nerve of unspecified finger, subsequent encounter: Secondary | ICD-10-CM | POA: Diagnosis not present

## 2018-12-17 DIAGNOSIS — M25642 Stiffness of left hand, not elsewhere classified: Secondary | ICD-10-CM | POA: Diagnosis not present

## 2018-12-17 DIAGNOSIS — T148XXA Other injury of unspecified body region, initial encounter: Secondary | ICD-10-CM | POA: Diagnosis not present

## 2018-12-17 DIAGNOSIS — M25532 Pain in left wrist: Secondary | ICD-10-CM | POA: Diagnosis not present

## 2018-12-17 DIAGNOSIS — S62651D Nondisplaced fracture of medial phalanx of left index finger, subsequent encounter for fracture with routine healing: Secondary | ICD-10-CM | POA: Diagnosis not present

## 2018-12-22 DIAGNOSIS — L570 Actinic keratosis: Secondary | ICD-10-CM | POA: Diagnosis not present

## 2018-12-22 DIAGNOSIS — D1801 Hemangioma of skin and subcutaneous tissue: Secondary | ICD-10-CM | POA: Diagnosis not present

## 2018-12-22 DIAGNOSIS — L819 Disorder of pigmentation, unspecified: Secondary | ICD-10-CM | POA: Diagnosis not present

## 2018-12-22 DIAGNOSIS — L821 Other seborrheic keratosis: Secondary | ICD-10-CM | POA: Diagnosis not present

## 2018-12-22 DIAGNOSIS — D229 Melanocytic nevi, unspecified: Secondary | ICD-10-CM | POA: Diagnosis not present

## 2018-12-22 DIAGNOSIS — L814 Other melanin hyperpigmentation: Secondary | ICD-10-CM | POA: Diagnosis not present

## 2018-12-22 DIAGNOSIS — L111 Transient acantholytic dermatosis [Grover]: Secondary | ICD-10-CM | POA: Diagnosis not present

## 2018-12-22 DIAGNOSIS — Z85828 Personal history of other malignant neoplasm of skin: Secondary | ICD-10-CM | POA: Diagnosis not present

## 2018-12-22 DIAGNOSIS — C44629 Squamous cell carcinoma of skin of left upper limb, including shoulder: Secondary | ICD-10-CM | POA: Diagnosis not present

## 2018-12-22 DIAGNOSIS — D485 Neoplasm of uncertain behavior of skin: Secondary | ICD-10-CM | POA: Diagnosis not present

## 2018-12-23 DIAGNOSIS — M25642 Stiffness of left hand, not elsewhere classified: Secondary | ICD-10-CM | POA: Diagnosis not present

## 2018-12-23 DIAGNOSIS — T148XXA Other injury of unspecified body region, initial encounter: Secondary | ICD-10-CM | POA: Diagnosis not present

## 2018-12-23 DIAGNOSIS — S62651D Nondisplaced fracture of medial phalanx of left index finger, subsequent encounter for fracture with routine healing: Secondary | ICD-10-CM | POA: Diagnosis not present

## 2018-12-23 DIAGNOSIS — S6440XD Injury of digital nerve of unspecified finger, subsequent encounter: Secondary | ICD-10-CM | POA: Diagnosis not present

## 2018-12-23 DIAGNOSIS — M25532 Pain in left wrist: Secondary | ICD-10-CM | POA: Diagnosis not present

## 2018-12-23 DIAGNOSIS — C44629 Squamous cell carcinoma of skin of left upper limb, including shoulder: Secondary | ICD-10-CM | POA: Diagnosis not present

## 2018-12-23 DIAGNOSIS — M79645 Pain in left finger(s): Secondary | ICD-10-CM | POA: Diagnosis not present

## 2018-12-23 DIAGNOSIS — S66822D Laceration of other specified muscles, fascia and tendons at wrist and hand level, left hand, subsequent encounter: Secondary | ICD-10-CM | POA: Diagnosis not present

## 2018-12-28 DIAGNOSIS — M25642 Stiffness of left hand, not elsewhere classified: Secondary | ICD-10-CM | POA: Diagnosis not present

## 2018-12-28 DIAGNOSIS — S66822D Laceration of other specified muscles, fascia and tendons at wrist and hand level, left hand, subsequent encounter: Secondary | ICD-10-CM | POA: Diagnosis not present

## 2018-12-28 DIAGNOSIS — S62651D Nondisplaced fracture of medial phalanx of left index finger, subsequent encounter for fracture with routine healing: Secondary | ICD-10-CM | POA: Diagnosis not present

## 2018-12-28 DIAGNOSIS — S6440XD Injury of digital nerve of unspecified finger, subsequent encounter: Secondary | ICD-10-CM | POA: Diagnosis not present

## 2018-12-28 DIAGNOSIS — T148XXA Other injury of unspecified body region, initial encounter: Secondary | ICD-10-CM | POA: Diagnosis not present

## 2019-01-05 DIAGNOSIS — L309 Dermatitis, unspecified: Secondary | ICD-10-CM | POA: Diagnosis not present

## 2019-01-05 DIAGNOSIS — L740 Miliaria rubra: Secondary | ICD-10-CM | POA: Diagnosis not present

## 2019-01-07 ENCOUNTER — Other Ambulatory Visit: Payer: Self-pay

## 2019-01-07 ENCOUNTER — Ambulatory Visit (HOSPITAL_COMMUNITY): Payer: Medicare Other | Attending: Cardiology

## 2019-01-07 DIAGNOSIS — I4891 Unspecified atrial fibrillation: Secondary | ICD-10-CM | POA: Insufficient documentation

## 2019-01-07 DIAGNOSIS — S62651D Nondisplaced fracture of medial phalanx of left index finger, subsequent encounter for fracture with routine healing: Secondary | ICD-10-CM | POA: Diagnosis not present

## 2019-01-07 DIAGNOSIS — S66822D Laceration of other specified muscles, fascia and tendons at wrist and hand level, left hand, subsequent encounter: Secondary | ICD-10-CM | POA: Diagnosis not present

## 2019-01-07 DIAGNOSIS — M25642 Stiffness of left hand, not elsewhere classified: Secondary | ICD-10-CM | POA: Diagnosis not present

## 2019-01-07 DIAGNOSIS — M79645 Pain in left finger(s): Secondary | ICD-10-CM | POA: Diagnosis not present

## 2019-01-07 DIAGNOSIS — S6440XD Injury of digital nerve of unspecified finger, subsequent encounter: Secondary | ICD-10-CM | POA: Diagnosis not present

## 2019-01-07 DIAGNOSIS — T148XXA Other injury of unspecified body region, initial encounter: Secondary | ICD-10-CM | POA: Diagnosis not present

## 2019-01-26 ENCOUNTER — Encounter: Payer: Self-pay | Admitting: Physician Assistant

## 2019-01-26 ENCOUNTER — Ambulatory Visit (INDEPENDENT_AMBULATORY_CARE_PROVIDER_SITE_OTHER): Payer: Medicare Other | Admitting: Physician Assistant

## 2019-01-26 ENCOUNTER — Other Ambulatory Visit: Payer: Self-pay

## 2019-01-26 VITALS — BP 122/74 | HR 62 | Ht 72.0 in | Wt 203.0 lb

## 2019-01-26 DIAGNOSIS — R001 Bradycardia, unspecified: Secondary | ICD-10-CM | POA: Diagnosis not present

## 2019-01-26 DIAGNOSIS — Z9889 Other specified postprocedural states: Secondary | ICD-10-CM

## 2019-01-26 DIAGNOSIS — Z8679 Personal history of other diseases of the circulatory system: Secondary | ICD-10-CM | POA: Diagnosis not present

## 2019-01-26 DIAGNOSIS — I480 Paroxysmal atrial fibrillation: Secondary | ICD-10-CM

## 2019-01-26 DIAGNOSIS — I10 Essential (primary) hypertension: Secondary | ICD-10-CM

## 2019-01-26 MED ORDER — CARVEDILOL 3.125 MG PO TABS: 3.1250 mg | ORAL_TABLET | Freq: Two times a day (BID) | ORAL | 3 refills | Status: DC

## 2019-01-26 NOTE — Progress Notes (Signed)
Cardiology Office Note    Date:  01/26/2019   ID:  [name], DOB Jan 31, 1946, MRN [id number]  PCP:  Josetta Huddle, MD  Cardiologist: Fransico Him, MD EPS: None  No chief complaint on file.   History of Present Illness:  Chase Smith. is a 73 y.o. male with history of MVP PAF and hypertension who developed Streptococcus viridans bacteremia 12/2017 TEE demonstrated MVP with severe MR, no vegetation identified.  He was treated for presumed bacterial endocarditis for 6 weeks.  Cardiac catheterization showed normal coronary arteries and severe MR.  He underwent minimally invasive mitral valve repair and Maze procedure and clipping of the LA appendage 04/27/2018.    I saw the patient postop 05/11/2018.  He was maintaining normal sinus rhythm on low-dose carvedilol 6.25 mg twice daily.  He did have a first-degree AV block but heart rate was running 85 consistently which was higher than he was used to.  I did not increase his carvedilol. I last saw him 06/2018 and he ws doing well. No changes made. Coumadin switched back to eliquis in march. Telemedicine visit with Dr. Radford Pax and ASA stopped.  F/u echo 01/07/19 normal LVEF 60-65%, mildly dilated aortic root, MV repair with mean gradient 3 mmHg, no MR   Patient was added onto my schedule today b/c of slow heart rates. He checked his BP/HR yesterday and HR was in the 42 and it came up to mid 50's. He runs/walk and legs felt week-2-3 miles/day. He was doing it at 12 noon in the extreme heat. Denies any fast heart rates. No chest pain or shortness of breath. Only gets dizzy if he bends over and stands quickly.    Past Medical History:  Diagnosis Date  . Abdominal mass, RLQ (right lower quadrant) 07/27/2012  . Ascending aortic aneurysm (HCC)    58mm by echo 02/2018 but normal by Chest CTA  . Benign essential HTN 02/07/2015  . BPH (benign prostatic hyperplasia)    mild  . ED (erectile dysfunction) 08/2011   cancelled f/u with Urology   . Epididymitis    recurrent  . Finger laceration    left index finger  . Hyperlipidemia   . Hypertension    in the past - resolved  . Left ventricular diastolic dysfunction, NYHA class 2   . Meckel's diverticulitis 08/27/2012  . MVP (mitral valve prolapse)    Bileaflet MVP with partially flail segment of the posterior leaflt with 4+ MR in setting of strep viridans bacteremia s/p minimally invasive MV repari  . PAF (paroxysmal atrial fibrillation) ([place]) 05/13/2017   CHADS2VASC score is 2 (HTN and age > 45) on Apixaban  . S/P minimally invasive maze operation for atrial fibrillation 04/27/2018   Complete bilateral atrial lesion set using cryothermy and bipolar radiofrequency ablation with clipping of LA appendage via right mini thoracotomy approach  . S/P minimally invasive mitral valve repair 04/27/2018   Complex valvuloplasty including triangular resection of P2 segment of posterior leaflet, artificial Gore-tex neochord placement x8 and Sorin Memo 4D ring annulopasty via right mini thoracotomy approach  . Vertigo    episodic mild positional    Past Surgical History:  Procedure Laterality Date  . APPENDECTOMY    . CARDIAC CATHETERIZATION  03/24/2018  . CLIPPING OF ATRIAL APPENDAGE  04/27/2018   Procedure: CLIPPING OF ATRIAL APPENDAGE;  Surgeon: Rexene Alberts, MD;  Location: [facility];  Service: Open Heart Surgery;;  . LAPAROSCOPIC APPENDECTOMY N/A 08/11/2012   Procedure: APPENDECTOMY LAPAROSCOPIC;  Surgeon: Stark Klein, MD;  Location: [facility];  Service: General;  Laterality: N/A;  . LAPAROSCOPIC SMALL BOWEL RESECTION N/A 08/11/2012   Procedure: Diagnostic Laparoscopy,  Diverticulectomy and laparoscopic appendectomy;  Surgeon: Stark Klein, MD;  Location: [facility];  Service: General;  Laterality: N/A;  Diagnostic Laparoscopy, Small Bowel Resection vs Diverticulectomy   . MINIMALLY INVASIVE MAZE PROCEDURE N/A 04/27/2018   Procedure: MINIMALLY INVASIVE MAZE PROCEDURE;  Surgeon: Rexene Alberts, MD;  Location: [facility];  Service: Open Heart Surgery;  Laterality: N/A;  . MITRAL VALVE REPAIR Right 04/27/2018   Procedure: MINIMALLY INVASIVE MITRAL VALVE REPAIR (MVR);  Surgeon: Rexene Alberts, MD;  Location: [facility];  Service: Open Heart Surgery;  Laterality: Right;  . NERVE, TENDON AND ARTERY REPAIR Left 10/28/2018   Procedure: TENDON, ARTERY, NERVE REPAIR LEFT INDEX FINGER, DEBRIDEMENT OF WOUND;  Surgeon: Daryll Brod, MD;  Location: [facility];  Service: Orthopedics;  Laterality: Left;  . PILONIDAL CYST EXCISION    . RECONSTRUCTION OF NOSE     secondary to deviated septum  . RIGHT/LEFT HEART CATH AND CORONARY ANGIOGRAPHY N/A 03/24/2018   Procedure: RIGHT/LEFT HEART CATH AND CORONARY ANGIOGRAPHY;  Surgeon: Jettie Booze, MD;  Location: [facility];  Service: Cardiovascular;  Laterality: N/A;  . TEE WITHOUT CARDIOVERSION N/A 08/08/2013   Procedure: TRANSESOPHAGEAL ECHOCARDIOGRAM (TEE);  Surgeon: Sueanne Margarita, MD;  Location: [facility];  Service: Cardiovascular;  Laterality: N/A;  . TEE WITHOUT CARDIOVERSION N/A 01/05/2018   Procedure: TRANSESOPHAGEAL ECHOCARDIOGRAM (TEE);  Surgeon: Lelon Perla, MD;  Location: [facility];  Service: Cardiovascular;  Laterality: N/A;  . TEE WITHOUT CARDIOVERSION N/A 04/27/2018   Procedure: TRANSESOPHAGEAL ECHOCARDIOGRAM (TEE);  Surgeon: Rexene Alberts, MD;  Location: [facility];  Service: Open Heart Surgery;  Laterality: N/A;  . TONSILLECTOMY      Current Medications: Current Meds  Medication Sig  . acetaminophen (TYLENOL) 500 MG tablet Take 500-1,000 mg by mouth every 6 (six) hours as needed for moderate pain or headache.  Marland Kitchen amoxicillin (AMOXIL) 500 MG capsule TAKE FOUR CAPSULES BY MOUTH 30-60 minutes prior TO dental procedures  . apixaban (ELIQUIS) 5 MG TABS tablet Take 1 tablet (5 mg total) by mouth 2 (two) times daily.  . Cholecalciferol (VITAMIN D) 2000 UNITS tablet Take 2,000 Units daily by mouth.   . Coenzyme Q10 (CO  Q-10 PO) Take 1 capsule by mouth daily.  . Cyanocobalamin (VITAMIN B12) 500 MCG TABS Take 500 mcg by mouth daily. Take for 5 days only.  . flunisolide (NASALIDE) 25 MCG/ACT (0.025%) SOLN Place 2 sprays into the nose daily as needed (for allergies).   . losartan (COZAAR) 50 MG tablet Take 25 mg by mouth daily.   . Polyvinyl Alcohol-Povidone PF (REFRESH) 1.4-0.6 % SOLN Place 1 drop into both eyes 3 (three) times daily as needed (for dry eyes).  . rosuvastatin (CRESTOR) 5 MG tablet Take 5 mg by mouth every Monday, Wednesday, and Friday.   . [DISCONTINUED] carvedilol (COREG) 6.25 MG tablet TAKE 1 TABLET (6.25 MG TOTAL) BY MOUTH 2 (TWO) TIMES DAILY WITH A MEAL.     Allergies:   Ace inhibitors; Sulfa antibiotics; and Antihistamines, chlorpheniramine-type   Social History   Socioeconomic History  . Marital status: Married    Spouse name: Not on file  . Number of children: Not on file  . Years of education: Not on file  . Highest education level: Not on file  Occupational History  . Not on file  Social Needs  . Financial resource strain: Not on file  . Food insecurity    Worry: Not on file    Inability: Not on file  . Transportation needs    Medical: Not on file    Non-medical: Not on file  Tobacco Use  . Smoking status: Never Smoker  . Smokeless tobacco: Never Used  Substance and Sexual Activity  . Alcohol use: Yes    Comment: one drink per day (wine or beer)  . Drug use: No  . Sexual activity: Yes  Lifestyle  . Physical activity    Days per week: Not on file    Minutes per session: Not on file  . Stress: Not on file  Relationships  . Social Herbalist on phone: Not on file    Gets together: Not on file    Attends religious service: Not on file    Active member of club or organization: Not on file    Attends meetings of clubs or organizations: Not on file    Relationship status: Not on file  Other Topics Concern  . Not on file  Social History Narrative  . Not  on file     Family History:  The patient's   family history includes Cancer in his father, maternal aunt, maternal uncle, mother, and paternal uncle; Heart failure in his father.   ROS:   Please see the history of present illness.    ROS All other systems reviewed and are negative.   PHYSICAL EXAM:   VS:  BP 122/74   Pulse 62   Ht 6' (1.829 m)   Wt 203 lb (92.1 kg)   SpO2 97%   BMI 27.53 kg/m   Physical Exam  GEN: Well nourished, well developed, in no acute distress  Neck: no JVD, carotid bruits, or masses Cardiac:RRR; no murmurs, rubs, or gallops  Respiratory:  clear to auscultation bilaterally, normal work of breathing GI: soft, nontender, nondistended, + BS Ext: without cyanosis, clubbing, or edema, Good distal pulses bilaterally Neuro:  Alert and Oriented x 3 Psych: euthymic mood, full affect  Wt Readings from Last 3 Encounters:  01/26/19 203 lb (92.1 kg)  10/28/18 203 lb 4.2 oz (92.2 kg)  10/19/18 208 lb (94.3 kg)      Studies/Labs Reviewed:   EKG:  EKG is  ordered today.  The ekg ordered today demonstrates NSR 63/m PAC. No acute change  Recent Labs: 04/23/2018: ALT 16 04/28/2018: Magnesium 2.8 05/01/2018: BUN 19; Creatinine, Ser 1.00; Hemoglobin 12.2; Platelets 127; Potassium 3.8; Sodium 133   Lipid Panel No results found for: CHOL, TRIG, HDL, CHOLHDL, VLDL, LDLCALC, LDLDIRECT  Additional studies/ records that were reviewed today include:  Echo 7/2020IMPRESSIONS      1. The left ventricle has normal systolic function with an ejection fraction of 60-65%. The cavity size was mildly dilated. Left ventricular diastolic function could not be evaluated. Elevated mean left atrial pressure.  2. The right ventricle has normal systolic function. The cavity was normal.  3. There is mild dilatation of the ascending aorta measuring 38 mm.  4. The tricuspid valve is grossly normal.  5. The aortic valve is tricuspid. No stenosis of the aortic valve.  6. Normal LV  systolic function; mild LVE; mildly dilated aortic root; s/p MV repair with MVA 1.6 cm2 by pressure halftime and mean gradient 3 mmHg; no MR.   FINDINGS  Left Ventricle: The left ventricle has normal systolic function, with an ejection fraction of  60-65%. The cavity size was mildly dilated. There is no increase in left ventricular wall thickness. Left ventricular diastolic function could not be  evaluated. Elevated mean left atrial pressure   Right Ventricle: The right ventricle has normal systolic function. The cavity was normal.   Left Atrium: Left atrial size was normal in size.   Right Atrium: Right atrial size was normal in size. Right atrial pressure is estimated at 3 mmHg.   Interatrial Septum: No atrial level shunt detected by color flow Doppler.   Pericardium: There is no evidence of pericardial effusion.   Mitral Valve: The mitral valve has been repaired/replaced. Mitral valve regurgitation is not visualized by color flow Doppler.   Tricuspid Valve: The tricuspid valve is grossly normal. Tricuspid valve regurgitation is trivial by color flow Doppler.   Aortic Valve: The aortic valve is tricuspid Aortic valve regurgitation was not visualized by color flow Doppler. There is No stenosis of the aortic valve.   Pulmonic Valve: The pulmonic valve was not well visualized. Pulmonic valve regurgitation is trivial by color flow Doppler.   Aorta: There is mild dilatation of the ascending aorta measuring 38 mm.   Venous: The inferior vena cava is normal in size with greater than 50% respiratory variability.   Additional Comments: Normal LV systolic function; mild LVE; mildly dilated aortic root; s/p MV repair with MVA 1.6 cm2 by pressure halftime and mean gradient 3 mmHg; no MR.     TEE 04/27/2018 Result status: Final result   Septum: No Patent Foramen Ovale present.  Left atrium: Patent foramen ovale not present.  Aortic valve: No stenosis. Trace regurgitation. No AV vegetation.   Mitral valve: Moderate regurgitation. Flail portion of the posterior leaflet involving the middle scallop.  Right ventricle: Normal cavity size, wall thickness and ejection fraction. No thrombus present. No mass present.  Pulmonic valve: Trace regurgitation.         ASSESSMENT:    1. S/P minimally invasive mitral valve repair    2. S/P minimally invasive maze operation for atrial fibrillation   3. Benign essential HTN   4. PAF (paroxysmal atrial fibrillation) ([place])   5. Bradycardia      PLAN:  In order of problems listed above:  S/P minimally invasive mitral  Valve repair and MAZE procedure for Afib after bacterial endocarditis/MV disease 2019. F/u echo 12/2018 stable valve repair-see above for details. SBE prophylaxis  Essential HTN BP stable    PAF-s/p LA clipping and MAZE procedure on carvedilol and eliquis.   Bradycardia-HR in 40's at rest. Will decrease carvedilol 3.125 mg BID. Patient thinking about getting a fitbit or monitor for afib. He'll call us if he has HR below 50 on lower dose. F/u with Dr. Radford Pax in Oct.    Medication Adjustments/Labs and Tests Ordered: Current medicines are reviewed at length with the patient today.  Concerns regarding medicines are outlined above.  Medication changes, Labs and Tests ordered today are listed in the Patient Instructions below. Patient Instructions  Medication Instructions:  DECREASE: Carvediolol (Coreg) to 3.125 mg twice a day   If you need a refill on your cardiac medications before your next appointment, please call your pharmacy.   Lab work: None   If you have labs (blood work) drawn today and your tests are completely normal, you will receive your results only by: Marland Kitchen MyChart Message (if you have MyChart) OR . A paper copy in the mail If you have any lab test that is abnormal or we need to  change your treatment, we will call you to review the results.  Testing/Procedures: None   Follow-Up: At [facility],  you and your health needs are our priority.  As part of our continuing mission to provide you with exceptional heart care, we have created designated Provider Care Teams.  These Care Teams include your primary Cardiologist (physician) and Advanced Practice Providers (APPs -  Physician Assistants and Nurse Practitioners) who all work together to provide you with the care you need, when you need it. You will need a follow up appointment in 3 months (Can be virtual).  Please call our office 2 months in advance to schedule this appointment.  You may see Fransico Him, MD or one of the following Advanced Practice Providers on your designated Care Team:   [place], PA-C Melina Copa, PA-C . Ermalinda Barrios, PA-C  Any Other Special Instructions Will Be Listed Below (If Applicable).       Signed, Ermalinda Barrios, PA-C  01/26/2019 2:50 PM    [facility] [address], [place], [place]  [address] Phone: [phone]; Fax: [phone]

## 2019-01-26 NOTE — Patient Instructions (Signed)
Medication Instructions:  DECREASE: Carvediolol (Coreg) to 3.125 mg twice a day   If you need a refill on your cardiac medications before your next appointment, please call your pharmacy.   Lab work: None   If you have labs (blood work) drawn today and your tests are completely normal, you will receive your results only by: Marland Kitchen MyChart Message (if you have MyChart) OR . A paper copy in the mail If you have any lab test that is abnormal or we need to change your treatment, we will call you to review the results.  Testing/Procedures: None   Follow-Up: At [facility], you and your health needs are our priority.  As part of our continuing mission to provide you with exceptional heart care, we have created designated Provider Care Teams.  These Care Teams include your primary Cardiologist (physician) and Advanced Practice Providers (APPs -  Physician Assistants and Nurse Practitioners) who all work together to provide you with the care you need, when you need it. You will need a follow up appointment in 3 months (Can be virtual).  Please call our office 2 months in advance to schedule this appointment.  You may see Fransico Him, MD or one of the following Advanced Practice Providers on your designated Care Team:   [place], PA-C Melina Copa, PA-C . Ermalinda Barrios, PA-C  Any Other Special Instructions Will Be Listed Below (If Applicable).

## 2019-03-18 DIAGNOSIS — T148XXA Other injury of unspecified body region, initial encounter: Secondary | ICD-10-CM | POA: Diagnosis not present

## 2019-03-18 DIAGNOSIS — S62651D Nondisplaced fracture of medial phalanx of left index finger, subsequent encounter for fracture with routine healing: Secondary | ICD-10-CM | POA: Diagnosis not present

## 2019-03-18 DIAGNOSIS — S66822D Laceration of other specified muscles, fascia and tendons at wrist and hand level, left hand, subsequent encounter: Secondary | ICD-10-CM | POA: Diagnosis not present

## 2019-03-18 DIAGNOSIS — S6440XD Injury of digital nerve of unspecified finger, subsequent encounter: Secondary | ICD-10-CM | POA: Diagnosis not present

## 2019-03-22 DIAGNOSIS — L905 Scar conditions and fibrosis of skin: Secondary | ICD-10-CM | POA: Diagnosis not present

## 2019-03-22 DIAGNOSIS — D485 Neoplasm of uncertain behavior of skin: Secondary | ICD-10-CM | POA: Diagnosis not present

## 2019-04-08 ENCOUNTER — Other Ambulatory Visit: Payer: Self-pay | Admitting: Cardiology

## 2019-04-08 MED ORDER — CARVEDILOL 3.125 MG PO TABS: 3.1250 mg | ORAL_TABLET | Freq: Two times a day (BID) | ORAL | 2 refills | Status: DC

## 2019-04-18 ENCOUNTER — Encounter: Payer: Self-pay | Admitting: Cardiology

## 2019-04-18 ENCOUNTER — Ambulatory Visit: Payer: Medicare Other | Admitting: Thoracic Surgery (Cardiothoracic Vascular Surgery)

## 2019-04-18 ENCOUNTER — Ambulatory Visit (INDEPENDENT_AMBULATORY_CARE_PROVIDER_SITE_OTHER): Payer: Medicare Other | Admitting: Cardiology

## 2019-04-18 ENCOUNTER — Other Ambulatory Visit: Payer: Self-pay

## 2019-04-18 VITALS — BP 120/80 | HR 85 | Ht 72.0 in | Wt 210.0 lb

## 2019-04-18 DIAGNOSIS — I340 Nonrheumatic mitral (valve) insufficiency: Secondary | ICD-10-CM | POA: Diagnosis not present

## 2019-04-18 DIAGNOSIS — I480 Paroxysmal atrial fibrillation: Secondary | ICD-10-CM | POA: Diagnosis not present

## 2019-04-18 DIAGNOSIS — I7121 Aneurysm of the ascending aorta, without rupture: Secondary | ICD-10-CM

## 2019-04-18 DIAGNOSIS — I712 Thoracic aortic aneurysm, without rupture: Secondary | ICD-10-CM | POA: Diagnosis not present

## 2019-04-18 DIAGNOSIS — R001 Bradycardia, unspecified: Secondary | ICD-10-CM

## 2019-04-18 DIAGNOSIS — I10 Essential (primary) hypertension: Secondary | ICD-10-CM | POA: Diagnosis not present

## 2019-04-18 NOTE — Progress Notes (Signed)
Cardiology Office Note:    Date:  04/18/2019   ID:  Chase Martin., DOB [birth year], MRN [id number]  PCP:  Josetta Huddle, MD  Cardiologist:  Fransico Him, MD    Referring MD: Josetta Huddle, MD   Chief Complaint  Patient presents with  . Mitral Regurgitation  . Hypertension    History of Present Illness:    Chase Disantis. is a 73 y.o. male with a hx of bileaflet mitral valve prolapse with mildMR, dilated aortic root,grade 2 diastolic dysfunction and PAF. He is onEliquis for a CHADS2VASC score of 4.He was admitted with strep viridans bacteremia in July 2019. 2D echo7/2019showed normal LVF with EF60-65% withsevereholosystolic MVP of the posterior MV leaflet with severeMR that was eccentric.Due to bacteremia patient underwent TEE which confirmed severe mitral prolapse of the PMLV with severe MR. There was also an oscillating density that was felt to possibly be due to a ruptured cord but could not rule out small vegetation and patient was treated with a prolonged course of antibx for endocarditis.   Followup 2D echo 02/2018 showed normal LVF with flail segment of posterior MVL with moderate to severe anteriorly directed MR.  He was referred to structural heart team and underwent right and left heart cath showing normal coronary arteries and severe MR and underwent minimally invasive MV repair and MAZE procedure with LA appendage clipping 04/27/2018.   He was seen by Estella Husk, PA in July due to bradycardia with HR in the low 40's.  The patient got a fitbit but has not seen any lower HR since then.   he is here today for followup and is doing well.  He denies any chest pain or pressure, SOB, DOE, PND, orthopnea, LE edema, dizziness, palpitations or syncope. He is compliant with his meds and is tolerating meds with no SE.   Past Medical History:  Diagnosis Date  . Abdominal mass, RLQ (right lower quadrant) 07/27/2012  . Ascending aortic aneurysm (HCC)    71mm by  echo 02/2018 but normal by Chest CTA  . Benign essential HTN 02/07/2015  . BPH (benign prostatic hyperplasia)    mild  . ED (erectile dysfunction) 08/2011   cancelled f/u with Urology  . Epididymitis    recurrent  . Finger laceration    left index finger  . Hyperlipidemia   . Hypertension    in the past - resolved  . Left ventricular diastolic dysfunction, NYHA class 2   . Meckel's diverticulitis 08/27/2012  . MVP (mitral valve prolapse)    Bileaflet MVP with partially flail segment of the posterior leaflt with 4+ MR in setting of strep viridans bacteremia s/p minimally invasive MV repari  . PAF (paroxysmal atrial fibrillation) ([place]) 05/13/2017   CHADS2VASC score is 2 (HTN and age > 80) on Apixaban  . S/P minimally invasive maze operation for atrial fibrillation 04/27/2018   Complete bilateral atrial lesion set using cryothermy and bipolar radiofrequency ablation with clipping of LA appendage via right mini thoracotomy approach  . S/P minimally invasive mitral valve repair 04/27/2018   Complex valvuloplasty including triangular resection of P2 segment of posterior leaflet, artificial Gore-tex neochord placement x8 and Sorin Memo 4D ring annulopasty via right mini thoracotomy approach  . Vertigo    episodic mild positional    Past Surgical History:  Procedure Laterality Date  . APPENDECTOMY    . CARDIAC CATHETERIZATION  03/24/2018  . CLIPPING OF ATRIAL APPENDAGE  04/27/2018   Procedure: CLIPPING OF ATRIAL APPENDAGE;  Surgeon: Rexene Alberts, MD;  Location: [place];  Service: Open Heart Surgery;;  . LAPAROSCOPIC APPENDECTOMY N/A 08/11/2012   Procedure: APPENDECTOMY LAPAROSCOPIC;  Surgeon: Stark Klein, MD;  Location: [facility];  Service: General;  Laterality: N/A;  . LAPAROSCOPIC SMALL BOWEL RESECTION N/A 08/11/2012   Procedure: Diagnostic Laparoscopy,  Diverticulectomy and laparoscopic appendectomy;  Surgeon: Stark Klein, MD;  Location: [facility];  Service: General;  Laterality: N/A;   Diagnostic Laparoscopy, Small Bowel Resection vs Diverticulectomy   . MINIMALLY INVASIVE MAZE PROCEDURE N/A 04/27/2018   Procedure: MINIMALLY INVASIVE MAZE PROCEDURE;  Surgeon: Rexene Alberts, MD;  Location: [facility];  Service: Open Heart Surgery;  Laterality: N/A;  . MITRAL VALVE REPAIR Right 04/27/2018   Procedure: MINIMALLY INVASIVE MITRAL VALVE REPAIR (MVR);  Surgeon: Rexene Alberts, MD;  Location: [facility];  Service: Open Heart Surgery;  Laterality: Right;  . NERVE, TENDON AND ARTERY REPAIR Left 10/28/2018   Procedure: TENDON, ARTERY, NERVE REPAIR LEFT INDEX FINGER, DEBRIDEMENT OF WOUND;  Surgeon: Daryll Brod, MD;  Location: [place];  Service: Orthopedics;  Laterality: Left;  . PILONIDAL CYST EXCISION    . RECONSTRUCTION OF NOSE     secondary to deviated septum  . RIGHT/LEFT HEART CATH AND CORONARY ANGIOGRAPHY N/A 03/24/2018   Procedure: RIGHT/LEFT HEART CATH AND CORONARY ANGIOGRAPHY;  Surgeon: Jettie Booze, MD;  Location: [facility];  Service: Cardiovascular;  Laterality: N/A;  . TEE WITHOUT CARDIOVERSION N/A 08/08/2013   Procedure: TRANSESOPHAGEAL ECHOCARDIOGRAM (TEE);  Surgeon: Sueanne Margarita, MD;  Location: [facility];  Service: Cardiovascular;  Laterality: N/A;  . TEE WITHOUT CARDIOVERSION N/A 01/05/2018   Procedure: TRANSESOPHAGEAL ECHOCARDIOGRAM (TEE);  Surgeon: Lelon Perla, MD;  Location: [facility];  Service: Cardiovascular;  Laterality: N/A;  . TEE WITHOUT CARDIOVERSION N/A 04/27/2018   Procedure: TRANSESOPHAGEAL ECHOCARDIOGRAM (TEE);  Surgeon: Rexene Alberts, MD;  Location: [place];  Service: Open Heart Surgery;  Laterality: N/A;  . TONSILLECTOMY      Current Medications: Current Meds  Medication Sig  . acetaminophen (TYLENOL) 500 MG tablet Take 500-1,000 mg by mouth every 6 (six) hours as needed for moderate pain or headache.  Marland Kitchen amoxicillin (AMOXIL) 500 MG capsule TAKE FOUR CAPSULES BY MOUTH 30-60 minutes prior TO dental procedures  .  apixaban (ELIQUIS) 5 MG TABS tablet Take 1 tablet (5 mg total) by mouth 2 (two) times daily.  . carvedilol (COREG) 3.125 MG tablet Take 1 tablet (3.125 mg total) by mouth 2 (two) times daily.  . Cholecalciferol (VITAMIN D) 2000 UNITS tablet Take 2,000 Units daily by mouth.   . Coenzyme Q10 (CO Q-10 PO) Take 1 capsule by mouth daily.  . Cyanocobalamin (VITAMIN B12) 500 MCG TABS Take 500 mcg by mouth daily. Take for 5 days only.  . flunisolide (NASALIDE) 25 MCG/ACT (0.025%) SOLN Place 2 sprays into the nose daily as needed (for allergies).   . losartan (COZAAR) 50 MG tablet Take 25 mg by mouth daily.   . Polyvinyl Alcohol-Povidone PF (REFRESH) 1.4-0.6 % SOLN Place 1 drop into both eyes 3 (three) times daily as needed (for dry eyes).  . rosuvastatin (CRESTOR) 5 MG tablet Take 5 mg by mouth every Monday, Wednesday, and Friday.      Allergies:   Ace inhibitors; Sulfa antibiotics; and Antihistamines, chlorpheniramine-type   Social History   Socioeconomic History  . Marital status: Married    Spouse name: Not on file  . Number of children: Not on file  . Years  of education: Not on file  . Highest education level: Not on file  Occupational History  . Not on file  Social Needs  . Financial resource strain: Not on file  . Food insecurity    Worry: Not on file    Inability: Not on file  . Transportation needs    Medical: Not on file    Non-medical: Not on file  Tobacco Use  . Smoking status: Never Smoker  . Smokeless tobacco: Never Used  Substance and Sexual Activity  . Alcohol use: Yes    Comment: one drink per day (wine or beer)  . Drug use: No  . Sexual activity: Yes  Lifestyle  . Physical activity    Days per week: Not on file    Minutes per session: Not on file  . Stress: Not on file  Relationships  . Social Herbalist on phone: Not on file    Gets together: Not on file    Attends religious service: Not on file    Active member of club or organization: Not on file     Attends meetings of clubs or organizations: Not on file    Relationship status: Not on file  Other Topics Concern  . Not on file  Social History Narrative  . Not on file     Family History: The patient's family history includes Cancer in his father, maternal aunt, maternal uncle, mother, and paternal uncle; Heart failure in his father.  ROS:   Please see the history of present illness.    ROS  All other systems reviewed and negative.   EKGs/Labs/Other Studies Reviewed:    The following studies were reviewed today: none  EKG:  EKG is  ordered today.  The ekg ordered today demonstrates NSR with PACs at 72bpm with no ST changes  Recent Labs: 04/23/2018: ALT 16 04/28/2018: Magnesium 2.8 05/01/2018: BUN 19; Creatinine, Ser 1.00; Hemoglobin 12.2; Platelets 127; Potassium 3.8; Sodium 133   Recent Lipid Panel No results found for: CHOL, TRIG, HDL, CHOLHDL, VLDL, LDLCALC, LDLDIRECT  Physical Exam:    VS:  BP 120/80   Pulse 85   Ht 6' (1.829 m)   Wt 210 lb (95.3 kg)   SpO2 98%   BMI 28.48 kg/m     Wt Readings from Last 3 Encounters:  04/18/19 210 lb (95.3 kg)  01/26/19 203 lb (92.1 kg)  10/28/18 203 lb 4.2 oz (92.2 kg)     GEN:  Well nourished, well developed in no acute distress HEENT: Normal NECK: No JVD; No carotid bruits LYMPHATICS: No lymphadenopathy CARDIAC: RRR, no murmurs, rubs, gallops RESPIRATORY:  Clear to auscultation without rales, wheezing or rhonchi  ABDOMEN: Soft, non-tender, non-distended MUSCULOSKELETAL:  No edema; No deformity  SKIN: Warm and dry NEUROLOGIC:  Alert and oriented x 3 PSYCHIATRIC:  Normal affect   ASSESSMENT:    1. Severe mitral valve regurgitation   2. Benign essential HTN   3. PAF (paroxysmal atrial fibrillation) ([place])   4. Bradycardia   5. Ascending aortic aneurysm (HCC)    PLAN:    In order of problems listed above:  1.  Severe MVP with flail chordae and severe MR -s/p MV repair after bacterial endocarditis in  2019 -2D echo 12/2018 showed stable MV repair with MVA 1.6cm2 and mean gradient 72mHg with no MR  2.  HTN -BP controlled -continue Losartan 25mg  daily -Creatinine 0.96 on 07/2018  3.  PAF -s/p MAZE at time of MV repair -maintaining  NSR -continue Eliquis 5mg  BID and carvedilol 3.125mg  BID -check BMET and Hbg  4.  Bradycardia -resolved after decreasing carvedilol  5.  Ascending aortic aneurysm -very mild at 82mm -repeat echo in 1 year to followup -continue statin   Medication Adjustments/Labs and Tests Ordered: Current medicines are reviewed at length with the patient today.  Concerns regarding medicines are outlined above.  No orders of the defined types were placed in this encounter.  No orders of the defined types were placed in this encounter.   Signed, Fransico Him, MD  04/18/2019 1:33 PM    [facility]

## 2019-04-18 NOTE — Patient Instructions (Addendum)
Medication Instructions:  Your physician recommends that you continue on your current medications as directed. Please refer to the Current Medication list given to you today.  *If you need a refill on your cardiac medications before your next appointment, please call your pharmacy*   Lab Work: TODAY - basic metabolic panel If you have labs (blood work) drawn today and your tests are completely normal, you will receive your results only by: Marland Kitchen MyChart Message (if you have MyChart) OR . A paper copy in the mail If you have any lab test that is abnormal or we need to change your treatment, we will call you to review the results.  Testing/Procedures: Your physician has requested that you have an echocardiogram. Echocardiography is a painless test that uses sound waves to create images of your heart. It provides your doctor with information about the size and shape of your heart and how well your heart's chambers and valves are working. This procedure takes approximately one hour. There are no restrictions for this procedure.   Follow-Up: At [facility], you and your health needs are our priority.  As part of our continuing mission to provide you with exceptional heart care, we have created designated Provider Care Teams.  These Care Teams include your primary Cardiologist (physician) and Advanced Practice Providers (APPs -  Physician Assistants and Nurse Practitioners) who all work together to provide you with the care you need, when you need it.  Your next appointment:   12 months  The format for your next appointment:   Either In Person or Virtual  Provider:   You may see Fransico Him, MD or one of the following Advanced Practice Providers on your designated Care Team:    Melina Copa, PA-C  Ermalinda Barrios, PA-C

## 2019-04-19 LAB — BASIC METABOLIC PANEL
BUN/Creatinine Ratio: 17 (ref 10–24)
BUN: 17 mg/dL (ref 8–27)
CO2: 22 mmol/L (ref 20–29)
Calcium: 9.1 mg/dL (ref 8.6–10.2)
Chloride: 107 mmol/L — ABNORMAL HIGH (ref 96–106)
Creatinine, Ser: 0.98 mg/dL (ref 0.76–1.27)
GFR calc Af Amer: 89 mL/min/{1.73_m2} (ref >59)
GFR calc non Af Amer: 77 mL/min/{1.73_m2} (ref >59)
Glucose: 84 mg/dL (ref 65–99)
Potassium: 4.3 mmol/L (ref 3.5–5.2)
Sodium: 141 mmol/L (ref 134–144)

## 2019-04-21 ENCOUNTER — Encounter: Payer: Self-pay | Admitting: Thoracic Surgery (Cardiothoracic Vascular Surgery)

## 2019-04-21 ENCOUNTER — Other Ambulatory Visit: Payer: Self-pay

## 2019-04-21 ENCOUNTER — Ambulatory Visit (INDEPENDENT_AMBULATORY_CARE_PROVIDER_SITE_OTHER): Payer: Medicare Other | Admitting: Thoracic Surgery (Cardiothoracic Vascular Surgery)

## 2019-04-21 VITALS — BP 140/86 | HR 70 | Temp 97.7°F | Resp 20 | Ht 72.0 in | Wt 210.0 lb

## 2019-04-21 DIAGNOSIS — Z8679 Personal history of other diseases of the circulatory system: Secondary | ICD-10-CM | POA: Diagnosis not present

## 2019-04-21 DIAGNOSIS — I480 Paroxysmal atrial fibrillation: Secondary | ICD-10-CM | POA: Diagnosis not present

## 2019-04-21 DIAGNOSIS — Z9889 Other specified postprocedural states: Secondary | ICD-10-CM | POA: Diagnosis not present

## 2019-04-21 DIAGNOSIS — I10 Essential (primary) hypertension: Secondary | ICD-10-CM | POA: Diagnosis not present

## 2019-04-21 NOTE — Patient Instructions (Signed)

## 2019-04-21 NOTE — Progress Notes (Signed)
[address]       [place] [address]             [facility] OFFICE NOTE  Referring Provider is Sherren Mocha, MD Primary Cardiologist is Fransico Him, MD PCP is Josetta Huddle, MD   HPI:  Patient is a 73 year old male with history of mitral valve prolapse with mitral regurgitation, recurrent paroxysmal atrial fibrillation and hypertension who returns to the office today for routine follow-up status post minimally invasive mitral valve repair and Maze procedure on April 27, 2018.  He was last seen here in our office on August 02, 2018 at which time he was doing well and maintaining sinus rhythm.  He underwent follow-up echocardiogram January 07, 2019 which revealed normal left ventricular systolic function and intact mitral valve repair with no residual mitral regurgitation.  Mean transvalvular gradient across the mitral valve was estimated 3 mmHg.  Patient returns to the office today for routine follow-up.  He reports that overall he is doing very well.  He states that overall he feels "much improved" in comparison with how he felt prior to his surgery last year.  He reports no physical limitations and states that he only gets short of breath if he really pushes himself physically.  He is actually gotten back into bicycling and reports that he is doing very well.  He never gets any sort of chest pain or chest tightness.  He has not had any significant palpitations or other symptoms similar to what he remembers when he had paroxysmal atrial fibrillation in the past.  Overall he is quite pleased with his result.  At this point he remains chronically anticoagulated using Eliquis.   Current Outpatient Medications  Medication Sig Dispense Refill  . acetaminophen (TYLENOL) 500 MG tablet Take 500-1,000 mg by mouth every 6 (six) hours as needed for moderate pain or headache.    Marland Kitchen amoxicillin (AMOXIL) 500 MG capsule TAKE FOUR CAPSULES BY MOUTH 30-60  minutes prior TO dental procedures 4 capsule 1  . apixaban (ELIQUIS) 5 MG TABS tablet Take 1 tablet (5 mg total) by mouth 2 (two) times daily. 60 tablet   . carvedilol (COREG) 3.125 MG tablet Take 1 tablet (3.125 mg total) by mouth 2 (two) times daily. 180 tablet 2  . Cholecalciferol (VITAMIN D) 2000 UNITS tablet Take 2,000 Units daily by mouth.     . Coenzyme Q10 (CO Q-10 PO) Take 1 capsule by mouth daily.    . Cyanocobalamin (VITAMIN B12) 500 MCG TABS Take 500 mcg by mouth daily. Take for 5 days only.    . flunisolide (NASALIDE) 25 MCG/ACT (0.025%) SOLN Place 2 sprays into the nose daily as needed (for allergies).     . losartan (COZAAR) 50 MG tablet Take 25 mg by mouth daily.     . Polyvinyl Alcohol-Povidone PF (REFRESH) 1.4-0.6 % SOLN Place 1 drop into both eyes 3 (three) times daily as needed (for dry eyes).    . rosuvastatin (CRESTOR) 5 MG tablet Take 5 mg by mouth every Monday, Wednesday, and Friday.   3   No current facility-administered medications for this visit.       Physical Exam:   BP 140/86   Pulse 70   Temp 97.7 F (36.5 C) (Skin)   Resp 20   Ht 6' (1.829 m)   Wt 210 lb (95.3 kg)   SpO2 97% Comment: RA  BMI 28.48 kg/m   General:  Well-appearing  Chest:   Clear to auscultation  CV:   Regular rate and rhythm without murmur  Incisions:  Completely healed  Abdomen:  Soft nontender  Extremities:  Warm and well-perfused  Diagnostic Tests:  2 channel telemetry rhythm strip demonstrates sinus rhythm      ECHOCARDIOGRAM REPORT       Patient Name:   Chase Smith. Date of Exam: 01/07/2019 Medical Rec #:  [id number]          Height:       72.0 in Accession #:    [id number]         Weight:       203.3 lb Date of Birth:  [birth year]          BSA:          2.15 m Patient Age:    13 years           BP:           120/77 mmHg Patient Gender: M                  HR:           72 bpm. Exam Location:  [facility]    Procedure: 2D Echo, Color Doppler and Cardiac  Doppler  Indications:    I48.91 Atrial fibrillation   History:        Patient has prior history of Echocardiogram examinations, most                 recent 04/27/2018. Atrial Fibrillation Mitral Valve Disease and                 History of mitral valve prolapse with severe MR. Mitral Valve: A                 Repair valve is present in the mitral position. Status post                 minimally invasive Mitral valve repair and MAZE procedure with                 LA appendage clipping 04/27/18. Complex valvuloplasty including                 triangular resection of P2 segement of posterior mitral valve                 leaflet, artificial Goretex neochord placement x 8 and Sorin                 Memo 4 D ring annuloplasty via right mini thoractomy approach.   Sonographer:    Lenard Galloway BA, RDCS Referring Phys: [place]    1. The left ventricle has normal systolic function with an ejection fraction of 60-65%. The cavity size was mildly dilated. Left ventricular diastolic function could not be evaluated. Elevated mean left atrial pressure.  2. The right ventricle has normal systolic function. The cavity was normal.  3. There is mild dilatation of the ascending aorta measuring 38 mm.  4. The tricuspid valve is grossly normal.  5. The aortic valve is tricuspid. No stenosis of the aortic valve.  6. Normal LV systolic function; mild LVE; mildly dilated aortic root; s/p MV repair with MVA 1.6 cm2 by pressure halftime and mean gradient 3 mmHg; no MR.  FINDINGS  Left Ventricle: The left ventricle has normal systolic function, with an ejection fraction of 60-65%. The cavity  size was mildly dilated. There is no increase in left ventricular wall thickness. Left ventricular diastolic function could not be  evaluated. Elevated mean left atrial pressure  Right Ventricle: The right ventricle has normal systolic function. The cavity was normal.  Left Atrium: Left atrial  size was normal in size.  Right Atrium: Right atrial size was normal in size. Right atrial pressure is estimated at 3 mmHg.  Interatrial Septum: No atrial level shunt detected by color flow Doppler.  Pericardium: There is no evidence of pericardial effusion.  Mitral Valve: The mitral valve has been repaired/replaced. Mitral valve regurgitation is not visualized by color flow Doppler.  Tricuspid Valve: The tricuspid valve is grossly normal. Tricuspid valve regurgitation is trivial by color flow Doppler.  Aortic Valve: The aortic valve is tricuspid Aortic valve regurgitation was not visualized by color flow Doppler. There is No stenosis of the aortic valve.  Pulmonic Valve: The pulmonic valve was not well visualized. Pulmonic valve regurgitation is trivial by color flow Doppler.  Aorta: There is mild dilatation of the ascending aorta measuring 38 mm.  Venous: The inferior vena cava is normal in size with greater than 50% respiratory variability.  Additional Comments: Normal LV systolic function; mild LVE; mildly dilated aortic root; s/p MV repair with MVA 1.6 cm2 by pressure halftime and mean gradient 3 mmHg; no MR.    +--------------+--------++ LEFT VENTRICLE         +----------------+---------++ +--------------+--------++ Diastology                PLAX 2D                +----------------+---------++ +--------------+--------++ LV e' lateral:  6.96 cm/s LVIDd:        5.29 cm  +----------------+---------++ +--------------+--------++ LV E/e' lateral:23.7      LVIDs:        3.21 cm  +----------------+---------++ +--------------+--------++ LV e' medial:   7.45 cm/s LV PW:        0.95 cm  +----------------+---------++ +--------------+--------++ LV E/e' medial: 22.1      LV IVS:       0.63 cm  +----------------+---------++ +--------------+--------++ LVOT diam:    2.50 cm  +--------------+--------++ LV SV:        93 ml     +--------------+--------++ LV SV Index:  42.89    +--------------+--------++ LVOT Area:    4.91 cm +--------------+--------++                        +--------------+--------++  +---------------+----------++ RIGHT VENTRICLE           +---------------+----------++ RV Basal diam: 3.66 cm    +---------------+----------++ RV S prime:    10.10 cm/s +---------------+----------++ TAPSE (M-mode):2.3 cm     +---------------+----------++  +---------------+-------++-----------++ LEFT ATRIUM           Index       +---------------+-------++-----------++ LA diam:       3.40 cm1.58 cm/m  +---------------+-------++-----------++ LA Vol (A2C):  55.6 ml25.92 ml/m +---------------+-------++-----------++ LA Vol (A4C):  46.6 ml21.72 ml/m +---------------+-------++-----------++ LA Biplane Vol:52.1 ml24.29 ml/m +---------------+-------++-----------++  +------------+-----------++ AORTIC VALVE            +------------+-----------++ LVOT Vmax:  84.90 cm/s  +------------+-----------++ LVOT Vmean: 54.500 cm/s +------------+-----------++ LVOT VTI:   0.172 m     +------------+-----------++   +-------------+-------++ AORTA                +-------------+-------++ Ao Root diam:3.60 cm +-------------+-------++ Ao Asc diam: 3.80 cm +-------------+-------++  +--------------+-----------++  MITRAL VALVE              +--------------+-------+ +--------------+-----------++ SHUNTS                MV Area (PHT):1.62 cm    +--------------+-------+ +--------------+-----------++ Systemic VTI: 0.17 m  MV Peak grad: 10.4 mmHg   +--------------+-------+ +--------------+-----------++ Systemic Diam:2.50 cm MV Mean grad: 3.0 mmHg    +--------------+-------+ +--------------+-----------++ MV Vmax:      1.61 m/s    +--------------+-----------++ MV Vmean:     74.4 cm/s    +--------------+-----------++ MV VTI:       0.54 m      +--------------+-----------++ MV PHT:       135.72 msec +--------------+-----------++ MV Decel Time:468 msec    +--------------+-----------++ +--------------+-----------++ MV E velocity:165.00 cm/s +--------------+-----------++ MV A velocity:46.70 cm/s  +--------------+-----------++ MV E/A ratio: 3.53        +--------------+-----------++    Kirk Ruths MD Electronically signed by Kirk Ruths MD Signature Date/Time: 01/07/2019/2:16:53 PM           Impression:  Patient is doing very well approximately 1 year status post minimally invasive mitral valve repair and Maze procedure.  He is maintaining sinus rhythm and he denies any symptoms of congestive heart failure or palpitations suggestive of recurrent paroxysmal atrial fibrillation.  I have personally reviewed the patient's follow-up echocardiogram performed last July which demonstrates normal left ventricular systolic function and intact mitral valve repair with no residual mitral regurgitation.    Plan:  We have not recommended any change to the patient's current medications.  I encouraged the patient to continue to increase his activity without any limitations.  We discussed the role of long-term anticoagulation.  Based upon his age and history of hypertension he probably has a CHA2DS2-VASc score of 2.  Under the circumstances role of long-term anticoagulation is somewhat controversial.  However, I explained that as long as he continues to tolerate Eliquis without any problems or concerns it is reasonable to continue because of the relatively small but nonetheless significant risk of late recurrence of atrial fibrillation.  All of his questions have been addressed.  The patient will continue to follow-up with Dr. Radford Pax and be referred to the atrial fibrillation clinic for long-term surveillance following Maze procedure.  Patient will call and  return to our office in the future only should specific problems or questions arise.  I spent in excess of 15 minutes during the conduct of this office consultation and >50% of this time involved direct face-to-face encounter with the patient for counseling and/or coordination of their care.    Valentina Gu. Roxy Manns, MD 04/21/2019 11:00 AM

## 2019-04-28 DIAGNOSIS — Z23 Encounter for immunization: Secondary | ICD-10-CM | POA: Diagnosis not present

## 2019-05-09 DIAGNOSIS — L819 Disorder of pigmentation, unspecified: Secondary | ICD-10-CM | POA: Diagnosis not present

## 2019-05-09 DIAGNOSIS — L821 Other seborrheic keratosis: Secondary | ICD-10-CM | POA: Diagnosis not present

## 2019-05-09 DIAGNOSIS — L570 Actinic keratosis: Secondary | ICD-10-CM | POA: Diagnosis not present

## 2019-05-09 DIAGNOSIS — Z85828 Personal history of other malignant neoplasm of skin: Secondary | ICD-10-CM | POA: Diagnosis not present

## 2019-05-09 DIAGNOSIS — L814 Other melanin hyperpigmentation: Secondary | ICD-10-CM | POA: Diagnosis not present

## 2019-05-09 DIAGNOSIS — D229 Melanocytic nevi, unspecified: Secondary | ICD-10-CM | POA: Diagnosis not present

## 2019-05-09 DIAGNOSIS — D1801 Hemangioma of skin and subcutaneous tissue: Secondary | ICD-10-CM | POA: Diagnosis not present

## 2019-05-16 IMAGING — DX DG CHEST 2V
2 series · 2 of 2 positions shown · non-contrast
Comparison: Chest x-ray 05/01/2018

CLINICAL DATA: History minimally invasive mitral valve repair

EXAM:
CHEST - 2 VIEW

[dg chest 2 view (1 of 2)]
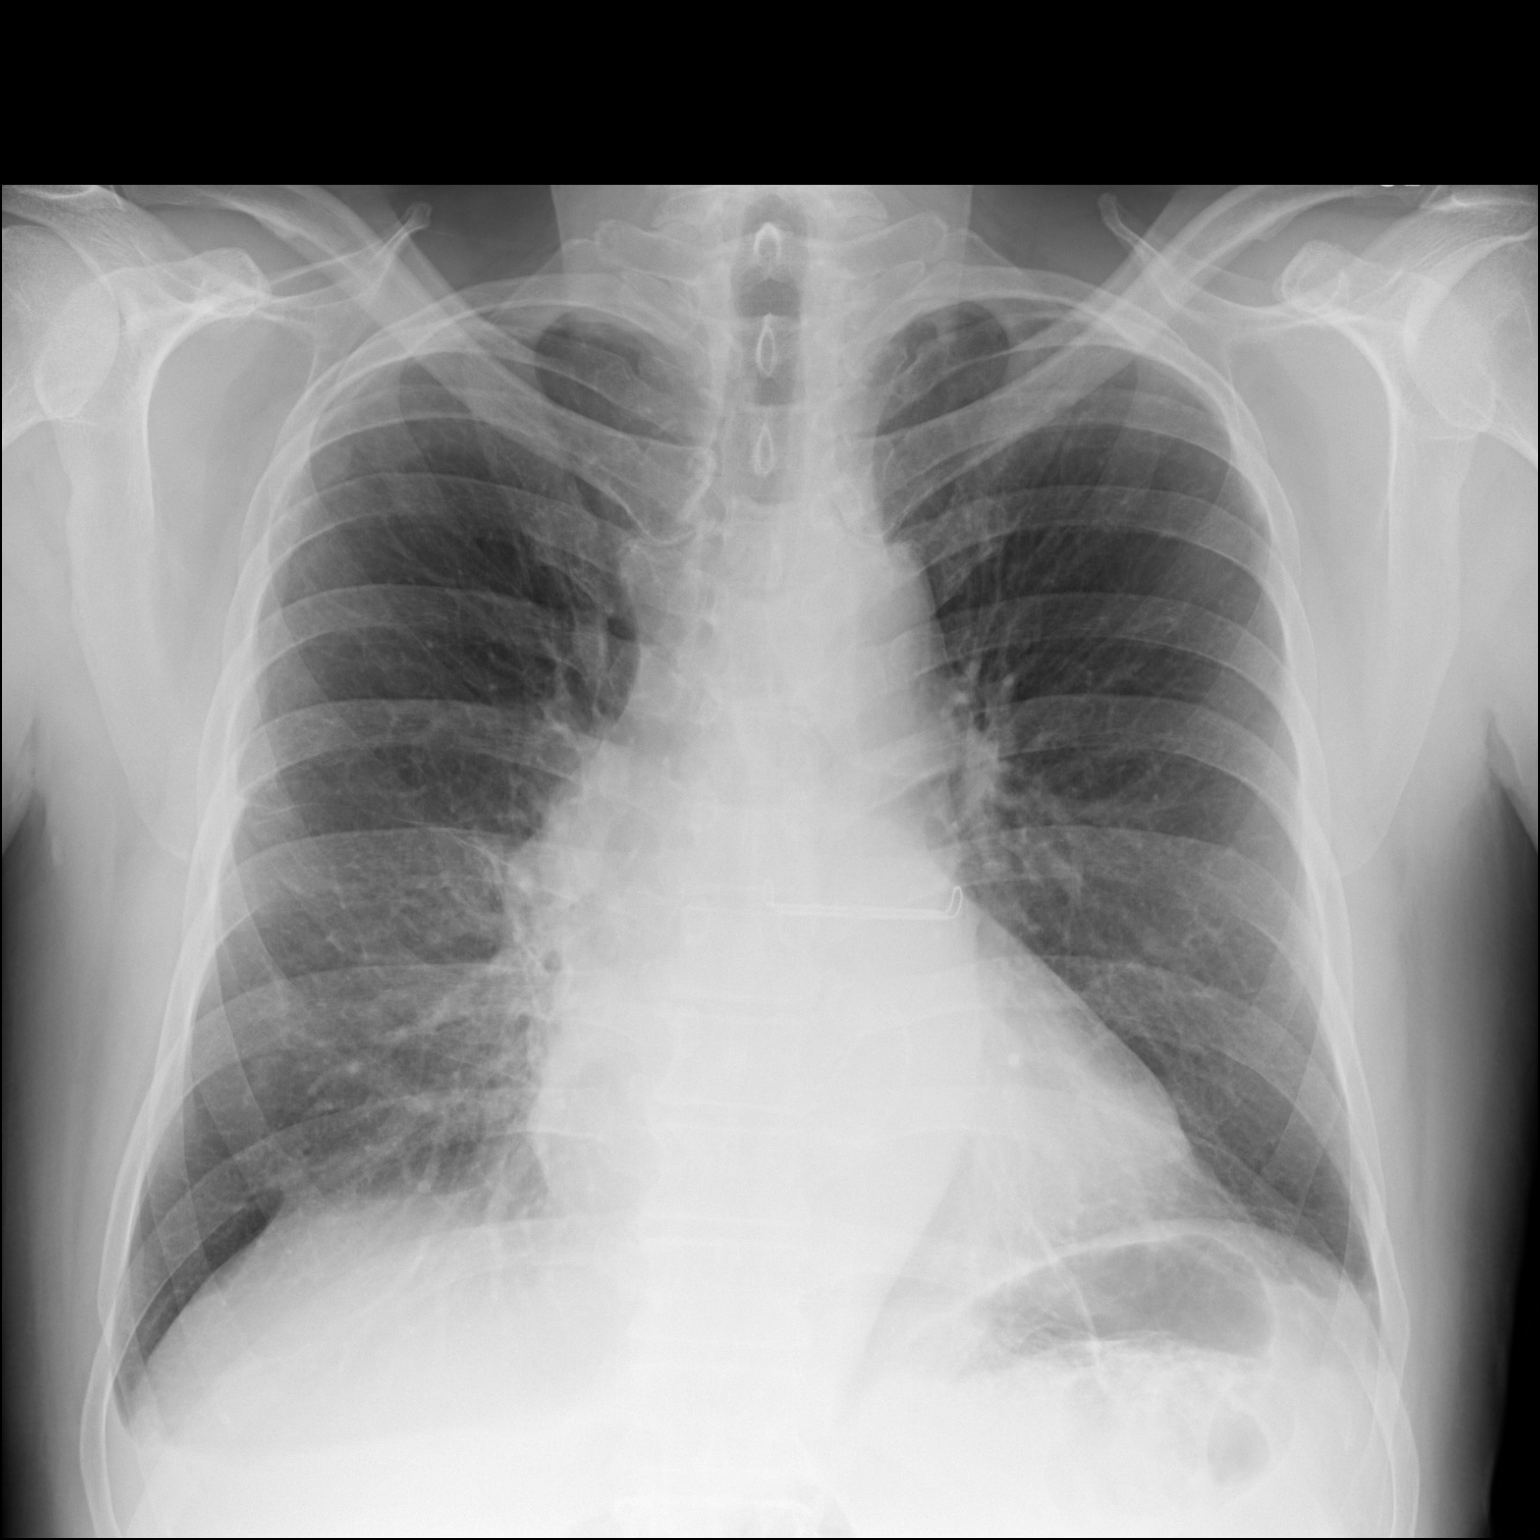

[dg chest 2 view (2 of 2)]
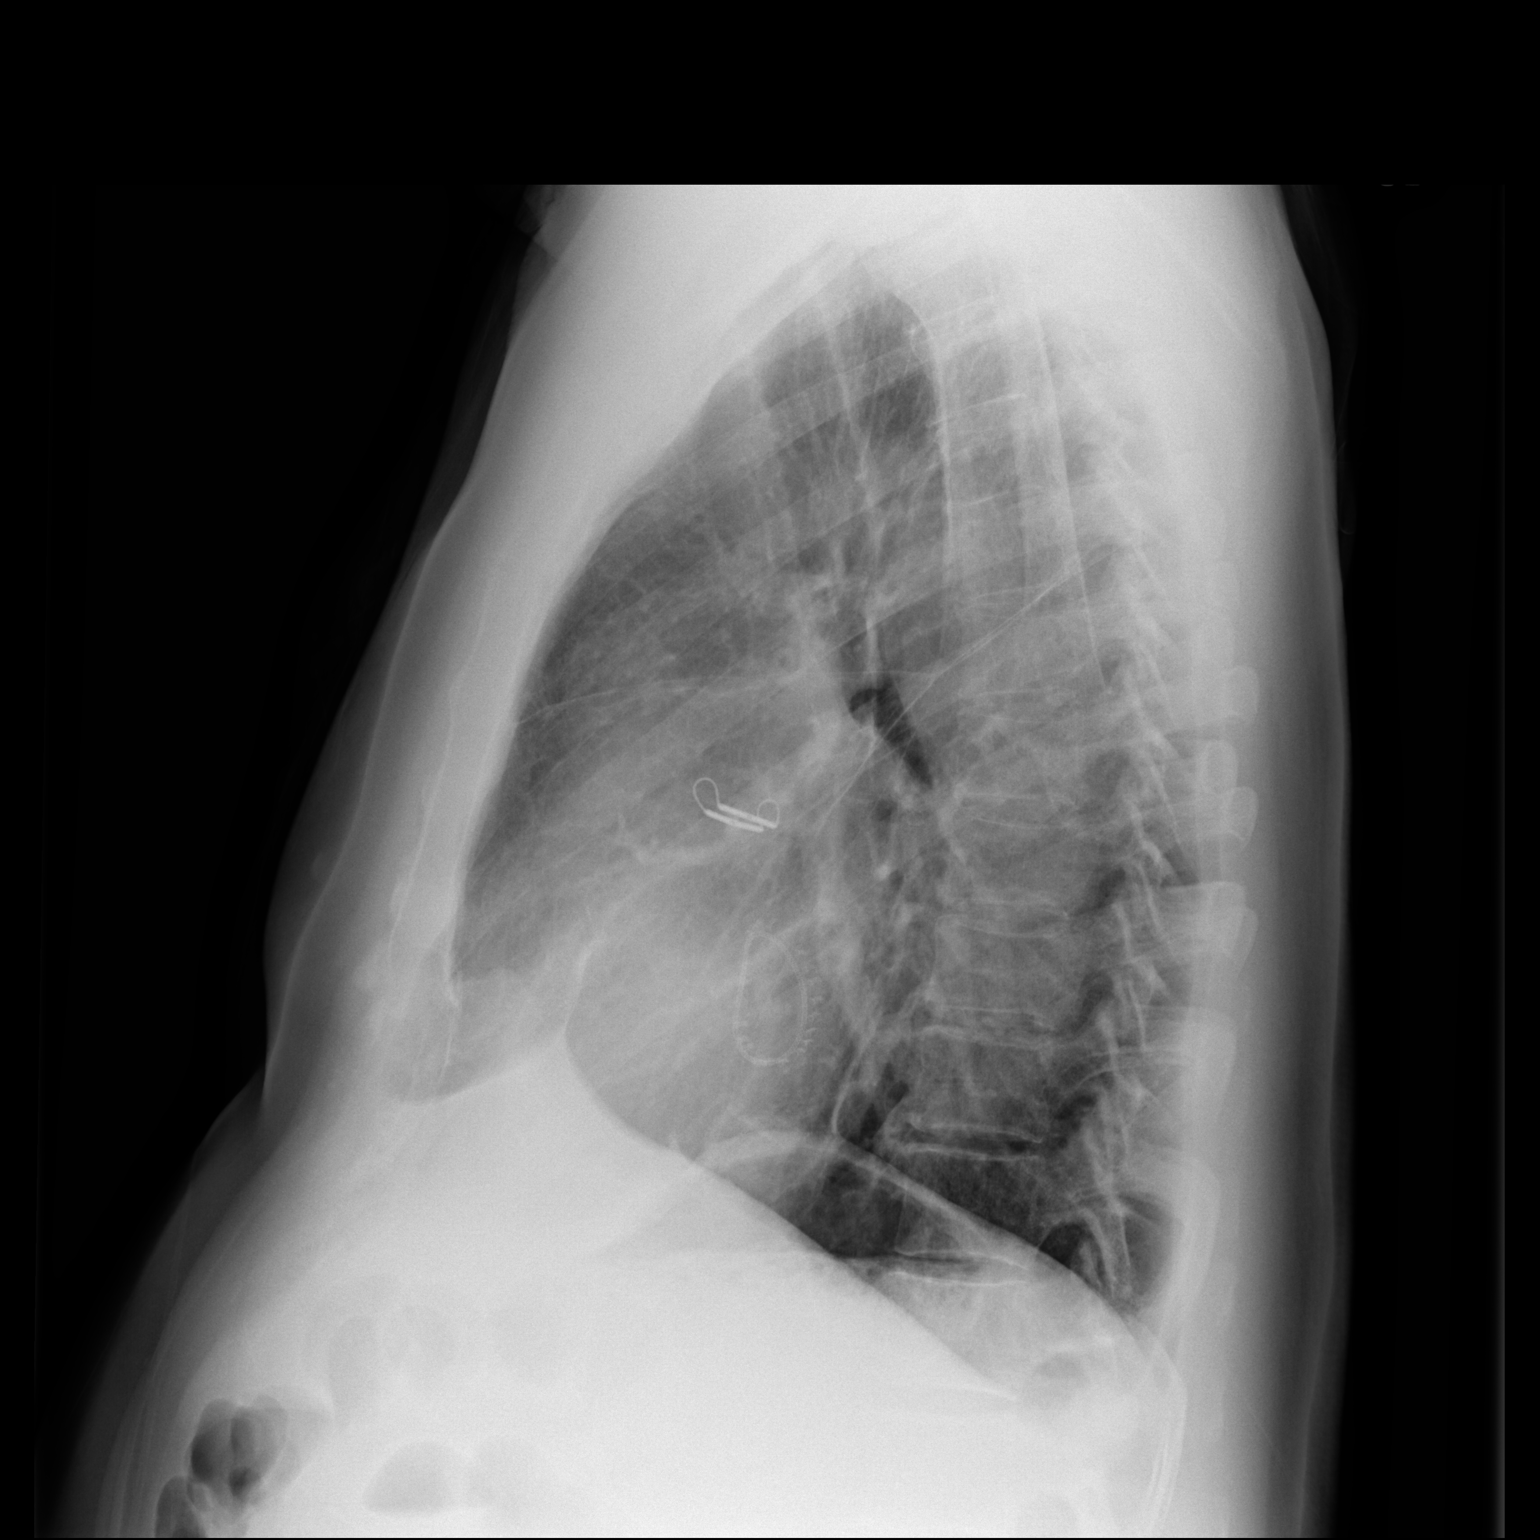

[2 of 2 positions shown; findings below may reference images not displayed]

FINDINGS: The previously noted edema pattern has cleared. No pleural effusion
is seen. Mediastinal and hilar contours are unchanged and
cardiomegaly is stable. Changes of mitral valve repair are noted
with left atrial appendage exclusion device also present. No bony
abnormality is seen.
IMPRESSION: Improved aeration with resolution of edema and small effusions.

## 2019-06-08 DIAGNOSIS — L578 Other skin changes due to chronic exposure to nonionizing radiation: Secondary | ICD-10-CM | POA: Diagnosis not present

## 2019-07-21 DIAGNOSIS — H5203 Hypermetropia, bilateral: Secondary | ICD-10-CM | POA: Diagnosis not present

## 2019-07-21 DIAGNOSIS — H25813 Combined forms of age-related cataract, bilateral: Secondary | ICD-10-CM | POA: Diagnosis not present

## 2019-07-21 DIAGNOSIS — H11001 Unspecified pterygium of right eye: Secondary | ICD-10-CM | POA: Diagnosis not present

## 2019-07-21 DIAGNOSIS — H1789 Other corneal scars and opacities: Secondary | ICD-10-CM | POA: Diagnosis not present

## 2019-08-02 DIAGNOSIS — L570 Actinic keratosis: Secondary | ICD-10-CM | POA: Diagnosis not present

## 2019-08-02 DIAGNOSIS — L905 Scar conditions and fibrosis of skin: Secondary | ICD-10-CM | POA: Diagnosis not present

## 2019-08-02 DIAGNOSIS — Z85828 Personal history of other malignant neoplasm of skin: Secondary | ICD-10-CM | POA: Diagnosis not present

## 2019-08-02 DIAGNOSIS — L819 Disorder of pigmentation, unspecified: Secondary | ICD-10-CM | POA: Diagnosis not present

## 2019-08-07 ENCOUNTER — Ambulatory Visit: Payer: Medicare Other

## 2019-08-23 ENCOUNTER — Ambulatory Visit: Payer: Medicare Other

## 2019-08-27 DIAGNOSIS — Z23 Encounter for immunization: Secondary | ICD-10-CM | POA: Diagnosis not present

## 2019-09-14 DIAGNOSIS — Z952 Presence of prosthetic heart valve: Secondary | ICD-10-CM | POA: Diagnosis not present

## 2019-09-14 DIAGNOSIS — I10 Essential (primary) hypertension: Secondary | ICD-10-CM | POA: Diagnosis not present

## 2019-09-14 DIAGNOSIS — Z1389 Encounter for screening for other disorder: Secondary | ICD-10-CM | POA: Diagnosis not present

## 2019-09-14 DIAGNOSIS — Z79899 Other long term (current) drug therapy: Secondary | ICD-10-CM | POA: Diagnosis not present

## 2019-09-14 DIAGNOSIS — Z125 Encounter for screening for malignant neoplasm of prostate: Secondary | ICD-10-CM | POA: Diagnosis not present

## 2019-09-14 DIAGNOSIS — Z0001 Encounter for general adult medical examination with abnormal findings: Secondary | ICD-10-CM | POA: Diagnosis not present

## 2019-09-14 DIAGNOSIS — E559 Vitamin D deficiency, unspecified: Secondary | ICD-10-CM | POA: Diagnosis not present

## 2019-09-14 DIAGNOSIS — I498 Other specified cardiac arrhythmias: Secondary | ICD-10-CM | POA: Diagnosis not present

## 2019-09-14 DIAGNOSIS — Z1211 Encounter for screening for malignant neoplasm of colon: Secondary | ICD-10-CM | POA: Diagnosis not present

## 2019-10-13 DIAGNOSIS — C44329 Squamous cell carcinoma of skin of other parts of face: Secondary | ICD-10-CM | POA: Diagnosis not present

## 2019-10-13 DIAGNOSIS — L570 Actinic keratosis: Secondary | ICD-10-CM | POA: Diagnosis not present

## 2019-10-13 DIAGNOSIS — Z85828 Personal history of other malignant neoplasm of skin: Secondary | ICD-10-CM | POA: Diagnosis not present

## 2019-10-13 DIAGNOSIS — D485 Neoplasm of uncertain behavior of skin: Secondary | ICD-10-CM | POA: Diagnosis not present

## 2019-11-14 DIAGNOSIS — I498 Other specified cardiac arrhythmias: Secondary | ICD-10-CM | POA: Diagnosis not present

## 2019-11-14 DIAGNOSIS — R3915 Urgency of urination: Secondary | ICD-10-CM | POA: Diagnosis not present

## 2019-11-14 DIAGNOSIS — Z952 Presence of prosthetic heart valve: Secondary | ICD-10-CM | POA: Diagnosis not present

## 2019-11-14 DIAGNOSIS — I10 Essential (primary) hypertension: Secondary | ICD-10-CM | POA: Diagnosis not present

## 2020-02-01 ENCOUNTER — Other Ambulatory Visit: Payer: Self-pay | Admitting: Physician Assistant

## 2020-04-04 ENCOUNTER — Ambulatory Visit (HOSPITAL_COMMUNITY): Payer: Medicare Other | Attending: Cardiovascular Disease

## 2020-04-04 ENCOUNTER — Other Ambulatory Visit: Payer: Self-pay

## 2020-04-04 DIAGNOSIS — I7121 Aneurysm of the ascending aorta, without rupture: Secondary | ICD-10-CM

## 2020-04-04 DIAGNOSIS — I712 Thoracic aortic aneurysm, without rupture: Secondary | ICD-10-CM

## 2020-04-04 DIAGNOSIS — I340 Nonrheumatic mitral (valve) insufficiency: Secondary | ICD-10-CM | POA: Diagnosis not present

## 2020-04-04 DIAGNOSIS — I480 Paroxysmal atrial fibrillation: Secondary | ICD-10-CM | POA: Diagnosis not present

## 2020-04-04 LAB — ECHOCARDIOGRAM COMPLETE
Area-P 1/2: 2.12 cm2
MV VTI: 2.05 cm2
S' Lateral: 3.1 cm

## 2020-04-16 ENCOUNTER — Ambulatory Visit (HOSPITAL_COMMUNITY)
Admission: RE | Admit: 2020-04-16 | Discharge: 2020-04-16 | Disposition: A | Discharge status: Home / Self Care | Payer: Medicare Other | Source: Ambulatory Visit | Attending: Physician Assistant | Admitting: Physician Assistant

## 2020-04-16 ENCOUNTER — Other Ambulatory Visit: Payer: Self-pay

## 2020-04-16 VITALS — BP 120/86 | HR 73 | Ht 72.0 in | Wt 202.8 lb

## 2020-04-16 DIAGNOSIS — Z952 Presence of prosthetic heart valve: Secondary | ICD-10-CM | POA: Insufficient documentation

## 2020-04-16 DIAGNOSIS — D6869 Other thrombophilia: Secondary | ICD-10-CM | POA: Insufficient documentation

## 2020-04-16 DIAGNOSIS — I480 Paroxysmal atrial fibrillation: Secondary | ICD-10-CM | POA: Insufficient documentation

## 2020-04-16 DIAGNOSIS — Z882 Allergy status to sulfonamides status: Secondary | ICD-10-CM | POA: Insufficient documentation

## 2020-04-16 DIAGNOSIS — Z79899 Other long term (current) drug therapy: Secondary | ICD-10-CM | POA: Insufficient documentation

## 2020-04-16 DIAGNOSIS — Z7901 Long term (current) use of anticoagulants: Secondary | ICD-10-CM | POA: Diagnosis not present

## 2020-04-16 DIAGNOSIS — I341 Nonrheumatic mitral (valve) prolapse: Secondary | ICD-10-CM | POA: Insufficient documentation

## 2020-04-16 DIAGNOSIS — Z888 Allergy status to other drugs, medicaments and biological substances status: Secondary | ICD-10-CM | POA: Insufficient documentation

## 2020-04-16 DIAGNOSIS — I10 Essential (primary) hypertension: Secondary | ICD-10-CM | POA: Diagnosis not present

## 2020-04-16 DIAGNOSIS — N400 Enlarged prostate without lower urinary tract symptoms: Secondary | ICD-10-CM | POA: Diagnosis not present

## 2020-04-16 NOTE — Addendum Note (Signed)
Encounter addended by: Juluis Mire, RN on: 04/16/2020 4:09 PM  Actions taken: Flowsheet accepted

## 2020-04-16 NOTE — Progress Notes (Signed)
Primary Care Physician: Josetta Huddle, MD Primary Cardiologist: Dr Radford Pax Primary Electrophysiologist: none Referring Physician: Dr Launa Grill. is a 74 y.o. male with a history of HTN, MVP with MR s/p minimally invasive repair with MAZE and LAA clipping 04/27/18, paroxysmal atrial fibrillation who presents for consultation in the [facility]. Patient is on Eliquis for a CHADS2VASC score of 2. Patient reports he has done well over the last year. He does have brief episodes of palpitations when he first lays down at night but these are not bothersome for him. He denies any other associated symptoms. He denies any bleeding issues on anticoagulation.   Today, he denies symptoms of chest pain, shortness of breath, orthopnea, PND, lower extremity edema, dizziness, presyncope, syncope, snoring, daytime somnolence, bleeding, or neurologic sequela. The patient is tolerating medications without difficulties and is otherwise without complaint today.    Atrial Fibrillation Risk Factors:  he does not have symptoms or diagnosis of sleep apnea. he does not have a history of rheumatic fever.  he has a BMI of Body mass index is 27.5 kg/m.Marland Kitchen Filed Weights   04/16/20 1351  Weight: 92 kg    Family History  Problem Relation Age of Onset   Cancer Mother        breast   Cancer Father        pancreatic   Heart failure Father    Cancer Maternal Aunt        ? ovarian   Cancer Maternal Uncle        ? pancreatic   Cancer Paternal Uncle        ? lung     Atrial Fibrillation Management history:  Previous antiarrhythmic drugs: none Previous cardioversions: none Previous ablations: MAZE 2019 CHADS2VASC score: 2 Anticoagulation history: Eliquis   Past Medical History:  Diagnosis Date   Abdominal mass, RLQ (right lower quadrant) 07/27/2012   Ascending aortic aneurysm ([place])    30mm by echo 02/2018 but normal by Chest CTA   Benign essential HTN  02/07/2015   BPH (benign prostatic hyperplasia)    mild   ED (erectile dysfunction) 08/2011   cancelled f/u with Urology   Epididymitis    recurrent   Finger laceration    left index finger   Hyperlipidemia    Hypertension    in the past - resolved   Left ventricular diastolic dysfunction, NYHA class 2    Meckel's diverticulitis 08/27/2012   MVP (mitral valve prolapse)    Bileaflet MVP with partially flail segment of the posterior leaflt with 4+ MR in setting of strep viridans bacteremia s/p minimally invasive MV repari   PAF (paroxysmal atrial fibrillation) ([place]) 05/13/2017   CHADS2VASC score is 2 (HTN and age > 61) on Apixaban   S/P minimally invasive maze operation for atrial fibrillation 04/27/2018   Complete bilateral atrial lesion set using cryothermy and bipolar radiofrequency ablation with clipping of LA appendage via right mini thoracotomy approach   S/P minimally invasive mitral valve repair 04/27/2018   Complex valvuloplasty including triangular resection of P2 segment of posterior leaflet, artificial Gore-tex neochord placement x8 and Sorin Memo 4D ring annulopasty via right mini thoracotomy approach   Vertigo    episodic mild positional   Past Surgical History:  Procedure Laterality Date   APPENDECTOMY     CARDIAC CATHETERIZATION  03/24/2018   CLIPPING OF ATRIAL APPENDAGE  04/27/2018   Procedure: CLIPPING OF ATRIAL APPENDAGE;  Surgeon: Rexene Alberts,  MD;  Location: [place];  Service: Open Heart Surgery;;   LAPAROSCOPIC APPENDECTOMY N/A 08/11/2012   Procedure: APPENDECTOMY LAPAROSCOPIC;  Surgeon: Stark Klein, MD;  Location: [facility];  Service: General;  Laterality: N/A;   LAPAROSCOPIC SMALL BOWEL RESECTION N/A 08/11/2012   Procedure: Diagnostic Laparoscopy,  Diverticulectomy and laparoscopic appendectomy;  Surgeon: Stark Klein, MD;  Location: [facility];  Service: General;  Laterality: N/A;  Diagnostic Laparoscopy, Small Bowel Resection vs Diverticulectomy     MINIMALLY INVASIVE MAZE PROCEDURE N/A 04/27/2018   Procedure: MINIMALLY INVASIVE MAZE PROCEDURE;  Surgeon: Rexene Alberts, MD;  Location: [place];  Service: Open Heart Surgery;  Laterality: N/A;   MITRAL VALVE REPAIR Right 04/27/2018   Procedure: MINIMALLY INVASIVE MITRAL VALVE REPAIR (MVR);  Surgeon: Rexene Alberts, MD;  Location: [place];  Service: Open Heart Surgery;  Laterality: Right;   NERVE, TENDON AND ARTERY REPAIR Left 10/28/2018   Procedure: TENDON, ARTERY, NERVE REPAIR LEFT INDEX FINGER, DEBRIDEMENT OF WOUND;  Surgeon: Daryll Brod, MD;  Location: [place];  Service: Orthopedics;  Laterality: Left;   PILONIDAL CYST EXCISION     RECONSTRUCTION OF NOSE     secondary to deviated septum   RIGHT/LEFT HEART CATH AND CORONARY ANGIOGRAPHY N/A 03/24/2018   Procedure: RIGHT/LEFT HEART CATH AND CORONARY ANGIOGRAPHY;  Surgeon: Jettie Booze, MD;  Location: [place] [facility];  Service: Cardiovascular;  Laterality: N/A;   TEE WITHOUT CARDIOVERSION N/A 08/08/2013   Procedure: TRANSESOPHAGEAL ECHOCARDIOGRAM (TEE);  Surgeon: Sueanne Margarita, MD;  Location: [facility] END[facility];  Service: Cardiovascular;  Laterality: N/A;   TEE WITHOUT CARDIOVERSION N/A 01/05/2018   Procedure: TRANSESOPHAGEAL ECHOCARDIOGRAM (TEE);  Surgeon: Lelon Perla, MD;  [facility] END[facility]COPY;  Service: Cardiovascular;  Laterality: N/A;   TEE WITHOUT CARDIOVERSION N/A 04/27/2018   Procedure: TRANSESOPHAGEAL ECHOCARDIOGRAM (TEE);  Surgeon: Rexene Alberts, MD;  Location: [place];  Service: Open Heart Surgery;  Laterality: N/A;   TONSILLECTOMY      Current Outpatient Medications  Medication Sig Dispense Refill   acetaminophen (TYLENOL) 500 MG tablet Take 500-1,000 mg by mouth as needed for moderate pain or headache.      amoxicillin (AMOXIL) 500 MG capsule TAKE FOUR CAPSULES BY MOUTH 30-60 minutes prior TO dental procedures 4 capsule 1   apixaban (ELIQUIS) 5 MG TABS tablet Take 1 tablet (5 mg total) by  mouth 2 (two) times daily. 60 tablet    carvedilol (COREG) 3.125 MG tablet Take 1 tablet (3.125 mg total) by mouth 2 (two) times daily. Please schedule appt for future refills. 1st attempt. 180 tablet 0   Cholecalciferol (VITAMIN D) 2000 UNITS tablet Take 2,000 Units daily by mouth.      Coenzyme Q10 (CO Q-10 PO) Take 1 capsule by mouth daily.     Cyanocobalamin (VITAMIN B12) 500 MCG TABS Take 500 mcg by mouth daily. Take for 5 days only.     flunisolide (NASALIDE) 25 MCG/ACT (0.025%) SOLN Place 2 sprays into the nose daily as needed (for allergies).      losartan (COZAAR) 25 MG tablet Take 25 mg by mouth daily.      Polyvinyl Alcohol-Povidone PF (REFRESH) 1.4-0.6 % SOLN Place 1 drop into both eyes as needed (for dry eyes).      rosuvastatin (CRESTOR) 5 MG tablet Take 5 mg by mouth every Monday, Wednesday, and Friday.   3   No current facility-administered medications for this encounter.    Allergies  Allergen Reactions   Ace Inhibitors Cough  Sulfa Antibiotics Other (See Comments)    Severe lethargy   Antihistamines, Chlorpheniramine-Type Other (See Comments)    Trouble urinating. Patient st "all antihistamines" cause this.     Social History   Socioeconomic History   Marital status: Married    Spouse name: Not on file   Number of children: Not on file   Years of education: Not on file   Highest education level: Not on file  Occupational History   Not on file  Tobacco Use   Smoking status: Never Smoker   Smokeless tobacco: Never Used  Vaping Use   Vaping Use: Never used  Substance and Sexual Activity   Alcohol use: Yes    Comment: one drink per day (wine or beer)   Drug use: No   Sexual activity: Yes  Other Topics Concern   Not on file  Social History Narrative   Not on file   Social Determinants of Health   Financial Resource Strain:    Difficulty of Paying Living Expenses: Not on file  Food Insecurity:    Worried About [place] in the Last Year: Not on file   Ran Out of Food in the Last Year: Not on file  Transportation Needs:    Lack of Transportation (Medical): Not on file   Lack of Transportation (Non-Medical): Not on file  Physical Activity:    Days of Exercise per Week: Not on file   Minutes of Exercise per Session: Not on file  Stress:    Feeling of Stress : Not on file  Social Connections:    Frequency of Communication with Friends and Family: Not on file   Frequency of Social Gatherings with Friends and Family: Not on file   Attends Religious Services: Not on file   Active Member of Clubs or Organizations: Not on file   Attends Archivist Meetings: Not on file   Marital Status: Not on file  Intimate Partner Violence:    Fear of Current or Ex-Partner: Not on file   Emotionally Abused: Not on file   Physically Abused: Not on file   Sexually Abused: Not on file     ROS- All systems are reviewed and negative except as per the HPI above.  Physical Exam: Vitals:   04/16/20 1351  BP: 120/86  Pulse: 73  Weight: 92 kg  Height: 6' (1.829 m)    GEN- The patient is well appearing, alert and oriented x 3 today.   Head- normocephalic, atraumatic Eyes-  Sclera clear, conjunctiva pink Ears- hearing intact Oropharynx- clear Neck- supple  Lungs- Clear to ausculation bilaterally, normal work of breathing Heart- slightly irregular rate and rhythm, no murmurs, rubs or gallops  GI- soft, NT, ND, + BS Extremities- no clubbing, cyanosis, or edema MS- no significant deformity or atrophy Skin- no rash or lesion Psych- euthymic mood, full affect Neuro- strength and sensation are intact  Wt Readings from Last 3 Encounters:  04/16/20 92 kg  04/21/19 95.3 kg  04/18/19 95.3 kg    EKG today demonstrates SR HR 73, blocked PAC vs sinus arrhythmia, PR 198, QRS 92, QTc 434  Echo 04/04/20 demonstrated  1. There is an echolucent structure in the liver. Would recommend  dedicated  liver ultrasound for better characaterization.  2. Left ventricular ejection fraction, by estimation, is 60 to 65%. The  left ventricle has normal function. The left ventricle has no regional  wall motion abnormalities. Left ventricular diastolic function could not  be evaluated.  3. Right ventricular systolic function is mildly reduced. The right  ventricular size is normal. Tricuspid regurgitation signal is inadequate  for assessing PA pressure.  4. The mitral valve has been repaired/replaced. No evidence of mitral  valve regurgitation. No evidence of mitral stenosis. The mean mitral valve  gradient is 3.1 mmHg with average heart rate of 67 bpm. There is a 6mm  Sorin Memo 4D Ring annuloplasty  present in the mitral position. Procedure Date: 04/27/2018. Echo findings  are consistent with normal structure and function of the mitral valve  prosthesis.  5. The aortic valve is tricuspid. Aortic valve regurgitation is not  visualized. No aortic stenosis is present.   Comparison(s): No significant change from prior study. 01/07/19 EF 60-65%.   Epic records are reviewed at length today  CHA2DS2-VASc Score = 2  The patient's score is based upon: CHF History: 0 HTN History: 1 Diabetes History: 0 Stroke History: 0 Vascular Disease History: 0 Age Score: 1 Gender Score: 0      ASSESSMENT AND PLAN: 1. Paroxysmal Atrial Fibrillation (ICD10:  I48.0) The patient's CHA2DS2-VASc score is 2, indicating a 2.2% annual risk of stroke.   S/p MAZE and LAA clipping 2019 Patient appears to be maintaining SR. Continue Eliquis 5 mg BID  2. Secondary Hypercoagulable State (ICD10:  D68.69) The patient is at significant risk for stroke/thromboembolism based upon his CHA2DS2-VASc Score of 2.  Continue Apixaban (Eliquis).   3. HTN Stable, no changes today.  4. MVP Severe MR with flail leaflet. S/p minimally invasive repair 2019.   Follow up in the AF clinic in one year.    [facility] [address] [place], [place] [address] [phone] 04/16/2020 2:23 PM

## 2020-04-19 DIAGNOSIS — M179 Osteoarthritis of knee, unspecified: Secondary | ICD-10-CM | POA: Diagnosis not present

## 2020-04-19 DIAGNOSIS — E785 Hyperlipidemia, unspecified: Secondary | ICD-10-CM | POA: Diagnosis not present

## 2020-04-19 DIAGNOSIS — I4891 Unspecified atrial fibrillation: Secondary | ICD-10-CM | POA: Diagnosis not present

## 2020-04-19 DIAGNOSIS — I10 Essential (primary) hypertension: Secondary | ICD-10-CM | POA: Diagnosis not present

## 2020-04-25 DIAGNOSIS — Z23 Encounter for immunization: Secondary | ICD-10-CM | POA: Diagnosis not present

## 2020-05-08 ENCOUNTER — Other Ambulatory Visit: Payer: Self-pay | Admitting: Cardiology

## 2020-05-09 DIAGNOSIS — M179 Osteoarthritis of knee, unspecified: Secondary | ICD-10-CM | POA: Diagnosis not present

## 2020-05-09 DIAGNOSIS — E785 Hyperlipidemia, unspecified: Secondary | ICD-10-CM | POA: Diagnosis not present

## 2020-05-09 DIAGNOSIS — I4891 Unspecified atrial fibrillation: Secondary | ICD-10-CM | POA: Diagnosis not present

## 2020-05-09 DIAGNOSIS — I10 Essential (primary) hypertension: Secondary | ICD-10-CM | POA: Diagnosis not present

## 2020-06-05 DIAGNOSIS — L570 Actinic keratosis: Secondary | ICD-10-CM | POA: Diagnosis not present

## 2020-06-05 DIAGNOSIS — L821 Other seborrheic keratosis: Secondary | ICD-10-CM | POA: Diagnosis not present

## 2020-06-05 DIAGNOSIS — Z85828 Personal history of other malignant neoplasm of skin: Secondary | ICD-10-CM | POA: Diagnosis not present

## 2020-06-13 ENCOUNTER — Other Ambulatory Visit: Payer: Self-pay | Admitting: Cardiology

## 2020-06-27 ENCOUNTER — Other Ambulatory Visit: Payer: Self-pay | Admitting: Cardiology

## 2020-06-27 ENCOUNTER — Other Ambulatory Visit: Payer: Self-pay

## 2020-06-27 MED ORDER — ROSUVASTATIN CALCIUM 5 MG PO TABS: 5.0000 mg | ORAL_TABLET | ORAL | 0 refills | Status: DC

## 2020-06-27 MED ORDER — CARVEDILOL 3.125 MG PO TABS: 3.1250 mg | ORAL_TABLET | Freq: Two times a day (BID) | ORAL | 0 refills | Status: DC

## 2020-07-12 ENCOUNTER — Ambulatory Visit: Payer: Medicare Other | Admitting: Cardiology

## 2020-07-20 ENCOUNTER — Ambulatory Visit: Payer: Medicare PPO | Admitting: Cardiology

## 2020-07-20 ENCOUNTER — Other Ambulatory Visit: Payer: Self-pay

## 2020-07-20 ENCOUNTER — Encounter: Payer: Self-pay | Admitting: Cardiology

## 2020-07-20 VITALS — BP 110/82 | HR 84 | Ht 72.0 in | Wt 200.0 lb

## 2020-07-20 DIAGNOSIS — I10 Essential (primary) hypertension: Secondary | ICD-10-CM | POA: Diagnosis not present

## 2020-07-20 DIAGNOSIS — I7121 Aneurysm of the ascending aorta, without rupture: Secondary | ICD-10-CM

## 2020-07-20 DIAGNOSIS — E7800 Pure hypercholesterolemia, unspecified: Secondary | ICD-10-CM

## 2020-07-20 DIAGNOSIS — R001 Bradycardia, unspecified: Secondary | ICD-10-CM

## 2020-07-20 DIAGNOSIS — I712 Thoracic aortic aneurysm, without rupture: Secondary | ICD-10-CM | POA: Diagnosis not present

## 2020-07-20 DIAGNOSIS — I480 Paroxysmal atrial fibrillation: Secondary | ICD-10-CM

## 2020-07-20 DIAGNOSIS — I341 Nonrheumatic mitral (valve) prolapse: Secondary | ICD-10-CM

## 2020-07-20 LAB — CBC
Hematocrit: 49.7 % (ref 37.5–51.0)
Hemoglobin: 16.9 g/dL (ref 13.0–17.7)
MCH: 31.5 pg (ref 26.6–33.0)
MCHC: 34 g/dL (ref 31.5–35.7)
MCV: 93 fL (ref 79–97)
Platelets: 270 10*3/uL (ref 150–450)
RBC: 5.37 x10E6/uL (ref 4.14–5.80)
RDW: 12.2 % (ref 11.6–15.4)
WBC: 7.2 10*3/uL (ref 3.4–10.8)

## 2020-07-20 LAB — LIPID PANEL
Chol/HDL Ratio: 3.2 ratio (ref 0.0–5.0)
Cholesterol, Total: 154 mg/dL (ref 100–199)
HDL: 48 mg/dL (ref >39)
LDL Chol Calc (NIH): 86 mg/dL (ref 0–99)
Triglycerides: 113 mg/dL (ref 0–149)
VLDL Cholesterol Cal: 20 mg/dL (ref 5–40)

## 2020-07-20 LAB — COMPREHENSIVE METABOLIC PANEL
ALT: 10 IU/L (ref 0–44)
AST: 18 IU/L (ref 0–40)
Albumin/Globulin Ratio: 2 (ref 1.2–2.2)
Albumin: 4.4 g/dL (ref 3.7–4.7)
Alkaline Phosphatase: 72 IU/L (ref 44–121)
BUN/Creatinine Ratio: 14 (ref 10–24)
BUN: 14 mg/dL (ref 8–27)
Bilirubin Total: 0.9 mg/dL (ref 0.0–1.2)
CO2: 22 mmol/L (ref 20–29)
Calcium: 9.4 mg/dL (ref 8.6–10.2)
Chloride: 102 mmol/L (ref 96–106)
Creatinine, Ser: 0.99 mg/dL (ref 0.76–1.27)
GFR calc Af Amer: 88 mL/min/{1.73_m2} (ref >59)
GFR calc non Af Amer: 76 mL/min/{1.73_m2} (ref >59)
Globulin, Total: 2.2 g/dL (ref 1.5–4.5)
Glucose: 88 mg/dL (ref 65–99)
Potassium: 4.6 mmol/L (ref 3.5–5.2)
Sodium: 138 mmol/L (ref 134–144)
Total Protein: 6.6 g/dL (ref 6.0–8.5)

## 2020-07-20 NOTE — Progress Notes (Addendum)
Cardiology Office Note:    Date:  07/20/2020   ID:  Chase Martin., DOB Jul 03, 1945, MRN [id number]  PCP:  Josetta Huddle, MD  Cardiologist:  Fransico Him, MD    Referring MD: Josetta Huddle, MD   Chief Complaint  Patient presents with   Mitral Regurgitation   Hypertension   Atrial Fibrillation    History of Present Illness:    Chase Haggart. is a 75 y.o. male with a hx of bileaflet mitral valve prolapse with mildMR, dilated aortic root,grade 2 diastolic dysfunction and PAF. He is onEliquis for a CHADS2VASC score of 4.He was admitted with strep viridans bacteremia in July 2019. 2D echo7/2019showed normal LVF with EF60-65% withsevereholosystolic MVP of the posterior MV leaflet with severeMR that was eccentric.Due to bacteremia patient underwent TEE which confirmed severe mitral prolapse of the PMLV with severe MR. There was also an oscillating density that was felt to possibly be due to a ruptured cord but could not rule out small vegetation and patient was treated with a prolonged course of antibx for endocarditis.   Followup 2D echo 02/2018 showed normal LVF with flail segment of posterior MVL with moderate to severe anteriorly directed MR.  He was referred to structural heart team and underwent right and left heart cath showing normal coronary arteries and severe MR and underwent minimally invasive MV repair and MAZE procedure with LA appendage clipping 04/27/2018.  He has been maintaining NSR since then and is on Eliquis 5mg  BID for CHADS2VASC score of 2.  He is here today for followup and is doing well.  He denies any chest pain or pressure, SOB, DOE, PND, orthopnea, LE edema, dizziness, palpitations (he is more aware of heart beat at night but no racing or skipping) or syncope. He is compliant with his meds and is tolerating meds with no SE.    Past Medical History:  Diagnosis Date   Abdominal mass, RLQ (right lower quadrant) 07/27/2012   Acquired dilation  of ascending aorta and aortic root ([facility])    19mm ascending aorta and 59mm aortic root by echo 03/2020   Benign essential HTN 02/07/2015   BPH (benign prostatic hyperplasia)    mild   ED (erectile dysfunction) 08/2011   cancelled f/u with Urology   Epididymitis    recurrent   Finger laceration    left index finger   Hyperlipidemia    Hypertension    in the past - resolved   Left ventricular diastolic dysfunction, NYHA class 2    Meckel's diverticulitis 08/27/2012   MVP (mitral valve prolapse)    Bileaflet MVP with partially flail segment of the posterior leaflt with 4+ MR in setting of strep viridans bacteremia s/p minimally invasive MV repari   PAF (paroxysmal atrial fibrillation) ([place]) 05/13/2017   CHADS2VASC score is 2 (HTN and age > 35) on Apixaban   S/P minimally invasive maze operation for atrial fibrillation 04/27/2018   Complete bilateral atrial lesion set using cryothermy and bipolar radiofrequency ablation with clipping of LA appendage via right mini thoracotomy approach   S/P minimally invasive mitral valve repair 04/27/2018   Complex valvuloplasty including triangular resection of P2 segment of posterior leaflet, artificial Gore-tex neochord placement x8 and Sorin Memo 4D ring annulopasty via right mini thoracotomy approach   Vertigo    episodic mild positional    Past Surgical History:  Procedure Laterality Date   APPENDECTOMY     CARDIAC CATHETERIZATION  03/24/2018   CLIPPING OF ATRIAL APPENDAGE  04/27/2018  Procedure: CLIPPING OF ATRIAL APPENDAGE;  Surgeon: Rexene Alberts, MD;  Location: [place];  Service: Open Heart Surgery;;   LAPAROSCOPIC APPENDECTOMY N/A 08/11/2012   Procedure: APPENDECTOMY LAPAROSCOPIC;  Surgeon: Stark Klein, MD;  [facility];  Service: General;  Laterality: N/A;   LAPAROSCOPIC SMALL BOWEL RESECTION N/A 08/11/2012   Procedure: Diagnostic Laparoscopy,  Diverticulectomy and laparoscopic appendectomy;  Surgeon: Stark Klein,  MD;  [facility];  Service: General;  Laterality: N/A;  Diagnostic Laparoscopy, Small Bowel Resection vs Diverticulectomy    MINIMALLY INVASIVE MAZE PROCEDURE N/A 04/27/2018   Procedure: MINIMALLY INVASIVE MAZE PROCEDURE;  Surgeon: Rexene Alberts, MD;  Location: [place];  Service: Open Heart Surgery;  Laterality: N/A;   MITRAL VALVE REPAIR Right 04/27/2018   Procedure: MINIMALLY INVASIVE MITRAL VALVE REPAIR (MVR);  Surgeon: Rexene Alberts, MD;  Location: [place];  Service: Open Heart Surgery;  Laterality: Right;   NERVE, TENDON AND ARTERY REPAIR Left 10/28/2018   Procedure: TENDON, ARTERY, NERVE REPAIR LEFT INDEX FINGER, DEBRIDEMENT OF WOUND;  Surgeon: Daryll Brod, MD;  Location: [place];  Service: Orthopedics;  Laterality: Left;   PILONIDAL CYST EXCISION     RECONSTRUCTION OF NOSE     secondary to deviated septum   RIGHT/LEFT HEART CATH AND CORONARY ANGIOGRAPHY N/A 03/24/2018   Procedure: RIGHT/LEFT HEART CATH AND CORONARY ANGIOGRAPHY;  Surgeon: Jettie Booze, MD;  Location: [place] [facility];  Service: Cardiovascular;  Laterality: N/A;   TEE WITHOUT CARDIOVERSION N/A 08/08/2013   Procedure: TRANSESOPHAGEAL ECHOCARDIOGRAM (TEE);  Surgeon: Sueanne Margarita, MD;  Location: [facility] ENDOSCOPY;  Service: Cardiovascular;  Laterality: N/A;   TEE WITHOUT CARDIOVERSION N/A 01/05/2018   Procedure: TRANSESOPHAGEAL ECHOCARDIOGRAM (TEE);  Surgeon: Lelon Perla, MD;  Location: [facility] ENDOSCOPY;  Service: Cardiovascular;  Laterality: N/A;   TEE WITHOUT CARDIOVERSION N/A 04/27/2018   Procedure: TRANSESOPHAGEAL ECHOCARDIOGRAM (TEE);  Surgeon: Rexene Alberts, MD;  Location: [place];  Service: Open Heart Surgery;  Laterality: N/A;   TONSILLECTOMY      Current Medications: Current Meds  Medication Sig   acetaminophen (TYLENOL) 500 MG tablet Take 500-1,000 mg by mouth as needed for moderate pain or headache.    amoxicillin (AMOXIL) 500 MG capsule TAKE FOUR CAPSULES BY MOUTH 30-60  minutes prior TO dental procedures   apixaban (ELIQUIS) 5 MG TABS tablet Take 1 tablet (5 mg total) by mouth 2 (two) times daily.   carvedilol (COREG) 3.125 MG tablet Take 1 tablet (3.125 mg total) by mouth 2 (two) times daily with a meal.   Cholecalciferol (VITAMIN D) 2000 UNITS tablet Take 2,000 Units daily by mouth.    Coenzyme Q10 (CO Q-10 PO) Take 1 capsule by mouth daily.   Cyanocobalamin (VITAMIN B12) 500 MCG TABS Take 500 mcg by mouth daily. Take for 5 days only.   flunisolide (NASALIDE) 25 MCG/ACT (0.025%) SOLN Place 2 sprays into the nose daily as needed (for allergies).    losartan (COZAAR) 25 MG tablet Take 25 mg by mouth daily.    Polyvinyl Alcohol-Povidone PF 1.4-0.6 % SOLN Place 1 drop into both eyes as needed (for dry eyes).    rosuvastatin (CRESTOR) 5 MG tablet Take 1 tablet (5 mg total) by mouth every Monday, Wednesday, and Friday.     Allergies:   Ace inhibitors; Sulfa antibiotics; and Antihistamines, chlorpheniramine-type   Social History   Socioeconomic History   Marital status: Married    Spouse name: Not on file   Number of children: Not on  file   Years of education: Not on file   Highest education level: Not on file  Occupational History   Not on file  Tobacco Use   Smoking status: Never Smoker   Smokeless tobacco: Never Used  Vaping Use   Vaping Use: Never used  Substance and Sexual Activity   Alcohol use: Yes    Comment: one drink per day (wine or beer)   Drug use: No   Sexual activity: Yes  Other Topics Concern   Not on file  Social History Narrative   Not on file   Social Determinants of Health   Financial Resource Strain: Not on file  Food Insecurity: Not on file  Transportation Needs: Not on file  Physical Activity: Not on file  Stress: Not on file  Social Connections: Not on file     Family History: The patient's family history includes Cancer in his father, maternal aunt, maternal uncle, mother, and paternal  uncle; Heart failure in his father.  ROS:   Please see the history of present illness.    ROS  All other systems reviewed and negative.   EKGs/Labs/Other Studies Reviewed:    The following studies were reviewed today:  2D echo 03/2020 IMPRESSIONS   1. There is an echolucent structure in the liver. Would recommend  dedicated liver ultrasound for better characaterization.  2. Left ventricular ejection fraction, by estimation, is 60 to 65%. The  left ventricle has normal function. The left ventricle has no regional  wall motion abnormalities. Left ventricular diastolic function could not  be evaluated.  3. Right ventricular systolic function is mildly reduced. The right  ventricular size is normal. Tricuspid regurgitation signal is inadequate  for assessing PA pressure.  4. The mitral valve has been repaired/replaced. No evidence of mitral  valve regurgitation. No evidence of mitral stenosis. The mean mitral valve  gradient is 3.1 mmHg with average heart rate of 67 bpm. There is a 43mm  Sorin Memo 4D Ring annuloplasty  present in the mitral position. Procedure Date: 04/27/2018. Echo findings  are consistent with normal structure and function of the mitral valve  prosthesis.  5. The aortic valve is tricuspid. Aortic valve regurgitation is not  visualized. No aortic stenosis is present.   Comparison(s): No significant change from prior study. 01/07/19 EF 60-65%.  MV 25mmHg mean PG.   EKG:  EKG is ordered today and showed NSR with PACs  Recent Labs: No results found for requested labs within last 8760 hours.   Recent Lipid Panel No results found for: CHOL, TRIG, HDL, CHOLHDL, VLDL, LDLCALC, LDLDIRECT  Physical Exam:    VS:  BP 110/82    Pulse 84    Ht 6' (1.829 m)    Wt 200 lb (90.7 kg)    SpO2 97%    BMI 27.12 kg/m     Wt Readings from Last 3 Encounters:  07/20/20 200 lb (90.7 kg)  04/16/20 202 lb 12.8 oz (92 kg)  04/21/19 210 lb (95.3 kg)     GEN: Well nourished,  well developed in no acute distress HEENT: Normal NECK: No JVD; No carotid bruits LYMPHATICS: No lymphadenopathy CARDIAC:RRR, no murmurs, rubs, gallops with occasional ectopy RESPIRATORY:  Clear to auscultation without rales, wheezing or rhonchi  ABDOMEN: Soft, non-tender, non-distended MUSCULOSKELETAL:  No edema; No deformity  SKIN: Warm and dry NEUROLOGIC:  Alert and oriented x 3 PSYCHIATRIC:  Normal affect    ASSESSMENT:    1. MVP (mitral valve prolapse)   2.  Benign essential HTN   3. Paroxysmal atrial fibrillation (HCC)   4. Bradycardia   5. Ascending aortic aneurysm (HCC)   6. Pure hypercholesterolemia    PLAN:    In order of problems listed above:  1.  Severe MVP with flail chordae and severe MR -s/p MV repair after bacterial endocarditis in 2019 -2D echo 03/2020 showed stable MV repair with  mean gradient 89mHg with no MR  2.  HTN -BP is well controlled on exam today -continue Losartan 25mg  daily -check BMET today  3.  PAF -s/p MAZE at time of MV repair -maintaining NSR -denies any bleeding problems on DOAC -continue Eliquis 5mg  BID  -continue low dose carvedilol 3.125mg  BID -check BMET and Hbg  4.  Bradycardia -resolved after decreasing carvedilol  5. Dilated ascending aorta and aortic root -mild at 3.9cm and 4cm respectively -repeat echo 03/2021 -continue statin -BP controlled  6.  HLD -LDL goal < 100 but preferably < 70 given aortic dilatation -LDL was 102 in march 2021 -repeat FLp and ALT -continue statin  Medication Adjustments/Labs and Tests Ordered: Current medicines are reviewed at length with the patient today.  Concerns regarding medicines are outlined above.  No orders of the defined types were placed in this encounter.  No orders of the defined types were placed in this encounter.   Signed, Fransico Him, MD  07/20/2020 11:14 AM    [place]

## 2020-07-20 NOTE — Addendum Note (Signed)
Addended by: Precious Gilding on: 07/20/2020 11:27 AM   Modules accepted: Orders

## 2020-07-20 NOTE — Patient Instructions (Signed)
Medication Instructions:  Your physician recommends that you continue on your current medications as directed. Please refer to the Current Medication list given to you today.  *If you need a refill on your cardiac medications before your next appointment, please call your pharmacy*   Lab Work: TODAY: CMET, fasting lipid panel, CBC If you have labs (blood work) drawn today and your tests are completely normal, you will receive your results only by: Marland Kitchen MyChart Message (if you have MyChart) OR . A paper copy in the mail If you have any lab test that is abnormal or we need to change your treatment, we will call you to review the results.   Testing/Procedures: Your physician has requested that you have an echocardiogram in October 2022. Echocardiography is a painless test that uses sound waves to create images of your heart. It provides your doctor with information about the size and shape of your heart and how well your heart's chambers and valves are working. This procedure takes approximately one hour. There are no restrictions for this procedure.      Follow-Up: At [facility], you and your health needs are our priority.  As part of our continuing mission to provide you with exceptional heart care, we have created designated Provider Care Teams.  These Care Teams include your primary Cardiologist (physician) and Advanced Practice Providers (APPs -  Physician Assistants and Nurse Practitioners) who all work together to provide you with the care you need, when you need it.  Your next appointment:   12 month(s)  The format for your next appointment:   In Person  Provider:   You may see Fransico Him, MD or one of the following Advanced Practice Providers on your designated Care Team:    Melina Copa, PA-C  Ermalinda Barrios, PA-C

## 2020-07-25 NOTE — Addendum Note (Signed)
Addended by: Antonieta Iba on: 07/25/2020 03:08 PM   Modules accepted: Orders

## 2020-07-28 ENCOUNTER — Other Ambulatory Visit: Payer: Self-pay | Admitting: Cardiology

## 2020-07-31 ENCOUNTER — Other Ambulatory Visit: Payer: Self-pay

## 2020-09-27 DIAGNOSIS — D6869 Other thrombophilia: Secondary | ICD-10-CM | POA: Diagnosis not present

## 2020-09-27 DIAGNOSIS — M179 Osteoarthritis of knee, unspecified: Secondary | ICD-10-CM | POA: Diagnosis not present

## 2020-09-27 DIAGNOSIS — Z79899 Other long term (current) drug therapy: Secondary | ICD-10-CM | POA: Diagnosis not present

## 2020-09-27 DIAGNOSIS — Z1389 Encounter for screening for other disorder: Secondary | ICD-10-CM | POA: Diagnosis not present

## 2020-09-27 DIAGNOSIS — Z0001 Encounter for general adult medical examination with abnormal findings: Secondary | ICD-10-CM | POA: Diagnosis not present

## 2020-09-27 DIAGNOSIS — Z113 Encounter for screening for infections with a predominantly sexual mode of transmission: Secondary | ICD-10-CM | POA: Diagnosis not present

## 2020-09-27 DIAGNOSIS — Q6100 Congenital renal cyst, unspecified: Secondary | ICD-10-CM | POA: Diagnosis not present

## 2020-09-27 DIAGNOSIS — E559 Vitamin D deficiency, unspecified: Secondary | ICD-10-CM | POA: Diagnosis not present

## 2020-09-27 DIAGNOSIS — E785 Hyperlipidemia, unspecified: Secondary | ICD-10-CM | POA: Diagnosis not present

## 2020-09-27 DIAGNOSIS — I10 Essential (primary) hypertension: Secondary | ICD-10-CM | POA: Diagnosis not present

## 2020-09-27 DIAGNOSIS — I4891 Unspecified atrial fibrillation: Secondary | ICD-10-CM | POA: Diagnosis not present

## 2020-10-11 DIAGNOSIS — Z85828 Personal history of other malignant neoplasm of skin: Secondary | ICD-10-CM | POA: Diagnosis not present

## 2020-10-11 DIAGNOSIS — L738 Other specified follicular disorders: Secondary | ICD-10-CM | POA: Diagnosis not present

## 2020-10-22 DIAGNOSIS — M179 Osteoarthritis of knee, unspecified: Secondary | ICD-10-CM | POA: Diagnosis not present

## 2020-10-22 DIAGNOSIS — I4891 Unspecified atrial fibrillation: Secondary | ICD-10-CM | POA: Diagnosis not present

## 2020-10-22 DIAGNOSIS — E785 Hyperlipidemia, unspecified: Secondary | ICD-10-CM | POA: Diagnosis not present

## 2020-10-22 DIAGNOSIS — I10 Essential (primary) hypertension: Secondary | ICD-10-CM | POA: Diagnosis not present

## 2020-11-22 DIAGNOSIS — R1030 Lower abdominal pain, unspecified: Secondary | ICD-10-CM | POA: Diagnosis not present

## 2021-01-04 DIAGNOSIS — I4891 Unspecified atrial fibrillation: Secondary | ICD-10-CM | POA: Diagnosis not present

## 2021-01-04 DIAGNOSIS — I10 Essential (primary) hypertension: Secondary | ICD-10-CM | POA: Diagnosis not present

## 2021-01-04 DIAGNOSIS — M179 Osteoarthritis of knee, unspecified: Secondary | ICD-10-CM | POA: Diagnosis not present

## 2021-01-04 DIAGNOSIS — E785 Hyperlipidemia, unspecified: Secondary | ICD-10-CM | POA: Diagnosis not present

## 2021-03-15 ENCOUNTER — Telehealth: Payer: Self-pay | Admitting: Cardiology

## 2021-03-15 NOTE — Telephone Encounter (Signed)
   [facility] Pre-operative Risk Assessment    Patient Name: Chase Smith.  DOB: August 04, 1945 MRN: [id number]  HEARTCARE STAFF:  - IMPORTANT!!!!!! Under Visit Info/Reason for Call, type in Other and utilize the format Clearance MM/DD/YY or Clearance TBD. Do not use dashes or single digits. - Please review there is not already an duplicate clearance open for this procedure. - If request is for dental extraction, please clarify the # of teeth to be extracted. - If the patient is currently at the dentist's office, call Pre-Op Callback Staff (MA/nurse) to input urgent request.  - If the patient is not currently in the dentist office, please route to the Pre-Op pool.  Request for surgical clearance:  What type of surgery is being performed? Colonoscopy   When is this surgery scheduled? 03/21/21  What type of clearance is required (medical clearance vs. Pharmacy clearance to hold med vs. Both)? Both  Are there any medications that need to be held prior to surgery and how long? Eliquis one day prior   Practice name and name of physician performing surgery? [facility] Therisa Doyne   What is the office phone number? [phone]   7.   What is the office fax number? [phone]  8.   Anesthesia type (None, local, MAC, general) ? MAC   Chase J Martinique 03/15/2021, 1:26 PM  _________________________________________________________________   (provider comments below)

## 2021-03-18 NOTE — Telephone Encounter (Signed)
Patient with diagnosis of afib on Eliquis for anticoagulation.    Procedure: colonoscopy Date of procedure: 03/21/21  CHA2DS2-VASc Score = 2   This indicates a 2.2% annual risk of stroke. The patient's score is based upon: CHF History: 0 HTN History: 1 Diabetes History: 0 Stroke History: 0 Vascular Disease History: 0 Age Score: 1 Gender Score: 0     CrCl 58 ml/min  Per office protocol, patient can hold Eliquis for 2 days prior to procedure.

## 2021-03-18 NOTE — Telephone Encounter (Signed)
   Primary Cardiologist: Fransico Him, MD  Chart reviewed as part of pre-operative protocol coverage. Given past medical history and time since last visit, based on ACC/AHA guidelines, Floren Muraski. would be at acceptable risk for the planned procedure without further cardiovascular testing.   Patient with diagnosis of afib on Eliquis for anticoagulation.     Procedure: colonoscopy Date of procedure: 03/21/21   CHA2DS2-VASc Score = 2   This indicates a 2.2% annual risk of stroke. The patient's score is based upon: CHF History: 0 HTN History: 1 Diabetes History: 0 Stroke History: 0 Vascular Disease History: 0 Age Score: 1 Gender Score: 0     CrCl 58 ml/min   Per office protocol, patient can hold Eliquis for 2 days prior to procedure.  Patient was advised that if he develops new symptoms prior to surgery to contact our office to arrange a follow-up appointment.  He verbalized understanding.  I will route this recommendation to the requesting party via Epic fax function and remove from pre-op pool.  Please call with questions.  Jossie Ng. Stamatia Masri NP-C    03/18/2021, [facility] [address] Office [phone] Fax [phone]

## 2021-03-21 DIAGNOSIS — K5730 Diverticulosis of large intestine without perforation or abscess without bleeding: Secondary | ICD-10-CM | POA: Diagnosis not present

## 2021-03-21 DIAGNOSIS — Z86010 Personal history of colonic polyps: Secondary | ICD-10-CM | POA: Diagnosis not present

## 2021-04-18 ENCOUNTER — Ambulatory Visit (HOSPITAL_COMMUNITY): Payer: Medicare PPO

## 2021-05-14 ENCOUNTER — Ambulatory Visit (HOSPITAL_COMMUNITY): Payer: Medicare PPO | Attending: Cardiovascular Disease

## 2021-05-14 ENCOUNTER — Other Ambulatory Visit: Payer: Self-pay

## 2021-05-14 DIAGNOSIS — I7121 Aneurysm of the ascending aorta, without rupture: Secondary | ICD-10-CM | POA: Diagnosis not present

## 2021-05-14 DIAGNOSIS — I341 Nonrheumatic mitral (valve) prolapse: Secondary | ICD-10-CM | POA: Diagnosis not present

## 2021-05-14 DIAGNOSIS — I080 Rheumatic disorders of both mitral and aortic valves: Secondary | ICD-10-CM

## 2021-05-14 LAB — ECHOCARDIOGRAM COMPLETE
Area-P 1/2: 2.37 cm2
MV VTI: 1.65 cm2
S' Lateral: 3 cm

## 2021-05-15 ENCOUNTER — Encounter: Payer: Self-pay | Admitting: Cardiology

## 2021-06-06 DIAGNOSIS — J209 Acute bronchitis, unspecified: Secondary | ICD-10-CM | POA: Diagnosis not present

## 2021-06-06 DIAGNOSIS — R509 Fever, unspecified: Secondary | ICD-10-CM | POA: Diagnosis not present

## 2021-06-12 DIAGNOSIS — D485 Neoplasm of uncertain behavior of skin: Secondary | ICD-10-CM | POA: Diagnosis not present

## 2021-06-12 DIAGNOSIS — Z85828 Personal history of other malignant neoplasm of skin: Secondary | ICD-10-CM | POA: Diagnosis not present

## 2021-06-12 DIAGNOSIS — D0471 Carcinoma in situ of skin of right lower limb, including hip: Secondary | ICD-10-CM | POA: Diagnosis not present

## 2021-06-12 DIAGNOSIS — L821 Other seborrheic keratosis: Secondary | ICD-10-CM | POA: Diagnosis not present

## 2021-06-12 DIAGNOSIS — L570 Actinic keratosis: Secondary | ICD-10-CM | POA: Diagnosis not present

## 2021-06-19 DIAGNOSIS — H524 Presbyopia: Secondary | ICD-10-CM | POA: Diagnosis not present

## 2021-06-19 DIAGNOSIS — H5203 Hypermetropia, bilateral: Secondary | ICD-10-CM | POA: Diagnosis not present

## 2021-06-19 DIAGNOSIS — H2513 Age-related nuclear cataract, bilateral: Secondary | ICD-10-CM | POA: Diagnosis not present

## 2021-06-19 DIAGNOSIS — H1789 Other corneal scars and opacities: Secondary | ICD-10-CM | POA: Diagnosis not present

## 2021-07-15 ENCOUNTER — Other Ambulatory Visit: Payer: Self-pay | Admitting: Cardiology

## 2021-07-20 ENCOUNTER — Other Ambulatory Visit: Payer: Self-pay | Admitting: Cardiology

## 2021-07-26 ENCOUNTER — Other Ambulatory Visit: Payer: Self-pay | Admitting: Cardiology

## 2021-07-29 ENCOUNTER — Other Ambulatory Visit: Payer: Self-pay | Admitting: *Deleted

## 2021-07-29 MED ORDER — ROSUVASTATIN CALCIUM 5 MG PO TABS: 5.0000 mg | ORAL_TABLET | ORAL | 0 refills | Status: DC

## 2021-08-14 DIAGNOSIS — Z202 Contact with and (suspected) exposure to infections with a predominantly sexual mode of transmission: Secondary | ICD-10-CM | POA: Diagnosis not present

## 2021-08-14 DIAGNOSIS — R350 Frequency of micturition: Secondary | ICD-10-CM | POA: Diagnosis not present

## 2021-08-14 DIAGNOSIS — R300 Dysuria: Secondary | ICD-10-CM | POA: Diagnosis not present

## 2021-08-14 DIAGNOSIS — Z113 Encounter for screening for infections with a predominantly sexual mode of transmission: Secondary | ICD-10-CM | POA: Diagnosis not present

## 2021-11-27 DIAGNOSIS — D6869 Other thrombophilia: Secondary | ICD-10-CM | POA: Diagnosis not present

## 2021-11-27 DIAGNOSIS — I4891 Unspecified atrial fibrillation: Secondary | ICD-10-CM | POA: Diagnosis not present

## 2021-11-27 DIAGNOSIS — E785 Hyperlipidemia, unspecified: Secondary | ICD-10-CM | POA: Diagnosis not present

## 2021-11-27 DIAGNOSIS — Z79899 Other long term (current) drug therapy: Secondary | ICD-10-CM | POA: Diagnosis not present

## 2021-11-27 DIAGNOSIS — Z125 Encounter for screening for malignant neoplasm of prostate: Secondary | ICD-10-CM | POA: Diagnosis not present

## 2021-11-27 DIAGNOSIS — I10 Essential (primary) hypertension: Secondary | ICD-10-CM | POA: Diagnosis not present

## 2021-11-27 DIAGNOSIS — E559 Vitamin D deficiency, unspecified: Secondary | ICD-10-CM | POA: Diagnosis not present

## 2021-11-27 DIAGNOSIS — Z0001 Encounter for general adult medical examination with abnormal findings: Secondary | ICD-10-CM | POA: Diagnosis not present

## 2021-11-27 DIAGNOSIS — M179 Osteoarthritis of knee, unspecified: Secondary | ICD-10-CM | POA: Diagnosis not present

## 2021-11-27 DIAGNOSIS — Z1211 Encounter for screening for malignant neoplasm of colon: Secondary | ICD-10-CM | POA: Diagnosis not present

## 2021-12-05 ENCOUNTER — Other Ambulatory Visit: Payer: Self-pay | Admitting: Cardiology

## 2021-12-17 DIAGNOSIS — Z85828 Personal history of other malignant neoplasm of skin: Secondary | ICD-10-CM | POA: Diagnosis not present

## 2021-12-17 DIAGNOSIS — D0462 Carcinoma in situ of skin of left upper limb, including shoulder: Secondary | ICD-10-CM | POA: Diagnosis not present

## 2021-12-17 DIAGNOSIS — D225 Melanocytic nevi of trunk: Secondary | ICD-10-CM | POA: Diagnosis not present

## 2021-12-17 DIAGNOSIS — L570 Actinic keratosis: Secondary | ICD-10-CM | POA: Diagnosis not present

## 2021-12-17 DIAGNOSIS — L821 Other seborrheic keratosis: Secondary | ICD-10-CM | POA: Diagnosis not present

## 2021-12-17 DIAGNOSIS — D485 Neoplasm of uncertain behavior of skin: Secondary | ICD-10-CM | POA: Diagnosis not present

## 2021-12-17 DIAGNOSIS — C44629 Squamous cell carcinoma of skin of left upper limb, including shoulder: Secondary | ICD-10-CM | POA: Diagnosis not present

## 2021-12-17 DIAGNOSIS — L718 Other rosacea: Secondary | ICD-10-CM | POA: Diagnosis not present

## 2022-01-09 DIAGNOSIS — D485 Neoplasm of uncertain behavior of skin: Secondary | ICD-10-CM | POA: Diagnosis not present

## 2022-01-09 DIAGNOSIS — Z85828 Personal history of other malignant neoplasm of skin: Secondary | ICD-10-CM | POA: Diagnosis not present

## 2022-01-09 DIAGNOSIS — C44622 Squamous cell carcinoma of skin of right upper limb, including shoulder: Secondary | ICD-10-CM | POA: Diagnosis not present

## 2022-01-17 ENCOUNTER — Other Ambulatory Visit: Payer: Self-pay | Admitting: Cardiology

## 2022-01-22 ENCOUNTER — Other Ambulatory Visit: Payer: Self-pay | Admitting: Cardiology

## 2022-01-30 ENCOUNTER — Other Ambulatory Visit: Payer: Self-pay | Admitting: Cardiology

## 2022-03-07 ENCOUNTER — Telehealth: Payer: Self-pay | Admitting: Cardiology

## 2022-03-07 DIAGNOSIS — I341 Nonrheumatic mitral (valve) prolapse: Secondary | ICD-10-CM

## 2022-03-07 NOTE — Telephone Encounter (Signed)
Patient wants to know if he needs to have an echo done prior to his visit on 07/11/22. Please advise.

## 2022-03-07 NOTE — Telephone Encounter (Signed)
Spoke with the patient and scheduled for an echocardiogram in December

## 2022-03-07 NOTE — Telephone Encounter (Signed)
Sueanne Margarita, MD  05/15/2021 12:25 PM EST     2D echo showed normal LV function EF 56% with mildly dilated left and right atria, stable mitral valve annuloplasty ring with no mitral regurgitation, mildly leaky aortic valve, mildly dilated aortic root at 41 mm and ascending aorta at 40 mm -repeat limited 2D echo in 1 year for follow-up of dilated aorta   Order has been placed.

## 2022-06-03 ENCOUNTER — Ambulatory Visit (HOSPITAL_COMMUNITY): Payer: Medicare PPO | Attending: Cardiology

## 2022-06-03 DIAGNOSIS — I341 Nonrheumatic mitral (valve) prolapse: Secondary | ICD-10-CM | POA: Insufficient documentation

## 2022-06-03 LAB — ECHOCARDIOGRAM COMPLETE
Area-P 1/2: 3.29 cm2
MV VTI: 1.29 cm2
S' Lateral: 3 cm

## 2022-06-26 DIAGNOSIS — H524 Presbyopia: Secondary | ICD-10-CM | POA: Diagnosis not present

## 2022-06-26 DIAGNOSIS — H2513 Age-related nuclear cataract, bilateral: Secondary | ICD-10-CM | POA: Diagnosis not present

## 2022-06-26 DIAGNOSIS — H1789 Other corneal scars and opacities: Secondary | ICD-10-CM | POA: Diagnosis not present

## 2022-06-26 DIAGNOSIS — H353132 Nonexudative age-related macular degeneration, bilateral, intermediate dry stage: Secondary | ICD-10-CM | POA: Diagnosis not present

## 2022-07-11 ENCOUNTER — Ambulatory Visit: Payer: Medicare PPO | Attending: Cardiology | Admitting: Cardiology

## 2022-07-11 ENCOUNTER — Encounter: Payer: Self-pay | Admitting: Cardiology

## 2022-07-11 VITALS — BP 122/60 | HR 84 | Ht 72.0 in | Wt 202.8 lb

## 2022-07-11 DIAGNOSIS — R001 Bradycardia, unspecified: Secondary | ICD-10-CM

## 2022-07-11 DIAGNOSIS — I10 Essential (primary) hypertension: Secondary | ICD-10-CM

## 2022-07-11 DIAGNOSIS — E7800 Pure hypercholesterolemia, unspecified: Secondary | ICD-10-CM

## 2022-07-11 DIAGNOSIS — I480 Paroxysmal atrial fibrillation: Secondary | ICD-10-CM | POA: Diagnosis not present

## 2022-07-11 DIAGNOSIS — I7121 Aneurysm of the ascending aorta, without rupture: Secondary | ICD-10-CM | POA: Diagnosis not present

## 2022-07-11 DIAGNOSIS — I341 Nonrheumatic mitral (valve) prolapse: Secondary | ICD-10-CM | POA: Diagnosis not present

## 2022-07-11 NOTE — Patient Instructions (Signed)
Medication Instructions:  Your physician recommends that you continue on your current medications as directed. Please refer to the Current Medication list given to you today.  *If you need a refill on your cardiac medications before your next appointment, please call your pharmacy*   Lab Work: None.  If you have labs (blood work) drawn today and your tests are completely normal, you will receive your results only by: MyChart Message (if you have MyChart) OR A paper copy in the mail If you have any lab test that is abnormal or we need to change your treatment, we will call you to review the results.   Testing/Procedures: None.   Follow-Up:   Your next appointment:   1 year(s)  Provider:   Traci Turner, MD     

## 2022-07-11 NOTE — Addendum Note (Signed)
Addended by: Joni Reining on: 07/11/2022 08:48 AM   Modules accepted: Orders

## 2022-07-11 NOTE — Progress Notes (Signed)
Cardiology Office Note:    Date:  07/11/2022   ID:  Chase Smith., DOB July 30, 1945, MRN [id number]  PCP:  Josetta Huddle, MD  Cardiologist:  Fransico Him, MD    Referring MD: Josetta Huddle, MD   Chief Complaint  Patient presents with   New Patient (Initial Visit)    Mitral valve disease, HTN, PAF, dilated ascending aorta, HLD    History of Present Illness:    Chase Smith. is a 77 y.o. male with a hx of bileaflet mitral valve prolapse with mild MR, dilated aortic root, grade 2 diastolic dysfunction and PAF.  He is on Eliquis for a CHADS2VASC score of 4.   He was admitted with strep viridans bacteremia in July 2019.  2D echo 12/2017 showed normal LVF with EF 60-65% with severe holosystolic MVP of the posterior MV leaflet with severe MR that was eccentric.  Due to bacteremia patient underwent TEE which confirmed severe mitral prolapse of the PMLV with severe MR.  There was also an oscillating density that was felt to possibly be due to a ruptured cord but could not rule out small vegetation and patient was treated with a prolonged course of antibx for endocarditis.    Followup 2D echo 02/2018 showed normal LVF with flail segment of posterior MVL with moderate to severe anteriorly directed MR.  He was referred to structural heart team and underwent right and left heart cath showing normal coronary arteries and severe MR and underwent minimally invasive MV repair and MAZE procedure with LA appendage clipping 04/27/2018.  He has been maintaining NSR since then and is on Eliquis '5mg'$  BID for CHADS2VASC score of 2.  He is here today for followup and is doing well.  He denies any chest pain or pressure, SOB, DOE, PND, orthopnea, LE edema, dizziness, palpitations or syncope. He is compliant with his meds and is tolerating meds with no SE.     Past Medical History:  Diagnosis Date   Abdominal mass, RLQ (right lower quadrant) 07/27/2012   Acquired dilation of ascending aorta and aortic root  (HCC)    61m ascending aorta and 458maortic root by echo 04/2021   Benign essential HTN 02/07/2015   BPH (benign prostatic hyperplasia)    mild   ED (erectile dysfunction) 08/2011   cancelled f/u with Urology   Epididymitis    recurrent   Finger laceration    left index finger   Hyperlipidemia    Hypertension    in the past - resolved   Left ventricular diastolic dysfunction, NYHA class 2    Meckel's diverticulitis 08/27/2012   MVP (mitral valve prolapse)    Bileaflet MVP with partially flail segment of the posterior leaflt with 4+ MR in setting of strep viridans bacteremia s/p minimally invasive MV repari   PAF (paroxysmal atrial fibrillation) ([facility]   CHADS2VASC score is 2 (HTN and age > 6533on Apixaban   S/P minimally invasive maze operation for atrial fibrillation 04/27/2018   Complete bilateral atrial lesion set using cryothermy and bipolar radiofrequency ablation with clipping of LA appendage via right mini thoracotomy approach   S/P minimally invasive mitral valve repair 04/27/2018   Complex valvuloplasty including triangular resection of P2 segment of posterior leaflet, artificial Gore-tex neochord placement x8 and Sorin Memo 4D ring annulopasty via right mini thoracotomy approach   Vertigo    episodic mild positional    Past Surgical History:  Procedure Laterality Date   APPENDECTOMY  CARDIAC CATHETERIZATION  03/24/2018   CLIPPING OF ATRIAL APPENDAGE  04/27/2018   Procedure: CLIPPING OF ATRIAL APPENDAGE;  Surgeon: Rexene Alberts, MD;  Location: [place];  Service: Open Heart Surgery;;   LAPAROSCOPIC APPENDECTOMY N/A 08/11/2012   Procedure: APPENDECTOMY LAPAROSCOPIC;  Surgeon: Stark Klein, MD;  Location: [facility];  Service: General;  Laterality: N/A;   LAPAROSCOPIC SMALL BOWEL RESECTION N/A 08/11/2012   Procedure: Diagnostic Laparoscopy,  Diverticulectomy and laparoscopic appendectomy;  Surgeon: Stark Klein, MD;  Location: [facility];  Service: General;   Laterality: N/A;  Diagnostic Laparoscopy, Small Bowel Resection vs Diverticulectomy    MINIMALLY INVASIVE MAZE PROCEDURE N/A 04/27/2018   Procedure: MINIMALLY INVASIVE MAZE PROCEDURE;  Surgeon: Rexene Alberts, MD;  Location: [place];  Service: Open Heart Surgery;  Laterality: N/A;   MITRAL VALVE REPAIR Right 04/27/2018   Procedure: MINIMALLY INVASIVE MITRAL VALVE REPAIR (MVR);  Surgeon: Rexene Alberts, MD;  Location: [place];  Service: Open Heart Surgery;  Laterality: Right;   NERVE, TENDON AND ARTERY REPAIR Left 10/28/2018   Procedure: TENDON, ARTERY, NERVE REPAIR LEFT INDEX FINGER, DEBRIDEMENT OF WOUND;  Surgeon: Daryll Brod, MD;  Location: [place];  Service: Orthopedics;  Laterality: Left;   PILONIDAL CYST EXCISION     RECONSTRUCTION OF NOSE     secondary to deviated septum   RIGHT/LEFT HEART CATH AND CORONARY ANGIOGRAPHY N/A 03/24/2018   Procedure: RIGHT/LEFT HEART CATH AND CORONARY ANGIOGRAPHY;  Surgeon: Jettie Booze, MD;  Location: [place] [facility];  Service: Cardiovascular;  Laterality: N/A;   TEE WITHOUT CARDIOVERSION N/A 08/08/2013   Procedure: TRANSESOPHAGEAL ECHOCARDIOGRAM (TEE);  Surgeon: Sueanne Margarita, MD;  [facility];  Service: Cardiovascular;  Laterality: N/A;   TEE WITHOUT CARDIOVERSION N/A 01/05/2018   Procedure: TRANSESOPHAGEAL ECHOCARDIOGRAM (TEE);  Surgeon: Lelon Perla, MD;  Location: [facility];  Service: Cardiovascular;  Laterality: N/A;   TEE WITHOUT CARDIOVERSION N/A 04/27/2018   Procedure: TRANSESOPHAGEAL ECHOCARDIOGRAM (TEE);  Surgeon: Rexene Alberts, MD;  Location: [place];  Service: Open Heart Surgery;  Laterality: N/A;   TONSILLECTOMY      Current Medications: Current Meds  Medication Sig   acetaminophen (TYLENOL) 500 MG tablet Take 500-1,000 mg by mouth as needed for moderate pain or headache.    amoxicillin (AMOXIL) 500 MG capsule TAKE FOUR CAPSULES BY MOUTH 30-60 minutes prior TO dental procedures   apixaban  (ELIQUIS) 5 MG TABS tablet Take 1 tablet (5 mg total) by mouth 2 (two) times daily.   carvedilol (COREG) 3.125 MG tablet TAKE 1 TABLET (3.125 MG TOTAL) BY MOUTH 2 (TWO) TIMES DAILY WITH A MEAL.   Cholecalciferol (VITAMIN D) 2000 UNITS tablet Take 2,000 Units daily by mouth.    Coenzyme Q10 (CO Q-10 PO) Take 1 capsule by mouth daily.   Cyanocobalamin (VITAMIN B12) 500 MCG TABS Take 500 mcg by mouth daily. Take for 5 days only.   flunisolide (NASALIDE) 25 MCG/ACT (0.025%) SOLN Place 2 sprays into the nose daily as needed (for allergies).    losartan (COZAAR) 25 MG tablet Take 25 mg by mouth daily.    Polyvinyl Alcohol-Povidone PF 1.4-0.6 % SOLN Place 1 drop into both eyes as needed (for dry eyes).    rosuvastatin (CRESTOR) 5 MG tablet TAKE 1 TABLET (5 MG TOTAL) BY MOUTH EVERY MONDAY, WEDNESDAY, AND FRIDAY.     Allergies:   Ace inhibitors; Sulfa antibiotics; and Antihistamines, chlorpheniramine-type   Social History   Socioeconomic History   Marital status: Married  Spouse name: Not on file   Number of children: Not on file   Years of education: Not on file   Highest education level: Not on file  Occupational History   Not on file  Tobacco Use   Smoking status: Never   Smokeless tobacco: Never  Vaping Use   Vaping Use: Never used  Substance and Sexual Activity   Alcohol use: Yes    Comment: one drink per day (wine or beer)   Drug use: No   Sexual activity: Yes  Other Topics Concern   Not on file  Social History Narrative   Not on file   Social Determinants of Health   Financial Resource Strain: Not on file  Food Insecurity: Not on file  Transportation Needs: Not on file  Physical Activity: Not on file  Stress: Not on file  Social Connections: Not on file     Family History: The patient's family history includes Cancer in his father, maternal aunt, maternal uncle, mother, and paternal uncle; Heart failure in his father.  ROS:   Please see the history of present  illness.    ROS  All other systems reviewed and negative.   EKGs/Labs/Other Studies Reviewed:    The following studies were reviewed today:  2D echo 03/2020 IMPRESSIONS    1. There is an echolucent structure in the liver. Would recommend  dedicated liver ultrasound for better characaterization.   2. Left ventricular ejection fraction, by estimation, is 60 to 65%. The  left ventricle has normal function. The left ventricle has no regional  wall motion abnormalities. Left ventricular diastolic function could not  be evaluated.   3. Right ventricular systolic function is mildly reduced. The right  ventricular size is normal. Tricuspid regurgitation signal is inadequate  for assessing PA pressure.   4. The mitral valve has been repaired/replaced. No evidence of mitral  valve regurgitation. No evidence of mitral stenosis. The mean mitral valve  gradient is 3.1 mmHg with average heart rate of 67 bpm. There is a 31m  Sorin Memo 4D Ring annuloplasty  present in the mitral position. Procedure Date: 04/27/2018. Echo findings  are consistent with normal structure and function of the mitral valve  prosthesis.   5. The aortic valve is tricuspid. Aortic valve regurgitation is not  visualized. No aortic stenosis is present.   Comparison(s): No significant change from prior study. 01/07/19 EF 60-65%.  MV 360mg mean PG.   EKG:  EKG is ordered today and showed NSR with sinus arrhythmia  Recent Labs: No results found for requested labs within last 365 days.   Recent Lipid Panel    Component Value Date/Time   CHOL 154 07/20/2020 1133   TRIG 113 07/20/2020 1133   HDL 48 07/20/2020 1133   CHOLHDL 3.2 07/20/2020 1133   LDLCALC 86 07/20/2020 1133    Physical Exam:    VS:  BP 122/60 (BP Location: Right Arm, Patient Position: Sitting)   Pulse 84   Ht 6' (1.829 m)   Wt 202 lb 12.8 oz (92 kg)   BMI 27.50 kg/m     Wt Readings from Last 3 Encounters:  07/11/22 202 lb 12.8 oz (92 kg)   07/20/20 200 lb (90.7 kg)  04/16/20 202 lb 12.8 oz (92 kg)    GEN: Well nourished, well developed in no acute distress HEENT: Normal NECK: No JVD; No carotid bruits LYMPHATICS: No lymphadenopathy CARDIAC:RRR, no murmurs, rubs, gallops RESPIRATORY:  Clear to auscultation without rales, wheezing or rhonchi  ABDOMEN: Soft, non-tender, non-distended MUSCULOSKELETAL:  No edema; No deformity  SKIN: Warm and dry NEUROLOGIC:  Alert and oriented x 3 PSYCHIATRIC:  Normal affect   ASSESSMENT:    1. MVP (mitral valve prolapse)   2. Benign essential HTN   3. Paroxysmal atrial fibrillation (HCC)   4. Bradycardia   5. Aneurysm of ascending aorta without rupture (HCC)   6. Pure hypercholesterolemia    PLAN:    In order of problems listed above:  1.  Severe MVP with flail chordae and severe MR -s/p MV repair after bacterial endocarditis in 2019 -2D echo 03/2020 showed stable MV repair with  mean gradient 15mg with no MR -2D echo 05/2022 showed stable MV repair with mean MVG 436mg and no MR  2.  HTN -BP controlled on exam today -continue prescription drug management with Losartan '25mg'$  daily with PRN refills -I have personally reviewed and interpreted outside labs performed by patient's PCP which showed SCr 0.93 and K+ 4.4 in May 2023  3.  PAF -s/p MAZE at time of MV repair -Continues to maintain normal sinus rhythm and denies any palpitations -denies any bleeding problems on DOAC -Continue drug management carvedilol 3.125 mg twice daily and Eliquis 5 mg twice daily with PRN Refills -I have personally reviewed and interpreted outside labs performed by patient's PCP which showed SCr 0.93 and Hbg 15.7 in May 2023  4.  Bradycardia -resolved after decreasing carvedilol  5. Dilated ascending aorta and aortic root -2D echo 05/2022 4 cm and 3.9 cm respectively -continue statin -BP controlled  6.  HLD -LDL goal < 100 but preferably < 100 -I have personally reviewed and interpreted  outside labs performed by patient's PCP which showed LDL 94 and HDL 57 in May 2023 -Continue prescription drug management with rosuvastatin 5 mg every Monday Wednesday and Friday with as needed refills  Followup with me in 1 year  Medication Adjustments/Labs and Tests Ordered: Current medicines are reviewed at length with the patient today.  Concerns regarding medicines are outlined above.  No orders of the defined types were placed in this encounter.  No orders of the defined types were placed in this encounter.   Signed, TrFransico HimMD  07/11/2022 8:32 AM    [facility]

## 2022-07-13 ENCOUNTER — Other Ambulatory Visit: Payer: Self-pay | Admitting: Cardiology

## 2022-07-30 ENCOUNTER — Other Ambulatory Visit: Payer: Self-pay | Admitting: Cardiology

## 2022-07-30 DIAGNOSIS — I480 Paroxysmal atrial fibrillation: Secondary | ICD-10-CM

## 2022-07-30 NOTE — Telephone Encounter (Signed)
Prescription refill request for Eliquis received. Indication: Afib  Last office visit: 07/11/22 Radford Pax)  Scr: 0.93 (11/27/21 via [facility])  Age: 77 Weight: 92kg  Appropriate dose. Refill sent.

## 2022-09-22 DIAGNOSIS — Z85828 Personal history of other malignant neoplasm of skin: Secondary | ICD-10-CM | POA: Diagnosis not present

## 2022-09-22 DIAGNOSIS — L570 Actinic keratosis: Secondary | ICD-10-CM | POA: Diagnosis not present

## 2022-09-22 DIAGNOSIS — D485 Neoplasm of uncertain behavior of skin: Secondary | ICD-10-CM | POA: Diagnosis not present

## 2022-10-27 DIAGNOSIS — S70361A Insect bite (nonvenomous), right thigh, initial encounter: Secondary | ICD-10-CM | POA: Diagnosis not present

## 2022-10-27 DIAGNOSIS — W57XXXA Bitten or stung by nonvenomous insect and other nonvenomous arthropods, initial encounter: Secondary | ICD-10-CM | POA: Diagnosis not present

## 2022-10-27 DIAGNOSIS — Z5189 Encounter for other specified aftercare: Secondary | ICD-10-CM | POA: Diagnosis not present

## 2022-10-28 DIAGNOSIS — W57XXXA Bitten or stung by nonvenomous insect and other nonvenomous arthropods, initial encounter: Secondary | ICD-10-CM | POA: Diagnosis not present

## 2022-10-28 DIAGNOSIS — S70361A Insect bite (nonvenomous), right thigh, initial encounter: Secondary | ICD-10-CM | POA: Diagnosis not present

## 2022-12-18 DIAGNOSIS — Z0000 Encounter for general adult medical examination without abnormal findings: Secondary | ICD-10-CM | POA: Diagnosis not present

## 2022-12-18 DIAGNOSIS — Z1331 Encounter for screening for depression: Secondary | ICD-10-CM | POA: Diagnosis not present

## 2022-12-18 DIAGNOSIS — C44622 Squamous cell carcinoma of skin of right upper limb, including shoulder: Secondary | ICD-10-CM | POA: Diagnosis not present

## 2022-12-18 DIAGNOSIS — Z85828 Personal history of other malignant neoplasm of skin: Secondary | ICD-10-CM | POA: Diagnosis not present

## 2022-12-18 DIAGNOSIS — Z79899 Other long term (current) drug therapy: Secondary | ICD-10-CM | POA: Diagnosis not present

## 2022-12-18 DIAGNOSIS — M179 Osteoarthritis of knee, unspecified: Secondary | ICD-10-CM | POA: Diagnosis not present

## 2022-12-18 DIAGNOSIS — I10 Essential (primary) hypertension: Secondary | ICD-10-CM | POA: Diagnosis not present

## 2022-12-18 DIAGNOSIS — L821 Other seborrheic keratosis: Secondary | ICD-10-CM | POA: Diagnosis not present

## 2022-12-18 DIAGNOSIS — D1801 Hemangioma of skin and subcutaneous tissue: Secondary | ICD-10-CM | POA: Diagnosis not present

## 2022-12-18 DIAGNOSIS — L570 Actinic keratosis: Secondary | ICD-10-CM | POA: Diagnosis not present

## 2022-12-18 DIAGNOSIS — E785 Hyperlipidemia, unspecified: Secondary | ICD-10-CM | POA: Diagnosis not present

## 2022-12-18 DIAGNOSIS — L820 Inflamed seborrheic keratosis: Secondary | ICD-10-CM | POA: Diagnosis not present

## 2022-12-18 DIAGNOSIS — E559 Vitamin D deficiency, unspecified: Secondary | ICD-10-CM | POA: Diagnosis not present

## 2022-12-18 DIAGNOSIS — Q6100 Congenital renal cyst, unspecified: Secondary | ICD-10-CM | POA: Diagnosis not present

## 2022-12-18 DIAGNOSIS — I4891 Unspecified atrial fibrillation: Secondary | ICD-10-CM | POA: Diagnosis not present

## 2022-12-18 DIAGNOSIS — D6869 Other thrombophilia: Secondary | ICD-10-CM | POA: Diagnosis not present

## 2022-12-18 DIAGNOSIS — I77810 Thoracic aortic ectasia: Secondary | ICD-10-CM | POA: Diagnosis not present

## 2022-12-18 DIAGNOSIS — D485 Neoplasm of uncertain behavior of skin: Secondary | ICD-10-CM | POA: Diagnosis not present

## 2023-06-03 DIAGNOSIS — J392 Other diseases of pharynx: Secondary | ICD-10-CM | POA: Diagnosis not present

## 2023-06-03 DIAGNOSIS — K219 Gastro-esophageal reflux disease without esophagitis: Secondary | ICD-10-CM | POA: Diagnosis not present

## 2023-06-05 ENCOUNTER — Telehealth: Payer: Self-pay | Admitting: Cardiology

## 2023-06-05 DIAGNOSIS — I7121 Aneurysm of the ascending aorta, without rupture: Secondary | ICD-10-CM

## 2023-06-05 NOTE — Telephone Encounter (Signed)
Called pt advised per MD note repeat echo in 1 yr from last echo in 05/2022.  Order placed and sent to primary RN to ensure testing is scheduled.

## 2023-06-05 NOTE — Telephone Encounter (Signed)
Patient calling to see if he needs to get echo done. He states he has one done every year. Please advise

## 2023-06-25 ENCOUNTER — Other Ambulatory Visit: Payer: Self-pay | Admitting: Cardiology

## 2023-06-25 DIAGNOSIS — I480 Paroxysmal atrial fibrillation: Secondary | ICD-10-CM

## 2023-06-25 NOTE — Telephone Encounter (Signed)
Prescription refill request for Eliquis received. Indication: Afib  Last office visit: 07/11/22 Mayford Knife)  Scr: 0.93 (11/09/21 via [facility])  Age: 77 Weight: 92kg  Labs overdue. Called PCP to request updated labs. Left message on voicemail.

## 2023-06-25 NOTE — Telephone Encounter (Signed)
Labs received from PCP. Scr 0.93 on 12/18/22. Appropriate dose. Refill sent.

## 2023-07-09 ENCOUNTER — Ambulatory Visit (HOSPITAL_COMMUNITY): Payer: Medicare PPO | Attending: Cardiology

## 2023-07-09 ENCOUNTER — Encounter (HOSPITAL_COMMUNITY): Payer: Self-pay

## 2023-07-09 DIAGNOSIS — I7121 Aneurysm of the ascending aorta, without rupture: Secondary | ICD-10-CM

## 2023-07-09 LAB — ECHOCARDIOGRAM COMPLETE
Area-P 1/2: 2.39 cm2
MV VTI: 1.36 cm2
S' Lateral: 3.1 cm

## 2023-07-15 ENCOUNTER — Telehealth: Payer: Self-pay

## 2023-07-15 DIAGNOSIS — K7689 Other specified diseases of liver: Secondary | ICD-10-CM

## 2023-07-15 NOTE — Telephone Encounter (Signed)
 Call to patient to advise echo showed normal heart function with stable MV repair. Advised taht per Dr. Micael Adas, there is a cystic structure in the liver and Dr. Micael Adas recommends abdominal ultrasound to follow up. Patient verbalizes understanding and agrees to plan.

## 2023-07-15 NOTE — Telephone Encounter (Signed)
-----   Message from Gaylyn Keas sent at 07/11/2023 11:25 AM EST ----- Echo showed normal heart function with stable MV repair.  There is a cystic structure in the liver > ? Etiology.  Please get an abdominal US  for further evaluation

## 2023-08-05 ENCOUNTER — Other Ambulatory Visit: Payer: Self-pay | Admitting: Cardiology

## 2023-08-07 ENCOUNTER — Ambulatory Visit (HOSPITAL_COMMUNITY)
Admission: RE | Admit: 2023-08-07 | Discharge: 2023-08-07 | Disposition: A | Discharge status: Home / Self Care | Payer: Medicare PPO | Source: Ambulatory Visit | Attending: Cardiology | Admitting: Cardiology

## 2023-08-07 DIAGNOSIS — K7689 Other specified diseases of liver: Secondary | ICD-10-CM | POA: Diagnosis not present

## 2023-08-07 DIAGNOSIS — N281 Cyst of kidney, acquired: Secondary | ICD-10-CM | POA: Diagnosis not present

## 2023-08-07 DIAGNOSIS — I77811 Abdominal aortic ectasia: Secondary | ICD-10-CM | POA: Diagnosis not present

## 2023-08-14 ENCOUNTER — Telehealth: Payer: Self-pay

## 2023-08-14 DIAGNOSIS — K7689 Other specified diseases of liver: Secondary | ICD-10-CM

## 2023-08-14 DIAGNOSIS — N281 Cyst of kidney, acquired: Secondary | ICD-10-CM

## 2023-08-14 NOTE — Telephone Encounter (Signed)
-----   Message from Armanda Magic sent at 08/09/2023  8:02 PM EST ----- Abd US showed fatty liver, hepatic cyst and simple right cyst in kidney - please get patient in with Dr. Marca Ancona with [facility] for further evaluation

## 2023-08-14 NOTE — Telephone Encounter (Signed)
Abd US showed fatty liver, hepatic cyst and simple right cyst in kidney - please get patient in with Dr. Marca Ancona with [facility] for further evaluation

## 2023-08-27 ENCOUNTER — Telehealth: Payer: Self-pay | Admitting: Cardiology

## 2023-08-27 DIAGNOSIS — K760 Fatty (change of) liver, not elsewhere classified: Secondary | ICD-10-CM

## 2023-08-27 NOTE — Telephone Encounter (Signed)
 Spoke with pt and advised referral to Dr Marca Ancona has been submitted and sent to office staff to provide referral and send records.  Pt verbalizes understanding and thanked Charity fundraiser for the call.

## 2023-08-27 NOTE — Telephone Encounter (Signed)
 Pt calling for an update on Referral to [facility] - Dr. Marca Ancona. They do not use Epic, referral would have to be faxed. Please advise.   Fax: [phone]

## 2023-09-10 ENCOUNTER — Other Ambulatory Visit: Payer: Self-pay | Admitting: Student

## 2023-09-10 DIAGNOSIS — K760 Fatty (change of) liver, not elsewhere classified: Secondary | ICD-10-CM | POA: Diagnosis not present

## 2023-09-10 DIAGNOSIS — K7689 Other specified diseases of liver: Secondary | ICD-10-CM | POA: Diagnosis not present

## 2023-09-14 ENCOUNTER — Encounter: Payer: Self-pay | Admitting: Cardiology

## 2023-09-14 ENCOUNTER — Ambulatory Visit: Payer: Medicare PPO | Attending: Cardiology | Admitting: Cardiology

## 2023-09-14 ENCOUNTER — Other Ambulatory Visit: Payer: Self-pay

## 2023-09-14 VITALS — BP 124/68 | HR 68 | Resp 16 | Ht 72.0 in | Wt 207.6 lb

## 2023-09-14 DIAGNOSIS — I480 Paroxysmal atrial fibrillation: Secondary | ICD-10-CM | POA: Diagnosis not present

## 2023-09-14 DIAGNOSIS — R001 Bradycardia, unspecified: Secondary | ICD-10-CM

## 2023-09-14 DIAGNOSIS — I341 Nonrheumatic mitral (valve) prolapse: Secondary | ICD-10-CM | POA: Diagnosis not present

## 2023-09-14 DIAGNOSIS — I77810 Thoracic aortic ectasia: Secondary | ICD-10-CM | POA: Diagnosis not present

## 2023-09-14 DIAGNOSIS — I10 Essential (primary) hypertension: Secondary | ICD-10-CM

## 2023-09-14 DIAGNOSIS — E7800 Pure hypercholesterolemia, unspecified: Secondary | ICD-10-CM

## 2023-09-14 NOTE — Patient Instructions (Signed)
 Medication Instructions:  Your physician recommends that you continue on your current medications as directed. Please refer to the Current Medication list given to you today.  *If you need a refill on your cardiac medications before your next appointment, please call your pharmacy*  Lab Work: TODAY: CMET. CBC, and Lipids  Follow-Up: At [facility], you and your health needs are our priority.  As part of our continuing mission to provide you with exceptional heart care, we have created designated Provider Care Teams.  These Care Teams include your primary Cardiologist (physician) and Advanced Practice Providers (APPs -  Physician Assistants and Nurse Practitioners) who all work together to provide you with the care you need, when you need it.  Your next appointment:   1 year  Provider:   Armanda Magic, MD

## 2023-09-14 NOTE — Progress Notes (Signed)
 Cardiology Office Note:    Date:  09/14/2023   ID:  Chase Smith., DOB November 29, 1945, MRN [id number]  PCP:  Marden Noble, MD (Inactive)  Cardiologist:  Armanda Magic, MD    Referring MD: No ref. provider found   Chief Complaint  Patient presents with   Mitral Regurgitation   Atrial Fibrillation   Hypertension   Hyperlipidemia    History of Present Illness:    Chase Smith. is a 78 y.o. male with a hx of bileaflet mitral valve prolapse with mild MR, dilated aortic root, grade 2 diastolic dysfunction and PAF.  He is on Eliquis for a CHADS2VASC score of 4.   He was admitted with strep viridans bacteremia in July 2019.  2D echo 12/2017 showed normal LVF with EF 60-65% with severe holosystolic MVP of the posterior MV leaflet with severe MR that was eccentric.  Due to bacteremia patient underwent TEE which confirmed severe mitral prolapse of the PMLV with severe MR.  There was also an oscillating density that was felt to possibly be due to a ruptured cord but could not rule out small vegetation and patient was treated with a prolonged course of antibx for endocarditis.    Followup 2D echo 02/2018 showed normal LVF with flail segment of posterior MVL with moderate to severe anteriorly directed MR.  He was referred to structural heart team and underwent right and left heart cath showing normal coronary arteries and severe MR and underwent minimally invasive MV repair and MAZE procedure with LA appendage clipping 04/27/2018.  He has been maintaining NSR since then and is on Eliquis 5mg  BID for CHADS2VASC score of 2.  He is here today for followup and is doing well.  He denies any chest pain or pressure, SOB, DOE (except when biking), PND, orthopnea, LE edema, dizziness (except when bending over and standing up too fast), palpitations or syncope. He is compliant with his meds and is tolerating meds with no SE.     Past Medical History:  Diagnosis Date   Abdominal mass, RLQ (right lower  quadrant) 07/27/2012   Acquired dilation of ascending aorta and aortic root (HCC)    40mm ascending aorta and 41mm aortic root by echo 04/2021   Benign essential HTN 02/07/2015   BPH (benign prostatic hyperplasia)    mild   ED (erectile dysfunction) 08/2011   cancelled f/u with Urology   Epididymitis    recurrent   Finger laceration    left index finger   Hyperlipidemia    Hypertension    in the past - resolved   Left ventricular diastolic dysfunction, NYHA class 2    Meckel's diverticulitis 08/27/2012   MVP (mitral valve prolapse)    Bileaflet MVP with partially flail segment of the posterior leaflt with 4+ MR in setting of strep viridans bacteremia s/p minimally invasive MV repari   PAF (paroxysmal atrial fibrillation) (HCC) 05/13/2017   CHADS2VASC score is 2 (HTN and age > 31) on Apixaban   S/P minimally invasive maze operation for atrial fibrillation 04/27/2018   Complete bilateral atrial lesion set using cryothermy and bipolar radiofrequency ablation with clipping of LA appendage via right mini thoracotomy approach   S/P minimally invasive mitral valve repair 04/27/2018   Complex valvuloplasty including triangular resection of P2 segment of posterior leaflet, artificial Gore-tex neochord placement x8 and Sorin Memo 4D ring annulopasty via right mini thoracotomy approach   Vertigo    episodic mild positional    Past Surgical History:  Procedure Laterality Date   APPENDECTOMY     CARDIAC CATHETERIZATION  03/24/2018   CLIPPING OF ATRIAL APPENDAGE  04/27/2018   Procedure: CLIPPING OF ATRIAL APPENDAGE;  Surgeon: Purcell Nails, MD;  Location: [facility] OR;  Service: Open Heart Surgery;;   LAPAROSCOPIC APPENDECTOMY N/A 08/11/2012   Procedure: APPENDECTOMY LAPAROSCOPIC;  Surgeon: Almond Lint, MD;  Location: [facility] ORS;  Service: General;  Laterality: N/A;   LAPAROSCOPIC SMALL BOWEL RESECTION N/A 08/11/2012   Procedure: Diagnostic Laparoscopy,  Diverticulectomy and laparoscopic appendectomy;   Surgeon: Almond Lint, MD;  Location: [facility] ORS;  Service: General;  Laterality: N/A;  Diagnostic Laparoscopy, Small Bowel Resection vs Diverticulectomy    MINIMALLY INVASIVE MAZE PROCEDURE N/A 04/27/2018   Procedure: MINIMALLY INVASIVE MAZE PROCEDURE;  Surgeon: Purcell Nails, MD;  Location: [facility] OR;  Service: Open Heart Surgery;  Laterality: N/A;   MITRAL VALVE REPAIR Right 04/27/2018   Procedure: MINIMALLY INVASIVE MITRAL VALVE REPAIR (MVR);  Surgeon: Purcell Nails, MD;  Location: [facility] OR;  Service: Open Heart Surgery;  Laterality: Right;   NERVE, TENDON AND ARTERY REPAIR Left 10/28/2018   Procedure: TENDON, ARTERY, NERVE REPAIR LEFT INDEX FINGER, DEBRIDEMENT OF WOUND;  Surgeon: Cindee Salt, MD;  Location: [facility];  Service: Orthopedics;  Laterality: Left;   PILONIDAL CYST EXCISION     RECONSTRUCTION OF NOSE     secondary to deviated septum   RIGHT/LEFT HEART CATH AND CORONARY ANGIOGRAPHY N/A 03/24/2018   Procedure: RIGHT/LEFT HEART CATH AND CORONARY ANGIOGRAPHY;  Surgeon: Corky Crafts, MD;  Location: [facility] INVASIVE CV LAB;  Service: Cardiovascular;  Laterality: N/A;   TEE WITHOUT CARDIOVERSION N/A 08/08/2013   Procedure: TRANSESOPHAGEAL ECHOCARDIOGRAM (TEE);  Surgeon: Quintella Reichert, MD;  Location: [facility] ENDOSCOPY;  Service: Cardiovascular;  Laterality: N/A;   TEE WITHOUT CARDIOVERSION N/A 01/05/2018   Procedure: TRANSESOPHAGEAL ECHOCARDIOGRAM (TEE);  Surgeon: Lewayne Bunting, MD;  Location: [facility] ENDOSCOPY;  Service: Cardiovascular;  Laterality: N/A;   TEE WITHOUT CARDIOVERSION N/A 04/27/2018   Procedure: TRANSESOPHAGEAL ECHOCARDIOGRAM (TEE);  Surgeon: Purcell Nails, MD;  Location: [facility] OR;  Service: Open Heart Surgery;  Laterality: N/A;   TONSILLECTOMY      Current Medications: Current Meds  Medication Sig   acetaminophen (TYLENOL) 500 MG tablet Take 500-1,000 mg by mouth as needed for moderate pain or headache.    amoxicillin (AMOXIL) 500 MG capsule TAKE FOUR CAPSULES BY  MOUTH 30-60 minutes prior TO dental procedures   apixaban (ELIQUIS) 5 MG TABS tablet TAKE 1 TABLET BY MOUTH TWICE A DAY   carvedilol (COREG) 3.125 MG tablet TAKE 1 TABLET BY MOUTH TWICE A DAY WITH A MEAL   Cholecalciferol (VITAMIN D) 2000 UNITS tablet Take 2,000 Units daily by mouth.    Coenzyme Q10 (CO Q-10 PO) Take 1 capsule by mouth daily.   Cyanocobalamin (VITAMIN B12) 500 MCG TABS Take 500 mcg by mouth daily. Take for 5 days only.   flunisolide (NASALIDE) 25 MCG/ACT (0.025%) SOLN Place 2 sprays into the nose daily as needed (for allergies).    losartan (COZAAR) 25 MG tablet Take 25 mg by mouth daily.    pantoprazole (PROTONIX) 40 MG tablet Take 40 mg by mouth daily.   Polyvinyl Alcohol-Povidone PF 1.4-0.6 % SOLN Place 1 drop into both eyes as needed (for dry eyes).    rosuvastatin (CRESTOR) 5 MG tablet TAKE 1 TABLET (5 MG TOTAL) BY MOUTH EVERY MONDAY, WEDNESDAY, AND FRIDAY.   sildenafil (REVATIO) 20 MG tablet Take 20 mg by mouth as  needed.     Allergies:   Ace inhibitors; Sulfa antibiotics; and Antihistamines, chlorpheniramine-type   Social History   Socioeconomic History   Marital status: Married    Spouse name: Not on file   Number of children: Not on file   Years of education: Not on file   Highest education level: Not on file  Occupational History   Not on file  Tobacco Use   Smoking status: Never   Smokeless tobacco: Never  Vaping Use   Vaping status: Never Used  Substance and Sexual Activity   Alcohol use: Yes    Comment: one drink per day (wine or beer)   Drug use: No   Sexual activity: Yes  Other Topics Concern   Not on file  Social History Narrative   Not on file   Social Drivers of Health   Financial Resource Strain: Not on file  Food Insecurity: Not on file  Transportation Needs: Not on file  Physical Activity: Not on file  Stress: Not on file  Social Connections: Not on file     Family History: The patient's family history includes Cancer in his  father, maternal aunt, maternal uncle, mother, and paternal uncle; Heart failure in his father.  ROS:   Please see the history of present illness.    ROS  All other systems reviewed and negative.   EKGs/Labs/Other Studies Reviewed:    The following studies were reviewed today:  EKG Interpretation Date/Time:  Monday September 14 2023 15:05:09 EDT Ventricular Rate:  68 PR Interval:  206 QRS Duration:  80 QT Interval:  408 QTC Calculation: 433 R Axis:   33  Text Interpretation: Sinus rhythm with marked sinus arrhythmia When compared with ECG of 16-Apr-2020 13:55, No significant change was found Confirmed by Armanda Magic ([id number]) on 09/14/2023 3:24:45 PM    2D echo 03/2020 IMPRESSIONS    1. There is an echolucent structure in the liver. Would recommend  dedicated liver ultrasound for better characaterization.   2. Left ventricular ejection fraction, by estimation, is 60 to 65%. The  left ventricle has normal function. The left ventricle has no regional  wall motion abnormalities. Left ventricular diastolic function could not  be evaluated.   3. Right ventricular systolic function is mildly reduced. The right  ventricular size is normal. Tricuspid regurgitation signal is inadequate  for assessing PA pressure.   4. The mitral valve has been repaired/replaced. No evidence of mitral  valve regurgitation. No evidence of mitral stenosis. The mean mitral valve  gradient is 3.1 mmHg with average heart rate of 67 bpm. There is a 34mm  Sorin Memo 4D Ring annuloplasty  present in the mitral position. Procedure Date: 04/27/2018. Echo findings  are consistent with normal structure and function of the mitral valve  prosthesis.   5. The aortic valve is tricuspid. Aortic valve regurgitation is not  visualized. No aortic stenosis is present.   Comparison(s): No significant change from prior study. 01/07/19 EF 60-65%.  MV mean PG.    Recent Labs: No results found for requested labs within  last 365 days.   Recent Lipid Panel    Component Value Date/Time   CHOL 154 07/20/2020 1133   TRIG 113 07/20/2020 1133   HDL 48 07/20/2020 1133   CHOLHDL 3.2 07/20/2020 1133   LDLCALC 86 07/20/2020 1133    Physical Exam:    VS:  BP 124/68 (BP Location: Left Arm, Patient Position: Sitting, Cuff Size: Normal)   Pulse 68  Resp 16   Ht 6' (1.829 m)   Wt 207 lb 9.6 oz (94.2 kg)   SpO2 96%   BMI 28.16 kg/m     Wt Readings from Last 3 Encounters:  09/14/23 207 lb 9.6 oz (94.2 kg)  07/11/22 202 lb 12.8 oz (92 kg)  07/20/20 200 lb (90.7 kg)    GEN: Well nourished, well developed in no acute distress HEENT: Normal NECK: No JVD; No carotid bruits LYMPHATICS: No lymphadenopathy CARDIAC:RRR, no murmurs, rubs, gallops RESPIRATORY:  Clear to auscultation without rales, wheezing or rhonchi  ABDOMEN: Soft, non-tender, non-distended MUSCULOSKELETAL:  No edema; No deformity  SKIN: Warm and dry NEUROLOGIC:  Alert and oriented x 3 PSYCHIATRIC:  Normal affect  ASSESSMENT:    1. MVP (mitral valve prolapse)   2. Benign essential HTN   3. Paroxysmal atrial fibrillation (HCC)   4. Bradycardia   5. Ascending aorta dilatation (HCC)   6. Pure hypercholesterolemia     PLAN:    In order of problems listed above:  1.  Severe MVP with flail chordae and severe MR -s/p MV repair after bacterial endocarditis in 2019 -2D echo 03/2020 showed stable MV repair with  mean gradient with no MR -2D echo 05/2022 showed stable MV repair with mean MVG and no MR -2D echo 07/20/2023 showed stable mitral valve repair with trivial MR and mean mitral valve gradient 3 mmHg  2.  HTN -BP is adequately controlled on exam today continue -continue Prescription drug management with carvedilol 3.125 mg twice daily and losartan 25 mg daily with as needed refills -check BMET today  3.  PAF -s/p MAZE at time of MV repair -He remains in normal sinus rhythm and denies any palpitations -No bleeding  problems on Eliquis -Continue prescription drug management with apixaban 5 mg twice daily, carvedilol 3.25 mg twice daily with as needed refills -Check CBC and bmet  4.  Bradycardia -resolved after decreasing carvedilol  5. Dilated ascending aorta and aortic root -2D echo 05/2022 4 cm and 3.9 cm respectively -2D echo 07/20/2023 showed aortic root 4 cm and ascending aorta 4 cm -Repeat 2D echo in 1 year -continue statin -BP remains controlled  6.  HLD -LDL goal < 100 but preferably < 100 -I have personally reviewed and interpreted outside labs performed by patient's PCP which showed LDL 96 and HDL 51 on 12/18/2022 -Repeat FLP and ALT -Continue prescription drug management with Crestor 5 mg Monday Wednesday and Friday  Followup with me in 1 year  Medication Adjustments/Labs and Tests Ordered: Current medicines are reviewed at length with the patient today.  Concerns regarding medicines are outlined above.  Orders Placed This Encounter  Procedures   EKG 12-Lead   No orders of the defined types were placed in this encounter.   Signed, Armanda Magic, MD  09/14/2023 3:23 PM    [facility]

## 2023-09-14 NOTE — Addendum Note (Signed)
 Addended by: Frutoso Schatz on: 09/14/2023 03:29 PM   Modules accepted: Orders

## 2023-09-15 ENCOUNTER — Encounter (HOSPITAL_BASED_OUTPATIENT_CLINIC_OR_DEPARTMENT_OTHER): Payer: Self-pay

## 2023-09-15 LAB — COMPREHENSIVE METABOLIC PANEL
ALT: 17 IU/L (ref 0–44)
AST: 22 IU/L (ref 0–40)
Albumin: 4.4 g/dL (ref 3.8–4.8)
Alkaline Phosphatase: 75 IU/L (ref 44–121)
BUN/Creatinine Ratio: 22 (ref 10–24)
BUN: 21 mg/dL (ref 8–27)
Bilirubin Total: 0.7 mg/dL (ref 0.0–1.2)
CO2: 21 mmol/L (ref 20–29)
Calcium: 9.3 mg/dL (ref 8.6–10.2)
Chloride: 104 mmol/L (ref 96–106)
Creatinine, Ser: 0.96 mg/dL (ref 0.76–1.27)
Globulin, Total: 1.9 g/dL (ref 1.5–4.5)
Glucose: 80 mg/dL (ref 70–99)
Potassium: 4.8 mmol/L (ref 3.5–5.2)
Sodium: 141 mmol/L (ref 134–144)
Total Protein: 6.3 g/dL (ref 6.0–8.5)
eGFR: 82 mL/min/{1.73_m2} (ref >59)

## 2023-09-15 LAB — LIPID PANEL
Chol/HDL Ratio: 3.2 ratio (ref 0.0–5.0)
Cholesterol, Total: 167 mg/dL (ref 100–199)
HDL: 52 mg/dL (ref >39)
LDL Chol Calc (NIH): 96 mg/dL (ref 0–99)
Triglycerides: 106 mg/dL (ref 0–149)
VLDL Cholesterol Cal: 19 mg/dL (ref 5–40)

## 2023-10-09 DIAGNOSIS — J329 Chronic sinusitis, unspecified: Secondary | ICD-10-CM | POA: Diagnosis not present

## 2023-10-09 DIAGNOSIS — R5383 Other fatigue: Secondary | ICD-10-CM | POA: Diagnosis not present

## 2023-11-06 ENCOUNTER — Other Ambulatory Visit: Payer: Self-pay | Admitting: Cardiology

## 2023-12-07 ENCOUNTER — Other Ambulatory Visit: Payer: Self-pay | Admitting: Student

## 2023-12-07 DIAGNOSIS — K760 Fatty (change of) liver, not elsewhere classified: Secondary | ICD-10-CM | POA: Diagnosis not present

## 2023-12-07 DIAGNOSIS — K7689 Other specified diseases of liver: Secondary | ICD-10-CM | POA: Diagnosis not present

## 2023-12-09 ENCOUNTER — Ambulatory Visit
Admission: RE | Admit: 2023-12-09 | Discharge: 2023-12-09 | Discharge status: Home / Self Care | Source: Ambulatory Visit | Attending: Student

## 2023-12-09 DIAGNOSIS — K7689 Other specified diseases of liver: Secondary | ICD-10-CM

## 2023-12-09 DIAGNOSIS — K769 Liver disease, unspecified: Secondary | ICD-10-CM | POA: Diagnosis not present

## 2023-12-23 DIAGNOSIS — D6869 Other thrombophilia: Secondary | ICD-10-CM | POA: Diagnosis not present

## 2023-12-23 DIAGNOSIS — K760 Fatty (change of) liver, not elsewhere classified: Secondary | ICD-10-CM | POA: Diagnosis not present

## 2023-12-23 DIAGNOSIS — Z79899 Other long term (current) drug therapy: Secondary | ICD-10-CM | POA: Diagnosis not present

## 2023-12-23 DIAGNOSIS — M179 Osteoarthritis of knee, unspecified: Secondary | ICD-10-CM | POA: Diagnosis not present

## 2023-12-23 DIAGNOSIS — K9089 Other intestinal malabsorption: Secondary | ICD-10-CM | POA: Diagnosis not present

## 2023-12-23 DIAGNOSIS — E785 Hyperlipidemia, unspecified: Secondary | ICD-10-CM | POA: Diagnosis not present

## 2023-12-23 DIAGNOSIS — I4891 Unspecified atrial fibrillation: Secondary | ICD-10-CM | POA: Diagnosis not present

## 2023-12-23 DIAGNOSIS — E559 Vitamin D deficiency, unspecified: Secondary | ICD-10-CM | POA: Diagnosis not present

## 2023-12-23 DIAGNOSIS — K7689 Other specified diseases of liver: Secondary | ICD-10-CM | POA: Diagnosis not present

## 2023-12-23 DIAGNOSIS — Z125 Encounter for screening for malignant neoplasm of prostate: Secondary | ICD-10-CM | POA: Diagnosis not present

## 2023-12-23 DIAGNOSIS — I10 Essential (primary) hypertension: Secondary | ICD-10-CM | POA: Diagnosis not present

## 2023-12-23 DIAGNOSIS — Z0000 Encounter for general adult medical examination without abnormal findings: Secondary | ICD-10-CM | POA: Diagnosis not present

## 2024-02-01 DIAGNOSIS — D485 Neoplasm of uncertain behavior of skin: Secondary | ICD-10-CM | POA: Diagnosis not present

## 2024-02-01 DIAGNOSIS — D0462 Carcinoma in situ of skin of left upper limb, including shoulder: Secondary | ICD-10-CM | POA: Diagnosis not present

## 2024-02-01 DIAGNOSIS — L821 Other seborrheic keratosis: Secondary | ICD-10-CM | POA: Diagnosis not present

## 2024-02-01 DIAGNOSIS — L570 Actinic keratosis: Secondary | ICD-10-CM | POA: Diagnosis not present

## 2024-02-01 DIAGNOSIS — Z85828 Personal history of other malignant neoplasm of skin: Secondary | ICD-10-CM | POA: Diagnosis not present

## 2024-04-06 DIAGNOSIS — H2513 Age-related nuclear cataract, bilateral: Secondary | ICD-10-CM | POA: Diagnosis not present

## 2024-04-06 DIAGNOSIS — H353132 Nonexudative age-related macular degeneration, bilateral, intermediate dry stage: Secondary | ICD-10-CM | POA: Diagnosis not present

## 2024-04-06 DIAGNOSIS — H1789 Other corneal scars and opacities: Secondary | ICD-10-CM | POA: Diagnosis not present

## 2024-04-06 DIAGNOSIS — H5203 Hypermetropia, bilateral: Secondary | ICD-10-CM | POA: Diagnosis not present

## 2024-05-03 DIAGNOSIS — H18452 Nodular corneal degeneration, left eye: Secondary | ICD-10-CM | POA: Diagnosis not present

## 2024-06-10 DIAGNOSIS — K219 Gastro-esophageal reflux disease without esophagitis: Secondary | ICD-10-CM | POA: Diagnosis not present

## 2024-06-10 DIAGNOSIS — K760 Fatty (change of) liver, not elsewhere classified: Secondary | ICD-10-CM | POA: Diagnosis not present
